# Patient Record
Sex: Female | Born: 1935
Health system: Southern US, Community
[De-identification: ages and names within clinical notes are randomized; demographics above are authoritative.]

## PROBLEM LIST (undated history)

## (undated) DIAGNOSIS — R17 Unspecified jaundice: Secondary | ICD-10-CM

## (undated) DIAGNOSIS — K831 Obstruction of bile duct: Secondary | ICD-10-CM

## (undated) DIAGNOSIS — R634 Abnormal weight loss: Secondary | ICD-10-CM

## (undated) DIAGNOSIS — K219 Gastro-esophageal reflux disease without esophagitis: Secondary | ICD-10-CM

## (undated) DIAGNOSIS — I509 Heart failure, unspecified: Secondary | ICD-10-CM

## (undated) DIAGNOSIS — E039 Hypothyroidism, unspecified: Secondary | ICD-10-CM

## (undated) DIAGNOSIS — D869 Sarcoidosis, unspecified: Secondary | ICD-10-CM

## (undated) DIAGNOSIS — R630 Anorexia: Secondary | ICD-10-CM

## (undated) HISTORY — DX: Abnormal weight loss: R63.4

## (undated) HISTORY — DX: Unspecified jaundice: R17

## (undated) HISTORY — PX: APPENDECTOMY: SHX54

## (undated) HISTORY — DX: Obstruction of bile duct: K83.1

## (undated) HISTORY — DX: Anorexia: R63.0

## (undated) HISTORY — PX: VAGINA SURGERY: SHX829

---

## 2010-10-07 ENCOUNTER — Other Ambulatory Visit (INDEPENDENT_AMBULATORY_CARE_PROVIDER_SITE_OTHER): Payer: Self-pay | Admitting: Internal Medicine

## 2010-10-07 ENCOUNTER — Ambulatory Visit (HOSPITAL_COMMUNITY)
Admission: RE | Admit: 2010-10-07 | Discharge: 2010-10-07 | Disposition: A | Discharge status: Home / Self Care | Payer: Medicare Other | Source: Ambulatory Visit | Attending: Internal Medicine | Admitting: Internal Medicine

## 2010-10-07 ENCOUNTER — Ambulatory Visit (INDEPENDENT_AMBULATORY_CARE_PROVIDER_SITE_OTHER): Payer: Medicare Other | Admitting: Internal Medicine

## 2010-10-07 ENCOUNTER — Encounter (HOSPITAL_COMMUNITY): Payer: Medicare Other

## 2010-10-07 DIAGNOSIS — Z01811 Encounter for preprocedural respiratory examination: Secondary | ICD-10-CM

## 2010-10-07 DIAGNOSIS — K839 Disease of biliary tract, unspecified: Secondary | ICD-10-CM

## 2010-10-07 LAB — HEPATIC FUNCTION PANEL
AST: 255 U/L — ABNORMAL HIGH (ref 0–37)
Bilirubin, Direct: 3.2 mg/dL — ABNORMAL HIGH (ref 0.0–0.3)
Total Bilirubin: 4.5 mg/dL — ABNORMAL HIGH (ref 0.3–1.2)

## 2010-10-07 LAB — PROTIME-INR: Prothrombin Time: 14.2 seconds (ref 11.6–15.2)

## 2010-10-08 ENCOUNTER — Ambulatory Visit (HOSPITAL_COMMUNITY)
Admission: RE | Admit: 2010-10-08 | Discharge: 2010-10-08 | Disposition: A | Discharge status: Home / Self Care | Payer: Medicare Other | Source: Ambulatory Visit | Attending: Internal Medicine | Admitting: Internal Medicine

## 2010-10-08 ENCOUNTER — Other Ambulatory Visit (HOSPITAL_BASED_OUTPATIENT_CLINIC_OR_DEPARTMENT_OTHER): Payer: Medicare Other | Admitting: Internal Medicine

## 2010-10-08 ENCOUNTER — Ambulatory Visit (HOSPITAL_COMMUNITY): Payer: Medicare Other

## 2010-10-08 DIAGNOSIS — J449 Chronic obstructive pulmonary disease, unspecified: Secondary | ICD-10-CM | POA: Insufficient documentation

## 2010-10-08 DIAGNOSIS — J4489 Other specified chronic obstructive pulmonary disease: Secondary | ICD-10-CM | POA: Insufficient documentation

## 2010-10-08 DIAGNOSIS — K831 Obstruction of bile duct: Secondary | ICD-10-CM

## 2010-10-08 DIAGNOSIS — R110 Nausea: Secondary | ICD-10-CM | POA: Insufficient documentation

## 2010-10-08 DIAGNOSIS — E119 Type 2 diabetes mellitus without complications: Secondary | ICD-10-CM | POA: Insufficient documentation

## 2010-10-08 DIAGNOSIS — K838 Other specified diseases of biliary tract: Secondary | ICD-10-CM

## 2010-10-08 DIAGNOSIS — Z01812 Encounter for preprocedural laboratory examination: Secondary | ICD-10-CM | POA: Insufficient documentation

## 2010-10-08 DIAGNOSIS — R17 Unspecified jaundice: Secondary | ICD-10-CM | POA: Insufficient documentation

## 2010-10-08 HISTORY — PX: ERCP: SHX60

## 2010-11-01 ENCOUNTER — Ambulatory Visit (INDEPENDENT_AMBULATORY_CARE_PROVIDER_SITE_OTHER): Payer: Medicare Other | Admitting: Internal Medicine

## 2010-11-01 DIAGNOSIS — K831 Obstruction of bile duct: Secondary | ICD-10-CM

## 2010-11-01 NOTE — Consult Note (Signed)
NAMESEQUOYAH, RAMONE                ACCOUNT NO.:  [id number]  MEDICAL RECORD NO.:  [id number]  LOCATION:                                 FACILITY:  PHYSICIAN:  Lionel December, M.D.    DATE OF BIRTH:  08-04-1929  DATE OF CONSULTATION:  10/07/2010 DATE OF DISCHARGE:                                CONSULTATION   REASON FOR CONSULTATION:  Obstructive jaundice.  HISTORY OF PRESENT ILLNESS:  Piya is a 77 year old Caucasian female who is referred through courtesy of Dr. Donzetta Sprung for evaluation of obstructive jaundice.  She is accompanied by her son, Everline Mahaffy and daughter-in-law, Orah Sonnen, who is an Charity fundraiser at [employer].  The patient's present illness began about 3 weeks ago when she fell. Since then, she has had some few good days but a lot of bad days. Initially, she was on a pain medication but she has stopped the Percocet and then she was on tramadol and she has discontinued tramadol as well. She has been experiencing nausea and belching and she has had multiple episodes of heaving.  She did notice some vague abdominal pain in the right lower quadrant.  She also has noted rumbling and rolling of her abdomen.  A few days ago, her urine was dark, but now is back to normal. She says her stool has been greenish color.  She has noted drop in her appetite and in the last 3 weeks she has lost 6 pounds.  She has not experienced any fever or chills.  She also denies melena or rectal bleeding.  The patient was seen by Dr. Donzetta Sprung on Sep 29, 2010 and she was noted to be jaundiced.  He therefore did lab studies and noted that she had elevated bilirubin, alkaline phosphatase, and transaminases.  She had abdominopelvic CT on October 06, 2010, which revealed markedly dilated biliary system which is reviewed below.  She also had chest CT yesterday.  Dr. Reuel Boom felt that she needed urgent evaluation for possible ERCP.  The patient states that she has been feeling very weak.  She says she has  been very active until this fall.  She did not experience any fracture though.  She did have x-rays and noted to have fracture to one of her vertebra, but this was felt to be old.  CURRENT MEDICATIONS: 1. Glucotrol XL 5 mg p.o. q.a.m. 2. Synthroid 75 mcg p.o. daily. 3. Voltaren gel to be applied to her back p.r.n. 4. Tylenol OTC p.r.n. 5. Pepto-Bismol p.r.n.Marland Kitchen  PAST MEDICAL HISTORY:  Hypothyroidism was diagnosed about 40 years ago. She was diagnosed with pulmonary sarcoidosis 15-20 years, has remained in remission.  She has been diabetic for about 5 years.  She has osteoporosis, presently on calcium and vitamin D which she is not taking at the present time.  She fell and sustained pelvic fracture 20 years ago but did not require any surgery.  Somewhere along the line, she also developed fracture to superior endplate of T12 which was not seen on a chest CT of January 07, 2011.  PAST SURGICAL HISTORY:  Suspension of her uterus when she was 77 years old.  ALLERGIES:  SULFA causing rash.  FAMILY HISTORY:  Mother lived to be 45 and father died of stroke in his 53s.  She has a sister age 58, is diabetic and hypertensive and in fair condition.  Three sisters are diseased, one had breast carcinoma diagnosed in her 30s but lived to be 32.  Two other sisters lived to be in their 22s.  She has a brother aged 22 who is diabetic.  Another brother died in his 1s.  He was diabetic and had a stroke.  SOCIAL HISTORY:  She is married.  She has 3 children.  Two were born premature and died.  The ones who are living generally are doing well. One of her sons, [name], is here with her today.  She works at [employer] for 30 years, but she has been retired for few years.  She has never smoked cigarettes or drank alcohol.  OBJECTIVE:  VITAL SIGNS:  Weight 96 pounds.  She is 5 feet tall.  Pulse 68 per minute, blood pressure 118/70, temperature is 97.6. HEENT:  Conjunctivae are pink.  Sclerae  are mildly icteric.  Oral pharyngeal mucosa is normal.  She has dentures in place.  Her hard palate which is hidden by dentures is icteric. NECK:  No neck masses or thyromegaly noted. CARDIAC:  Regular rhythm.  Normal S1 and S2. LUNGS:  Clear to auscultation.  She has chest deformity with an increased AP diameter. ABDOMEN:  Flat.  Bowel sounds are hyperactive.  She has abdominal bruit. Liver edge is 7-8 cm below right costal margin.  Gallbladder is palpated separate from the margin, span is 14 cm.  Spleen is not palpable. EXTREMITIES:  Thin, but no clubbing or edema noted.  LABORATORY DATA:  From Sep 29, 2010; WBC 5.7, H and H 13.4 and 41.3, MCV 95, and platelet count 436,000.  Serum amylase 18, lipase 9, glucose 256, BUN 7, creatinine 0.77.  Sodium 133, potassium 4.4, chloride 90, CO2 of 25, calcium 9.4.  Total bilirubin 4.2, AP 453, SGOT 44, SGPT 68, total protein 7.3 with albumin of 4.0.  Abdominopelvic CT with contrast performed at [facility] on October 06, 2010.  It shows very large intrahepatic biliary radicals as well as dilated common hepatic duct and common bile duct down to level of ampulla.  Gallbladder is also markedly distended.  Pancreas is atrophic, but there is no definite mass identified.  This study also showed large hiatal hernia, bilateral renal cysts, old T2 compression fracture, and diverticulosis.  The patient had preop chest film today which shows extensive pulmonary distortion in the upper lobes with elevation left hilum and nodular linear opacities.  These changes are felt to be due to history of sarcoidosis.  ASSESSMENT:  Yelina is a 77 year old Caucasian female who presents with 3- week history of nausea, anorexia, heaving, and 6-pound weight loss and she also developed dark urine.  She has not experienced any fever, chills, or pruritus.  She did have vague abdominal pain but now is pain- free.  Lab studies revealed that she has elevated bilirubin,  alkaline phosphatase, and transaminases.  Pattern is cholestatic.  She has markedly dilated intra and extrahepatic biliary system down to the level of ampulla.  There is no mass and head of pancreas which appears atrophic.  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  However, I would favor the first diagnosis considering that her bilirubin is not very high as one  would expect with a malignant process where obstruction generally becomes complete.  RECOMMENDATIONS:  Preop evaluation at [facility] (already done).  She will undergo ERCP with sphincterotomy and biliary stenting if indicated and she may also need cholangioscopy or SpyGlass exam.  I have reviewed the procedure and risks in detail with the patient and her son and daughter-in-law and that they are all in agreement.  We will repeat her LFTs and check her INR.  She will also be given antimicrobial prophylaxis with ampicillin and Cipro.  ERCP will be performed on October 08, 2010.  Further recommendations to follow.  We appreciate the opportunity to participate in the care of this nice lady.          ______________________________ Lionel December, M.D.     NR/MEDQ  D:  10/07/2010  T:  10/08/2010  Job:  [id number]  cc:   Donzetta Sprung, MD Fax: [phone]  Electronically Signed by Lionel December M.D. on 11/01/2010 12:27:33 AM

## 2010-11-01 NOTE — Op Note (Signed)
NAMEBELLINA, TOKARCZYK                ACCOUNT NO.:  [id number]  MEDICAL RECORD NO.:  [id number]  LOCATION:  [facility]                          FACILITY:  [facility]  PHYSICIAN:  Lionel December, M.D.    DATE OF BIRTH:  1930/02/20  DATE OF PROCEDURE:  10/08/2010 DATE OF DISCHARGE:                              OPERATIVE REPORT   PROCEDURE:  ERCP with biliary sphincterotomy, cholangioscopy, and placement of biliary stent.  INDICATIONS:  Conny is 77 year old Caucasian female who presents with nausea, anorexia, weight loss, jaundice, and she is noted to have markedly dilated biliary system.  No mass identified and atrophic- appearing pancreas.  Procedure risks were reviewed with the patient and also talked with her son and daughter-in-law and they were all in agreement.  MEDS FOR SEDATION/ANESTHESIA:  Please see anesthesia records for details.  FINDINGS:  Procedure performed in the OR.  Once the patient was placed under anesthesia intubated, she was turned into semi-prone position and therapeutic Pentax video duodenoscope was passed through oropharynx without any difficulty into esophagus and across the stomach and pylorus into bulb and descending duodenum.  A very prominent ampulla Vater with fairly long intramural segment and a periampullary diverticulum on the right side.  Ampullary orifice was very tiny, was small.  CBD was cannulated with the help of the guide wire 0.35 Hydra Jagwire and RX44 autotome.  Dilute contrast was injected into the bile duct.  CBD and CHD were very dilated.  Maximal diameter was about 40 mm.  Some of the contrast spilled into cystic duct which was also quite large and convoluted.  Intrahepatic biliary radicals were only partially filled. Sphincterotomy was performed with gush of dark bile, mucopurulent material, as well as small pigment stone.  Distal segment of bile duct did not fill very well.  Therefore, cholangioscopy was performed using SpyGlass system.  Part  of the common hepatic and bile duct that was seen appeared to be normal, but visualization was suboptimal because of the opaque fluid and markedly dilated system.  I was not able to see the distal most segment of the duct.  Therefore proceeded with placement of biliary stent.  Using one-step Microvasive system, 10-French 7 cm long stent was deployed into the bile duct.  As the stent was disengaged, there was flow of fluid and contrast in the duodenum.  Endoscope was withdrawn.  The patient tolerated the procedure well.  She was extubated and taken to PACU.  FINAL DIAGNOSES: 1. Markedly dilated biliary system with patent cystic duct.  Distal     segment of the bile duct was not well seen.  Biliary sphincterotomy     performed with escape of mucopurulent and black bowel as well as     pigment stone. 2. Distal end of the bile duct not well seen with cholangioscopy.     Biliary stent left in place for decompression. 3. Suspect, she has papillary stenosis, although I cannot rule out the     small tumor in the distal bile duct.  RECOMMENDATIONS: 1. Clear liquids today.  She will resume her usual diet and meds in     a.m. 2. Cipro 500 mg p.o. b.i.d. for 5 days.  Please note she was also     given ampicillin and Cipro. 3. Zofran 4 mg p.o. b.i.d. p.r.n. for nausea, prescription given for     12. 4. The patient advised to watch for the stent and if she passes     spontaneously, she will let us know.  She will return for office visit in 4 weeks and she will have LFTs prior to that visit.          ______________________________ Lionel December, M.D.     NR/MEDQ  D:  10/08/2010  T:  10/08/2010  Job:  [id number]  cc:   Donzetta Sprung, MD Fax: [phone]  Electronically Signed by Lionel December M.D. on 11/01/2010 12:27:10 AM

## 2010-11-18 ENCOUNTER — Encounter (INDEPENDENT_AMBULATORY_CARE_PROVIDER_SITE_OTHER): Payer: Self-pay | Admitting: Internal Medicine

## 2010-11-18 ENCOUNTER — Telehealth (INDEPENDENT_AMBULATORY_CARE_PROVIDER_SITE_OTHER): Payer: Self-pay | Admitting: Internal Medicine

## 2010-11-18 NOTE — Telephone Encounter (Deleted)
Gastroenterology Pre-Procedure Form  Misty [name] is a 77 y.o., female who was referred to our office by Dr. Marland Kitchen on *** for a(n) {PROCEDURES; GI SINGULAR:210111023}.   The reason for this referral is {GI COLONOSCOPY INDICATIONS:110011}  Patient responded to the following questions as indicated:   1. Are you having any problems now? {response; yes (wildcard)/no:311194} 2. Do you have any family history of colon cancer? {response; yes (wildcard)/no:311194} 3. Have you ever had a colonoscopy before? {response; yes (wildcard)/no:311194}  Patient {Is/is not:9024} appropriate to continue this phone triage screening for the requested procedure(s).    ***

## 2010-11-18 NOTE — Telephone Encounter (Signed)
A user error has taken place: encounter opened in error, closed for administrative reasons.

## 2010-11-29 ENCOUNTER — Other Ambulatory Visit (INDEPENDENT_AMBULATORY_CARE_PROVIDER_SITE_OTHER): Payer: Self-pay | Admitting: Internal Medicine

## 2010-11-29 ENCOUNTER — Ambulatory Visit (HOSPITAL_COMMUNITY)
Admission: RE | Admit: 2010-11-29 | Discharge: 2010-11-29 | Disposition: A | Discharge status: Home / Self Care | Payer: Medicare Other | Source: Ambulatory Visit | Attending: Internal Medicine | Admitting: Internal Medicine

## 2010-11-29 DIAGNOSIS — Z09 Encounter for follow-up examination after completed treatment for conditions other than malignant neoplasm: Secondary | ICD-10-CM | POA: Insufficient documentation

## 2010-11-29 DIAGNOSIS — K831 Obstruction of bile duct: Secondary | ICD-10-CM

## 2010-12-22 ENCOUNTER — Encounter (INDEPENDENT_AMBULATORY_CARE_PROVIDER_SITE_OTHER): Payer: Self-pay

## 2010-12-30 ENCOUNTER — Other Ambulatory Visit (INDEPENDENT_AMBULATORY_CARE_PROVIDER_SITE_OTHER): Payer: Self-pay | Admitting: Internal Medicine

## 2010-12-30 ENCOUNTER — Ambulatory Visit (HOSPITAL_COMMUNITY)
Admission: RE | Admit: 2010-12-30 | Discharge: 2010-12-30 | Disposition: A | Discharge status: Home / Self Care | Payer: Medicare Other | Source: Ambulatory Visit | Attending: Internal Medicine | Admitting: Internal Medicine

## 2010-12-30 DIAGNOSIS — Z09 Encounter for follow-up examination after completed treatment for conditions other than malignant neoplasm: Secondary | ICD-10-CM | POA: Insufficient documentation

## 2010-12-30 DIAGNOSIS — Z955 Presence of coronary angioplasty implant and graft: Secondary | ICD-10-CM

## 2010-12-30 DIAGNOSIS — Z4682 Encounter for fitting and adjustment of non-vascular catheter: Secondary | ICD-10-CM | POA: Insufficient documentation

## 2011-01-05 ENCOUNTER — Telehealth (INDEPENDENT_AMBULATORY_CARE_PROVIDER_SITE_OTHER): Payer: Self-pay | Admitting: *Deleted

## 2011-01-05 DIAGNOSIS — T85590A Other mechanical complication of bile duct prosthesis, initial encounter: Secondary | ICD-10-CM

## 2011-01-05 NOTE — Telephone Encounter (Addendum)
Per Dr Karilyn Cota patient need ERCP w/ stent removal  ERCP sch'd 02/16/11 @ 7:30 (6:30)  Pre-op visit 02/09/11 @ 11:00  Patient's daughter-in-law, Olegario Messier, is aware of appts

## 2011-01-31 ENCOUNTER — Other Ambulatory Visit (INDEPENDENT_AMBULATORY_CARE_PROVIDER_SITE_OTHER): Payer: Self-pay | Admitting: *Deleted

## 2011-01-31 DIAGNOSIS — K831 Obstruction of bile duct: Secondary | ICD-10-CM

## 2011-02-01 MED ORDER — SODIUM CHLORIDE 0.9 % IV SOLN: 2.0000 g | Freq: Four times a day (QID) | INTRAVENOUS | Status: DC

## 2011-02-07 ENCOUNTER — Telehealth (INDEPENDENT_AMBULATORY_CARE_PROVIDER_SITE_OTHER): Payer: Self-pay | Admitting: *Deleted

## 2011-02-07 NOTE — Telephone Encounter (Signed)
Misty [name] has called back and wants to ask again what is about the IV antix.  The hospital called her Friday and asked her if she was on an IV antibx.  Also she wanted to know if she could do a abd xray to check on Wednesday when she goes for Pre-op to see if she has passed the stent before she has this procedure.  Really hopes she has and won't need this procedure.

## 2011-02-07 NOTE — Telephone Encounter (Signed)
Pharmacy called to get patient's med list prior to pre-op, spokt to Dr Karilyn Cota & he is agreeable for patient to have abd xray to check stent placement prior to pre-op, Lynden Ang is aware

## 2011-02-07 NOTE — Telephone Encounter (Signed)
He called Friday and said that the [facility] had called and asked about her being on an antibx.

## 2011-02-08 ENCOUNTER — Ambulatory Visit (HOSPITAL_COMMUNITY)
Admission: RE | Admit: 2011-02-08 | Discharge: 2011-02-08 | Disposition: A | Discharge status: Home / Self Care | Payer: Medicare Other | Source: Ambulatory Visit | Attending: Internal Medicine | Admitting: Internal Medicine

## 2011-02-08 ENCOUNTER — Other Ambulatory Visit (INDEPENDENT_AMBULATORY_CARE_PROVIDER_SITE_OTHER): Payer: Self-pay | Admitting: Internal Medicine

## 2011-02-08 DIAGNOSIS — T85590A Other mechanical complication of bile duct prosthesis, initial encounter: Secondary | ICD-10-CM

## 2011-02-08 DIAGNOSIS — Z4682 Encounter for fitting and adjustment of non-vascular catheter: Secondary | ICD-10-CM | POA: Insufficient documentation

## 2011-02-08 DIAGNOSIS — Z09 Encounter for follow-up examination after completed treatment for conditions other than malignant neoplasm: Secondary | ICD-10-CM | POA: Insufficient documentation

## 2011-02-09 ENCOUNTER — Encounter (HOSPITAL_COMMUNITY): Payer: Self-pay

## 2011-02-09 ENCOUNTER — Encounter (HOSPITAL_COMMUNITY)
Admission: RE | Admit: 2011-02-09 | Discharge: 2011-02-09 | Disposition: A | Discharge status: Home / Self Care | Payer: Medicare Other | Source: Ambulatory Visit | Attending: Internal Medicine | Admitting: Internal Medicine

## 2011-02-09 HISTORY — DX: Hypothyroidism, unspecified: E03.9

## 2011-02-09 HISTORY — DX: Sarcoidosis, unspecified: D86.9

## 2011-02-09 HISTORY — DX: Gastro-esophageal reflux disease without esophagitis: K21.9

## 2011-02-09 LAB — BASIC METABOLIC PANEL
BUN: 17 mg/dL (ref 6–23)
CO2: 32 mEq/L (ref 19–32)
Calcium: 10.1 mg/dL (ref 8.4–10.5)
Creatinine, Ser: 0.77 mg/dL (ref 0.50–1.10)
Glucose, Bld: 176 mg/dL — ABNORMAL HIGH (ref 70–99)

## 2011-02-09 LAB — CBC
Hemoglobin: 13.8 g/dL (ref 12.0–15.0)
MCH: 30.9 pg (ref 26.0–34.0)
MCHC: 33.3 g/dL (ref 30.0–36.0)
MCV: 92.8 fL (ref 78.0–100.0)
RBC: 4.47 MIL/uL (ref 3.87–5.11)

## 2011-02-09 NOTE — Patient Instructions (Signed)
20 Misty [name]  02/09/2011   Your procedure is scheduled on: 02/16/11  Report to [facility] [place] AM.  Call this number if you have problems the morning of surgery: [phone]   Remember:   Do not eat food:After Midnight.  Do not drink clear liquids: After Midnight.  Take these medicines the morning of surgery with A SIP OF WATER: synthroid   Do not wear jewelry, make-up or nail polish.  Do not wear lotions, powders, or perfumes. You may wear deodorant.  Do not shave 48 hours prior to surgery.  Do not bring valuables to the hospital.  Contacts, dentures or bridgework may not be worn into surgery.  Leave suitcase in the car. After surgery it may be brought to your room.  For patients admitted to the hospital, checkout time is 11:00 AM the day of discharge.   Patients discharged the day of surgery will not be allowed to drive home.  Name and phone number of your driver: family  Special Instructions: N/A   Please read over the following fact sheets that you were given: Pain Booklet, Anesthesia Post-op Instructions and Care and Recovery After Surgery   PATIENT INSTRUCTIONS POST-ANESTHESIA  IMMEDIATELY FOLLOWING SURGERY:  Do not drive or operate machinery for the first twenty four hours after surgery.  Do not make any important decisions for twenty four hours after surgery or while taking narcotic pain medications or sedatives.  If you develop intractable nausea and vomiting or a severe headache please notify your doctor immediately.  FOLLOW-UP:  Please make an appointment with your surgeon as instructed. You do not need to follow up with anesthesia unless specifically instructed to do so.  WOUND CARE INSTRUCTIONS (if applicable):  Keep a dry clean dressing on the anesthesia/puncture wound site if there is drainage.  Once the wound has quit draining you may leave it open to air.  Generally you should leave the bandage intact for twenty four hours unless there is drainage.  If the  epidural site drains for more than 36-48 hours please call the anesthesia department.  QUESTIONS?:  Please feel free to call your physician or the hospital operator if you have any questions, and they will be happy to assist you.     [facility] Anesthesia Department [address] [place] [phone]

## 2011-02-15 ENCOUNTER — Other Ambulatory Visit (INDEPENDENT_AMBULATORY_CARE_PROVIDER_SITE_OTHER): Payer: Self-pay | Admitting: *Deleted

## 2011-02-15 DIAGNOSIS — K831 Obstruction of bile duct: Secondary | ICD-10-CM

## 2011-02-16 ENCOUNTER — Encounter (HOSPITAL_COMMUNITY)
Admission: RE | Disposition: A | Discharge status: Home / Self Care | Payer: Self-pay | Source: Ambulatory Visit | Attending: Internal Medicine

## 2011-02-16 ENCOUNTER — Ambulatory Visit (HOSPITAL_COMMUNITY): Payer: Medicare Other

## 2011-02-16 ENCOUNTER — Ambulatory Visit (HOSPITAL_COMMUNITY): Payer: Medicare Other | Admitting: Anesthesiology

## 2011-02-16 ENCOUNTER — Encounter (HOSPITAL_COMMUNITY): Payer: Self-pay | Admitting: Anesthesiology

## 2011-02-16 ENCOUNTER — Ambulatory Visit (HOSPITAL_COMMUNITY)
Admission: RE | Admit: 2011-02-16 | Discharge: 2011-02-16 | Disposition: A | Discharge status: Home / Self Care | Payer: Medicare Other | Source: Ambulatory Visit | Attending: Internal Medicine | Admitting: Internal Medicine

## 2011-02-16 DIAGNOSIS — K8050 Calculus of bile duct without cholangitis or cholecystitis without obstruction: Secondary | ICD-10-CM | POA: Insufficient documentation

## 2011-02-16 DIAGNOSIS — K838 Other specified diseases of biliary tract: Secondary | ICD-10-CM | POA: Insufficient documentation

## 2011-02-16 DIAGNOSIS — Z01810 Encounter for preprocedural cardiovascular examination: Secondary | ICD-10-CM | POA: Insufficient documentation

## 2011-02-16 DIAGNOSIS — K831 Obstruction of bile duct: Secondary | ICD-10-CM

## 2011-02-16 DIAGNOSIS — Z01812 Encounter for preprocedural laboratory examination: Secondary | ICD-10-CM | POA: Insufficient documentation

## 2011-02-16 DIAGNOSIS — E119 Type 2 diabetes mellitus without complications: Secondary | ICD-10-CM | POA: Insufficient documentation

## 2011-02-16 HISTORY — PX: ERCP: SHX5425

## 2011-02-16 HISTORY — PX: SPHINCTEROTOMY: SHX5544

## 2011-02-16 HISTORY — PX: BILIARY STENT PLACEMENT: SHX5538

## 2011-02-16 SURGERY — ENDOSCOPIC RETROGRADE CHOLANGIOPANCREATOGRAPHY (ERCP)
Anesthesia: General

## 2011-02-16 MED ORDER — GLYCOPYRROLATE 0.2 MG/ML IJ SOLN
0.2000 mg | Freq: Once | INTRAMUSCULAR | Status: AC
  Administered 2011-02-16: 0.2 mg via INTRAVENOUS

## 2011-02-16 MED ORDER — MIDAZOLAM HCL 2 MG/2ML IJ SOLN
INTRAMUSCULAR | Status: AC
  Administered 2011-02-16: 2 mg via INTRAVENOUS
  Filled 2011-02-16: qty 2

## 2011-02-16 MED ORDER — ETOMIDATE 2 MG/ML IV SOLN
INTRAVENOUS | Status: AC
  Filled 2011-02-16: qty 10

## 2011-02-16 MED ORDER — FENTANYL CITRATE 0.05 MG/ML IJ SOLN: 25.0000 ug | INTRAMUSCULAR | Status: DC | PRN

## 2011-02-16 MED ORDER — SODIUM CHLORIDE 0.9 % IR SOLN: Status: DC | PRN

## 2011-02-16 MED ORDER — ETOMIDATE 2 MG/ML IV SOLN
INTRAVENOUS | Status: DC | PRN
  Administered 2011-02-16: 10 mg via INTRAVENOUS

## 2011-02-16 MED ORDER — ROCURONIUM BROMIDE 100 MG/10ML IV SOLN
INTRAVENOUS | Status: DC | PRN
  Administered 2011-02-16: 5 mg via INTRAVENOUS

## 2011-02-16 MED ORDER — GLYCOPYRROLATE 0.2 MG/ML IJ SOLN
INTRAMUSCULAR | Status: AC
  Administered 2011-02-16: 0.2 mg via INTRAVENOUS
  Filled 2011-02-16: qty 1

## 2011-02-16 MED ORDER — FENTANYL CITRATE 0.05 MG/ML IJ SOLN
INTRAMUSCULAR | Status: DC | PRN
  Administered 2011-02-16 (×2): 50 ug via INTRAVENOUS

## 2011-02-16 MED ORDER — URSODIOL 250 MG PO TABS: 250.0000 mg | ORAL_TABLET | Freq: Three times a day (TID) | ORAL | Status: DC

## 2011-02-16 MED ORDER — SODIUM CHLORIDE 0.9 % IV SOLN
2.0000 g | Freq: Once | INTRAVENOUS | Status: AC
  Administered 2011-02-16: 2 g via INTRAVENOUS

## 2011-02-16 MED ORDER — FENTANYL CITRATE 0.05 MG/ML IJ SOLN
INTRAMUSCULAR | Status: AC
  Filled 2011-02-16: qty 2

## 2011-02-16 MED ORDER — ONDANSETRON HCL 4 MG/2ML IJ SOLN
4.0000 mg | Freq: Once | INTRAMUSCULAR | Status: AC
  Administered 2011-02-16: 4 mg via INTRAVENOUS

## 2011-02-16 MED ORDER — ONDANSETRON HCL 4 MG/2ML IJ SOLN
4.0000 mg | Freq: Once | INTRAMUSCULAR | Status: AC | PRN
  Administered 2011-02-16: 4 mg via INTRAVENOUS

## 2011-02-16 MED ORDER — SUCCINYLCHOLINE CHLORIDE 20 MG/ML IJ SOLN
INTRAMUSCULAR | Status: DC | PRN
  Administered 2011-02-16: 80 mg via INTRAVENOUS

## 2011-02-16 MED ORDER — LIDOCAINE HCL 1 % IJ SOLN
INTRAMUSCULAR | Status: DC | PRN
  Administered 2011-02-16: 30 mg via INTRADERMAL

## 2011-02-16 MED ORDER — SODIUM CHLORIDE 0.45 % IV SOLN: Freq: Once | INTRAVENOUS | Status: DC

## 2011-02-16 MED ORDER — LACTATED RINGERS IV SOLN
INTRAVENOUS | Status: DC
  Administered 2011-02-16: 1000 mL via INTRAVENOUS

## 2011-02-16 MED ORDER — SODIUM CHLORIDE 0.9 % IV SOLN
INTRAVENOUS | Status: DC | PRN
  Administered 2011-02-16: 50 mL/h via INTRAVENOUS

## 2011-02-16 MED ORDER — SIMETHICONE 40 MG/0.6ML PO SUSP
ORAL | Status: DC | PRN
  Administered 2011-02-16: 08:00:00

## 2011-02-16 MED ORDER — ONDANSETRON HCL 4 MG/2ML IJ SOLN
INTRAMUSCULAR | Status: AC
  Administered 2011-02-16: 4 mg via INTRAVENOUS
  Filled 2011-02-16: qty 2

## 2011-02-16 MED ORDER — PROPOFOL 10 MG/ML IV EMUL
INTRAVENOUS | Status: AC
  Filled 2011-02-16: qty 20

## 2011-02-16 MED ORDER — CIPROFLOXACIN IN D5W 400 MG/200ML IV SOLN: 400.0000 mg | INTRAVENOUS | Status: DC

## 2011-02-16 MED ORDER — MIDAZOLAM HCL 2 MG/2ML IJ SOLN
1.0000 mg | INTRAMUSCULAR | Status: DC | PRN
  Administered 2011-02-16: 2 mg via INTRAVENOUS

## 2011-02-16 MED ORDER — CIPROFLOXACIN IN D5W 400 MG/200ML IV SOLN
INTRAVENOUS | Status: AC
  Filled 2011-02-16: qty 200

## 2011-02-16 MED ORDER — ROCURONIUM BROMIDE 50 MG/5ML IV SOLN
INTRAVENOUS | Status: AC
  Filled 2011-02-16: qty 1

## 2011-02-16 MED ORDER — IOHEXOL 350 MG/ML SOLN
INTRAVENOUS | Status: DC | PRN
  Administered 2011-02-16: 50 mL via INTRAVENOUS

## 2011-02-16 MED ORDER — CIPROFLOXACIN IN D5W 400 MG/200ML IV SOLN
INTRAVENOUS | Status: DC | PRN
  Administered 2011-02-16: 400 mg via INTRAVENOUS

## 2011-02-16 MED ORDER — SUCCINYLCHOLINE CHLORIDE 20 MG/ML IJ SOLN
INTRAMUSCULAR | Status: AC
  Filled 2011-02-16: qty 1

## 2011-02-16 MED ORDER — SODIUM CHLORIDE 0.9 % IV SOLN
INTRAVENOUS | Status: AC
  Administered 2011-02-16: 2 g via INTRAVENOUS
  Filled 2011-02-16: qty 2000

## 2011-02-16 MED ORDER — ONDANSETRON HCL 4 MG/2ML IJ SOLN
INTRAMUSCULAR | Status: AC
  Filled 2011-02-16: qty 8

## 2011-02-16 SURGICAL SUPPLY — 16 items
BALLN RETRIEVAL 12X15 (BALLOONS) ×2 IMPLANT
DEVICE INFLATION ENCORE 26 (MISCELLANEOUS) ×2 IMPLANT
DEVICE LOCKING W-BIOPSY CAP (MISCELLANEOUS) ×2 IMPLANT
ELECT REM PT RETURN 9FT ADLT (ELECTROSURGICAL) ×2
ELECTRODE REM PT RTRN 9FT ADLT (ELECTROSURGICAL) ×1 IMPLANT
KIT ROOM TURNOVER APOR (KITS) ×2 IMPLANT
LUBRICANT JELLY 4.5OZ STERILE (MISCELLANEOUS) ×2 IMPLANT
NEEDLE HYPO 18GX1.5 BLUNT FILL (NEEDLE) ×2 IMPLANT
PAD ARMBOARD 7.5X6 YLW CONV (MISCELLANEOUS) ×2 IMPLANT
SNARE ROTATE MED OVAL 20MM (MISCELLANEOUS) ×2 IMPLANT
SPHINCTEROTOME HYDRATOME 44 (MISCELLANEOUS) ×4 IMPLANT
SPONGE GAUZE 4X4 12PLY (GAUZE/BANDAGES/DRESSINGS) ×2 IMPLANT
SYR 50ML LL SCALE MARK (SYRINGE) ×2 IMPLANT
SYSTEM CONTINUOUS INJECTION (MISCELLANEOUS) ×2 IMPLANT
TUBING ENDO SMARTCAP PENTAX (MISCELLANEOUS) ×2 IMPLANT
WATER STERILE IRR 1000ML POUR (IV SOLUTION) ×4 IMPLANT

## 2011-02-16 NOTE — Anesthesia Procedure Notes (Signed)
Procedure Name: Intubation Date/Time: 02/16/2011 7:55 AM Performed by: Glynn Octave Pre-anesthesia Checklist: Patient identified, Patient being monitored, Timeout performed, Emergency Drugs available and Suction available Patient Re-evaluated:Patient Re-evaluated prior to inductionOxygen Delivery Method: Circle System Utilized Preoxygenation: Pre-oxygenation with 100% oxygen Intubation Type: IV induction Ventilation: Mask ventilation without difficulty Laryngoscope Size: Mac and 3 Grade View: Grade II Tube type: Oral Tube size: 7.0 mm Number of attempts: 1 Airway Equipment and Method: stylet Placement Confirmation: ETT inserted through vocal cords under direct vision,  positive ETCO2 and breath sounds checked- equal and bilateral Secured at: 21 cm Tube secured with: Tape Dental Injury: Teeth and Oropharynx as per pre-operative assessment

## 2011-02-16 NOTE — Anesthesia Postprocedure Evaluation (Signed)
  Anesthesia Post-op Note  Patient: Misty [name]  Procedure(s) Performed:  ENDOSCOPIC RETROGRADE CHOLANGIOPANCREATOGRAPHY (ERCP) - 7:30/with Stent Removal; BILIARY STENT PLACEMENT; SPHINCTEROTOMY  Patient Location: PACU  Anesthesia Type: General  Level of Consciousness: awake, alert  and oriented  Airway and Oxygen Therapy: Patient Spontanous Breathing  Post-op Pain: none  Post-op Assessment: Post-op Vital signs reviewed, Patient's Cardiovascular Status Stable and Respiratory Function Stable  Post-op Vital Signs: Reviewed and stable  Complications: No apparent anesthesia complications

## 2011-02-16 NOTE — Anesthesia Preprocedure Evaluation (Addendum)
Anesthesia Evaluation  Name, MR# and DOB Patient awake  General Assessment Comment  Reviewed: Allergy & Precautions, H&P , NPO status , Patient's Chart, lab work & pertinent test results  History of Anesthesia Complications Negative for: history of anesthetic complications  Airway Mallampati: I      Dental  (+) Edentulous Upper and Edentulous Lower   Pulmonary COPD (hx sarcoidosis)   Pulmonary exam normal       Cardiovascular Regular Normal    Neuro/Psych    GI/Hepatic   Endo/Other  Diabetes mellitus-, Well Controlled, Type 2, Oral Hypoglycemic AgentsHypothyroidism   Renal/GU      Musculoskeletal   Abdominal   Peds  Hematology   Anesthesia Other Findings   Reproductive/Obstetrics                          Anesthesia Physical Anesthesia Plan  ASA: III  Anesthesia Plan: General   Post-op Pain Management:    Induction: Intravenous  Airway Management Planned: Oral ETT  Additional Equipment:   Intra-op Plan:   Post-operative Plan: Extubation in OR  Informed Consent: I have reviewed the patients History and Physical, chart, labs and discussed the procedure including the risks, benefits and alternatives for the proposed anesthesia with the patient or authorized representative who has indicated his/her understanding and acceptance.     Plan Discussed with:   Anesthesia Plan Comments:         Anesthesia Quick Evaluation

## 2011-02-16 NOTE — Brief Op Note (Signed)
02/16/2011  9:31 AM  PATIENT:  Minna Antis  77 y.o. female  PRE-OPERATIVE DIAGNOSIS:  CBD stent, dilated biliary system  POST-OPERATIVE DIAGNOSIS:  CBD stent, dilated biliary system  PROCEDURE:  Procedure(s): ENDOSCOPIC RETROGRADE CHOLANGIOPANCREATOGRAPHY (ERCP) BILIARY STENT PLACEMENT SPHINCTEROTOMY  SURGEON:  Surgeon(s): Malissa Hippo, MD  ERCP note. Peri-ampullary diverticulum Biliary stent removed  Sphincterotomy extended. Multiple stones removed. 10-11 mm but a balloon could not be controlled to the distal CBD indicative of stricture just above the ampulla Therefore 10 French 7 cm plastic stent placed for decompression

## 2011-02-16 NOTE — Op Note (Signed)
Misty [name], Misty [name]                ACCOUNT NO.:  [id number]  MEDICAL RECORD NO.:  [id number]  LOCATION:  [facility]                          FACILITY:  [facility]  PHYSICIAN:  Lionel December, M.D.    DATE OF BIRTH:  [birth year]  DATE OF PROCEDURE:  02/16/2011 DATE OF DISCHARGE:                              OPERATIVE REPORT   PROCEDURE:  ERCP with stent exchange extension of biliary sphincterotomy and removal of multiple stones.  INDICATION:  [name] is a 77 year old Caucasian female who presented with obstructive jaundice back in June.  She had ERCP suggesting papillary stenosis.  She had a sphincterotomy and placement of biliary stent.  She has done well.  She is not returning for removal of this stent and exchange as appropriate.  Procedure risks were reviewed with the patient and her son Annisha Baar.  Informed consent was obtained.  Medications for conscious sedation anesthesia, please see anesthesia records for details.  FINDINGS:  Procedure performed in the OR.  The patient was placed under anesthesia intubated and positioned in semiprone position.  Therapeutic Pentax video duodenoscope was passed through oropharynx without any difficulty into esophagus and across the stomach and pylorus into bulb and second part of the duodenum.  Stent was in place.  Periampullary diverticula was noted on the right side.  The stent was caught with snare and removed under fluoroscopic control.  Endoscope was introduced and CBD cannulated with Rx 44 Autotome and 035 guidewire.  Dilute contrast was injected.  Sigmoid shaped common bile duct and hepatic duct was markedly dilated.  Intrahepatic radicals were partially filled;distal most segment of CBD not filled with contrast. Sphincterotomy was extended.  Stone balloon extractor was trolled through the duct 3 times with removal of debris and multiple stones. However, I could not pass the balloon across the distal part of the bile duct.  I felt  sphincterotomy was advocated.  It was felt that she may have CBD stricture just proximal to the ampulla.  I, therefore, decided to re-stent her.  A 10-French 7-cm long plastic stent was placed using one-step Microvasive system.  Endoscope was withdrawn.  The patient tolerated the procedure well.  She is in the process of being extubated.  FINAL DIAGNOSES:  Previously, placed biliary stent removed.  Biliary sphincterotomy extended with removal of multiple stones. However, 10-11 mm balloon could not be passed through the distal CBD suggesting stricture just proximal to the ampulla.  A 10-French 7-cm plastic stent placed for biliary decompression.  Periampullary diverticulum.  PD was not filled with contrast or study.  RECOMMENDATIONS: 1. Clear liquids today. 2. No antiplatelet agents for 5 days. 3. We will consider a fold to [facility] to determine next step     in her management.          ______________________________ Lionel December, M.D.     NR/MEDQ  D:  02/16/2011  T:  02/16/2011  Job:  [id number]  cc:   Donzetta Sprung, MD Fax: [phone]

## 2011-02-16 NOTE — Transfer of Care (Signed)
Immediate Anesthesia Transfer of Care Note  Patient: Misty [name]  Procedure(s) Performed:  ENDOSCOPIC RETROGRADE CHOLANGIOPANCREATOGRAPHY (ERCP) - 7:30/with Stent Removal; BILIARY STENT PLACEMENT; SPHINCTEROTOMY  Patient Location: PACU  Anesthesia Type: General  Level of Consciousness: oriented and sedated  Airway & Oxygen Therapy: Patient Spontanous Breathing and Patient connected to face mask oxygen  Post-op Assessment: Report given to PACU RN  Post vital signs: Reviewed and stable  Complications: No apparent anesthesia complications

## 2011-02-16 NOTE — H&P (Signed)
Misty [name] is an 77 y.o. female.   Chief Complaint: Patient is here for ERCP and removal of papillary stent. HPI: Patient is 76 year old Caucasian female with history of pulmonary sarcoidosis who presented back in first week of June 2012 with jaundice and dilated biliary system. 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 She underwent biliary sphincterotomy followed by stenting. Cholestasis has resolved. Her son just states that she does not eat well. She complains of abdominal rumbling but denies abdominal pain nausea or vomiting. He also denies fever chills or chest pain. She's had 3 acute abdominal series and stent stent has not migrated and therefore will be removed. Addendum to past medical history Only sarcoidosis was diagnosed 20 years ago and lately she has remained in remission.  Past Medical History  Diagnosis Date  . Anorexia   . Weight loss   . Jaundice   . Biliary obstruction   . Hypothyroidism   . Diabetes mellitus   . GERD (gastroesophageal reflux disease)   . Sarcoidosis     Past Surgical History  Procedure Date  . Ercp 10/08/2010  . Vagina surgery as a teen    Family History  Problem Relation Age of Onset  . Anesthesia problems Neg Hx   . Hypotension Neg Hx   . Malignant hyperthermia Neg Hx   . Pseudochol deficiency Neg Hx    Social History:  reports that she has never smoked. She does not have any smokeless tobacco history on file. She reports that she does not drink alcohol or use illicit drugs.  Allergies:  Allergies  Allergen Reactions  . Sulfur     Medications Prior to Admission  Medication Dose Route Frequency Provider Last Rate Last Dose  . ampicillin (OMNIPEN) 2 g in sodium chloride 0.9 % 50 mL IVPB  2 g Intravenous Once Malissa Hippo, MD      . ciprofloxacin (CIPRO) IVPB 400 mg  400 mg Intravenous 120 min pre-op Malissa Hippo, MD      . fentaNYL (SUBLIMAZE) 0.05 MG/ML injection           . glycopyrrolate (ROBINUL)  0.2 MG/ML injection           . glycopyrrolate (ROBINUL) injection 0.2 mg  0.2 mg Intravenous Once Laurene Footman      . lactated ringers infusion   Intravenous Continuous Laurene Footman      . midazolam (VERSED) 2 MG/2ML injection 1-2 mg  1-2 mg Intravenous Q5 Min x 3 PRN Laurene Footman      . midazolam (VERSED) 2 MG/2ML injection           . ondansetron (ZOFRAN) 4 MG/2ML injection           . ondansetron (ZOFRAN) injection 4 mg  4 mg Intravenous Once Laurene Footman      . propofol (DIPRIVAN) 10 MG/ML infusion           . rocuronium (ZEMURON) 50 MG/5ML injection           . succinylcholine (ANECTINE) 20 MG/ML injection            Medications Prior to Admission  Medication Sig Dispense Refill  . glipiZIDE (GLUCOTROL) 5 MG tablet Take 5 mg by mouth 2 (two) times daily before a meal.        . levothyroxine (SYNTHROID, LEVOTHROID) 75 MCG tablet Take 75 mcg by mouth daily.        . traMADol (ULTRAM-ER) 100 MG 24 hr  tablet Take 100 mg by mouth daily as needed. pain      . acetaminophen (TYLENOL) 100 MG/ML solution Take 10 mg/kg by mouth every 4 (four) hours as needed.        . diclofenac sodium (VOLTAREN) 1 % GEL Apply topically.          Results for orders placed during the hospital encounter of 02/16/11 (from the past 48 hour(s))  GLUCOSE, CAPILLARY     Status: Abnormal   Collection Time   02/16/11  6:29 AM      Component Value Range Comment   Glucose-Capillary 141 (*) 70 - 99 (mg/dL)    Comment 1 Documented in Chart      No results found.  Review of Systems  Constitutional: Weight loss: 5 pound weight loss in 3-4 months.  Respiratory: Shortness of breath: denies shortness or breath with her sit in sedentary lifestyle.   Gastrointestinal: Negative for heartburn, nausea, vomiting, abdominal pain, diarrhea, constipation, blood in stool and melena.    Blood pressure 177/92, pulse 92, temperature 98 F (36.7 C), temperature source Oral, resp. rate 23, SpO2 93.00%. Physical Exam    Constitutional:       Pleasant thin Caucasian female who is in no acute distress  HENT:  Mouth/Throat: Oropharynx is clear and moist.  Eyes: Conjunctivae are normal. No scleral icterus.  Neck: No thyromegaly present.  Cardiovascular: Normal rate, regular rhythm and normal heart sounds.   No murmur heard. Respiratory: Effort normal and breath sounds normal.       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 She exhibits no distension and no mass. There is no tenderness.  Musculoskeletal: She exhibits no edema.  Lymphadenopathy:    She has no cervical adenopathy.  Neurological: She is alert.  Skin: Skin is warm and dry.     Assessment/Plan History of obstructive jaundice secondary to capillary stenosis treated with sphincterotomy and biliary stenting and resolution of her cholestasis. Patient will undergo ERCP with stent removal and the cholangiogram to make sure she has not developed stones in her bile duct. Seizure reviewed with the patient and [name] and they're both agreeable.  Breeze Angell U 02/16/2011, 7:28 AM

## 2011-02-21 ENCOUNTER — Encounter (HOSPITAL_COMMUNITY): Payer: Self-pay | Admitting: Internal Medicine

## 2011-03-14 ENCOUNTER — Encounter (INDEPENDENT_AMBULATORY_CARE_PROVIDER_SITE_OTHER): Payer: Self-pay | Admitting: *Deleted

## 2011-03-14 ENCOUNTER — Ambulatory Visit (INDEPENDENT_AMBULATORY_CARE_PROVIDER_SITE_OTHER): Payer: Medicare Other | Admitting: Internal Medicine

## 2011-03-14 ENCOUNTER — Encounter (INDEPENDENT_AMBULATORY_CARE_PROVIDER_SITE_OTHER): Payer: Self-pay | Admitting: Internal Medicine

## 2011-03-14 ENCOUNTER — Other Ambulatory Visit (INDEPENDENT_AMBULATORY_CARE_PROVIDER_SITE_OTHER): Payer: Self-pay | Admitting: *Deleted

## 2011-03-14 VITALS — BP 162/98 | HR 80 | Temp 97.9°F | Ht 60.0 in | Wt 88.0 lb

## 2011-03-14 DIAGNOSIS — K831 Obstruction of bile duct: Secondary | ICD-10-CM

## 2011-03-14 NOTE — Progress Notes (Signed)
Presenting complaint; followup for CBD stricture. Subjective; patient is 76 year old Caucasian female patient of Dr. Aurther Loft Daniel's who presented with jaundice back in June 2012 and underwent ERCP with sphincterotomy and stenting. She returned for repeat ERCP on 02/16/2011. Old stent was removed along with some debris and small stones distal segment of the duct appeared to be narrowed as the balloon could not be passed across it. Therefore another stent was left in place.  She feels fine. She has gained 1 pound since her last visit. She denies abdominal pain. She does complain of rumbling. She believes her appetite has improved. She denies nausea or vomiting. Current medications Glipizide 5 mg by mouth every morning Tylenol 325 mg by mouth every 4 when necessary Diclofenac sodium 1% gel use as directed Levothyroxine 75 mcg by mouth daily MOM 1 tablespoonful daily when necessary Urso 300 mg twice daily Him at all 100 mg daily when necessary Objective; BP 162/98  Pulse 80  Temp 97.9 F (36.6 C)  Ht 5' (1.524 m)  Wt 88 lb (39.917 kg)  BMI 17.19 kg/m2 Conjunctiva is pink sclerae nonicteric oropharyngeal mucosa is normal. No neck masses or thyromegaly noted Abdomen is flat soft and nontender without organomegaly or masses. 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 Furthermore she has markedly dilated sigmoid shaped bile duct. Suspect she has a benign stricture which may be related to her sarcoidosis. I went over prior ERCP films with patient's son [name] and daughter-in-law Olegario Messier. She will need another ERCP which Spyglass. Patient and her family members do not want her to be referred to a tertiary Center and they would prefer to next procedure at [facility]. Plan. ERCP with Spyglass exam next month. If she has to be restented would consider placing 2 or 3 stents and leave them in for redo 4 months. She will continue on Urso at current dose.

## 2011-03-14 NOTE — Patient Instructions (Signed)
ERCP to be scheduled. Please do not take glipizide the day of to procedure.

## 2011-03-25 ENCOUNTER — Other Ambulatory Visit (INDEPENDENT_AMBULATORY_CARE_PROVIDER_SITE_OTHER): Payer: Self-pay | Admitting: Internal Medicine

## 2011-04-02 DIAGNOSIS — I509 Heart failure, unspecified: Secondary | ICD-10-CM

## 2011-04-02 HISTORY — DX: Heart failure, unspecified: I50.9

## 2011-04-08 ENCOUNTER — Encounter (HOSPITAL_COMMUNITY): Payer: Self-pay

## 2011-04-13 ENCOUNTER — Encounter (HOSPITAL_COMMUNITY): Payer: Medicare Other

## 2011-04-18 ENCOUNTER — Encounter (HOSPITAL_COMMUNITY): Payer: Self-pay

## 2011-04-18 ENCOUNTER — Encounter (HOSPITAL_COMMUNITY)
Admission: RE | Admit: 2011-04-18 | Discharge: 2011-04-18 | Disposition: A | Discharge status: Home / Self Care | Payer: Medicare Other | Source: Ambulatory Visit | Attending: Internal Medicine | Admitting: Internal Medicine

## 2011-04-18 ENCOUNTER — Other Ambulatory Visit (INDEPENDENT_AMBULATORY_CARE_PROVIDER_SITE_OTHER): Payer: Self-pay | Admitting: Internal Medicine

## 2011-04-18 DIAGNOSIS — K831 Obstruction of bile duct: Secondary | ICD-10-CM

## 2011-04-18 LAB — CBC
HCT: 41 % (ref 36.0–46.0)
MCV: 93.8 fL (ref 78.0–100.0)
Platelets: 208 10*3/uL (ref 150–400)
RBC: 4.37 MIL/uL (ref 3.87–5.11)
WBC: 4.3 10*3/uL (ref 4.0–10.5)

## 2011-04-18 LAB — COMPREHENSIVE METABOLIC PANEL
AST: 20 U/L (ref 0–37)
Alkaline Phosphatase: 73 U/L (ref 39–117)
CO2: 35 mEq/L — ABNORMAL HIGH (ref 19–32)
Chloride: 95 mEq/L — ABNORMAL LOW (ref 96–112)
Creatinine, Ser: 0.69 mg/dL (ref 0.50–1.10)
GFR calc non Af Amer: 81 mL/min — ABNORMAL LOW (ref >90)
Potassium: 4.5 mEq/L (ref 3.5–5.1)
Total Bilirubin: 0.5 mg/dL (ref 0.3–1.2)

## 2011-04-18 NOTE — Patient Instructions (Signed)
20 ALFREIDA STEFFENHAGEN  04/18/2011   Your procedure is scheduled on:  04/20/11  Report to [facility] at [place] [facility].  Call this number if you have problems the morning of surgery: [phone]   Remember:   Do not eat food:After Midnight.  May have clear liquids:until Midnight .  Clear liquids include soda, tea, black coffee, apple or grape juice, broth.  Take these medicines the morning of surgery with A SIP OF WATER: synthroid, zofran, protonix   Do not wear jewelry, make-up or nail polish.  Do not wear lotions, powders, or perfumes. You may wear deodorant.  Do not shave 48 hours prior to surgery.  Do not bring valuables to the hospital.  Contacts, dentures or bridgework may not be worn into surgery.  Leave suitcase in the car. After surgery it may be brought to your room.  For patients admitted to the hospital, checkout time is 11:00 AM the day of discharge.   Patients discharged the day of surgery will not be allowed to drive home.  Name and phone number of your driver: family  Special Instructions:  PATIENT INSTRUCTIONS POST-ANESTHESIA  IMMEDIATELY FOLLOWING SURGERY:  Do not drive or operate machinery for the first twenty four hours after surgery.  Do not make any important decisions for twenty four hours after surgery or while taking narcotic pain medications or sedatives.  If you develop intractable nausea and vomiting or a severe headache please notify your doctor immediately.  FOLLOW-UP:  Please make an appointment with your surgeon as instructed. You do not need to follow up with anesthesia unless specifically instructed to do so.  WOUND CARE INSTRUCTIONS (if applicable):  Keep a dry clean dressing on the anesthesia/puncture wound site if there is drainage.  Once the wound has quit draining you may leave it open to air.  Generally you should leave the bandage intact for twenty four hours unless there is drainage.  If the epidural site drains for more than 36-48 hours please call the  anesthesia department.  QUESTIONS?:  Please feel free to call your physician or the hospital operator if you have any questions, and they will be happy to assist you.     [facility] Anesthesia Department [address] [place] [phone]       Please read over the following fact sheets that you were given: Pain Booklet, Anesthesia Post-op Instructions and Care and Recovery After Surgery

## 2011-04-20 ENCOUNTER — Encounter (HOSPITAL_COMMUNITY)
Admission: AD | Disposition: A | Discharge status: Home / Self Care | Payer: Self-pay | Source: Ambulatory Visit | Attending: Internal Medicine

## 2011-04-20 ENCOUNTER — Encounter (HOSPITAL_COMMUNITY): Payer: Self-pay | Admitting: Anesthesiology

## 2011-04-20 ENCOUNTER — Encounter (HOSPITAL_COMMUNITY): Payer: Self-pay | Admitting: *Deleted

## 2011-04-20 ENCOUNTER — Ambulatory Visit (HOSPITAL_COMMUNITY): Payer: Medicare Other

## 2011-04-20 ENCOUNTER — Ambulatory Visit (HOSPITAL_COMMUNITY)
Admission: AD | Admit: 2011-04-20 | Discharge: 2011-04-20 | Disposition: A | Discharge status: Home / Self Care | Payer: Medicare Other | Source: Ambulatory Visit | Attending: Internal Medicine | Admitting: Internal Medicine

## 2011-04-20 ENCOUNTER — Ambulatory Visit (HOSPITAL_COMMUNITY): Payer: Medicare Other | Admitting: Anesthesiology

## 2011-04-20 DIAGNOSIS — J4489 Other specified chronic obstructive pulmonary disease: Secondary | ICD-10-CM | POA: Diagnosis present

## 2011-04-20 DIAGNOSIS — Q447 Other congenital malformations of liver: Secondary | ICD-10-CM

## 2011-04-20 DIAGNOSIS — E039 Hypothyroidism, unspecified: Secondary | ICD-10-CM | POA: Diagnosis present

## 2011-04-20 DIAGNOSIS — K5900 Constipation, unspecified: Secondary | ICD-10-CM | POA: Diagnosis present

## 2011-04-20 DIAGNOSIS — Z01812 Encounter for preprocedural laboratory examination: Secondary | ICD-10-CM | POA: Insufficient documentation

## 2011-04-20 DIAGNOSIS — K838 Other specified diseases of biliary tract: Secondary | ICD-10-CM

## 2011-04-20 DIAGNOSIS — Q445 Other congenital malformations of bile ducts: Secondary | ICD-10-CM

## 2011-04-20 DIAGNOSIS — Q441 Other congenital malformations of gallbladder: Secondary | ICD-10-CM

## 2011-04-20 DIAGNOSIS — K831 Obstruction of bile duct: Secondary | ICD-10-CM

## 2011-04-20 DIAGNOSIS — K8050 Calculus of bile duct without cholangitis or cholecystitis without obstruction: Secondary | ICD-10-CM | POA: Insufficient documentation

## 2011-04-20 DIAGNOSIS — E119 Type 2 diabetes mellitus without complications: Secondary | ICD-10-CM | POA: Diagnosis present

## 2011-04-20 DIAGNOSIS — E871 Hypo-osmolality and hyponatremia: Secondary | ICD-10-CM | POA: Diagnosis present

## 2011-04-20 DIAGNOSIS — K812 Acute cholecystitis with chronic cholecystitis: Principal | ICD-10-CM | POA: Diagnosis present

## 2011-04-20 DIAGNOSIS — E876 Hypokalemia: Secondary | ICD-10-CM | POA: Diagnosis not present

## 2011-04-20 DIAGNOSIS — D62 Acute posthemorrhagic anemia: Secondary | ICD-10-CM | POA: Diagnosis not present

## 2011-04-20 DIAGNOSIS — J449 Chronic obstructive pulmonary disease, unspecified: Secondary | ICD-10-CM | POA: Diagnosis present

## 2011-04-20 HISTORY — PX: ERCP: SHX5425

## 2011-04-20 HISTORY — PX: SPYGLASS CHOLANGIOSCOPY: SHX5441

## 2011-04-20 HISTORY — PX: BILIARY STENT PLACEMENT: SHX5538

## 2011-04-20 LAB — GLUCOSE, CAPILLARY: Glucose-Capillary: 241 mg/dL — ABNORMAL HIGH (ref 70–99)

## 2011-04-20 SURGERY — ERCP, WITH INTERVENTION IF INDICATED
Anesthesia: General

## 2011-04-20 MED ORDER — ACETYLCYSTEINE 20 % IN SOLN
RESPIRATORY_TRACT | Status: DC | PRN
  Administered 2011-04-20: 4 mL via ORAL

## 2011-04-20 MED ORDER — GLYCOPYRROLATE 0.2 MG/ML IJ SOLN
0.2000 mg | Freq: Once | INTRAMUSCULAR | Status: AC | PRN
  Administered 2011-04-20: 0.2 mg via INTRAVENOUS

## 2011-04-20 MED ORDER — FENTANYL CITRATE 0.05 MG/ML IJ SOLN: 25.0000 ug | INTRAMUSCULAR | Status: DC | PRN

## 2011-04-20 MED ORDER — MIDAZOLAM HCL 2 MG/2ML IJ SOLN
1.0000 mg | INTRAMUSCULAR | Status: DC | PRN
  Administered 2011-04-20: 2 mg via INTRAVENOUS

## 2011-04-20 MED ORDER — GLYCOPYRROLATE 0.2 MG/ML IJ SOLN
INTRAMUSCULAR | Status: AC
  Filled 2011-04-20: qty 1

## 2011-04-20 MED ORDER — PROPOFOL 10 MG/ML IV EMUL
INTRAVENOUS | Status: AC
  Filled 2011-04-20: qty 20

## 2011-04-20 MED ORDER — MIDAZOLAM HCL 2 MG/2ML IJ SOLN
INTRAMUSCULAR | Status: AC
  Filled 2011-04-20: qty 2

## 2011-04-20 MED ORDER — CIPROFLOXACIN IN D5W 400 MG/200ML IV SOLN
INTRAVENOUS | Status: AC
  Filled 2011-04-20: qty 200

## 2011-04-20 MED ORDER — CIPROFLOXACIN IN D5W 400 MG/200ML IV SOLN
400.0000 mg | INTRAVENOUS | Status: AC
  Administered 2011-04-20: 400 mg via INTRAVENOUS

## 2011-04-20 MED ORDER — GLUCAGON HCL (RDNA) 1 MG IJ SOLR
INTRAMUSCULAR | Status: DC | PRN
  Administered 2011-04-20 (×4): .25 mg via INTRAVENOUS

## 2011-04-20 MED ORDER — PROPOFOL 10 MG/ML IV EMUL
INTRAVENOUS | Status: DC | PRN
  Administered 2011-04-20 (×2): 20 mg via INTRAVENOUS
  Administered 2011-04-20: 50 mg via INTRAVENOUS

## 2011-04-20 MED ORDER — ONDANSETRON HCL 4 MG/2ML IJ SOLN
INTRAMUSCULAR | Status: AC
  Filled 2011-04-20: qty 2

## 2011-04-20 MED ORDER — SODIUM CHLORIDE 0.9 % IV SOLN
INTRAVENOUS | Status: DC | PRN
  Administered 2011-04-20: 08:00:00

## 2011-04-20 MED ORDER — STERILE WATER FOR IRRIGATION IR SOLN
Status: DC | PRN
  Administered 2011-04-20: 08:00:00

## 2011-04-20 MED ORDER — ROCURONIUM BROMIDE 50 MG/5ML IV SOLN
INTRAVENOUS | Status: AC
  Filled 2011-04-20: qty 1

## 2011-04-20 MED ORDER — ACETYLCYSTEINE 20 % IN SOLN
RESPIRATORY_TRACT | Status: AC
  Filled 2011-04-20: qty 4

## 2011-04-20 MED ORDER — LACTATED RINGERS IV SOLN
INTRAVENOUS | Status: DC
  Administered 2011-04-20: 1000 mL via INTRAVENOUS

## 2011-04-20 MED ORDER — LIDOCAINE HCL (CARDIAC) 10 MG/ML IV SOLN
INTRAVENOUS | Status: DC | PRN
  Administered 2011-04-20: 10 mg via INTRAVENOUS

## 2011-04-20 MED ORDER — FENTANYL CITRATE 0.05 MG/ML IJ SOLN
INTRAMUSCULAR | Status: DC | PRN
  Administered 2011-04-20 (×3): 25 ug via INTRAVENOUS

## 2011-04-20 MED ORDER — ONDANSETRON HCL 4 MG/2ML IJ SOLN
4.0000 mg | Freq: Once | INTRAMUSCULAR | Status: AC
  Administered 2011-04-20: 4 mg via INTRAVENOUS

## 2011-04-20 MED ORDER — FENTANYL CITRATE 0.05 MG/ML IJ SOLN
INTRAMUSCULAR | Status: AC
  Filled 2011-04-20: qty 2

## 2011-04-20 MED ORDER — ONDANSETRON HCL 4 MG/2ML IJ SOLN
4.0000 mg | Freq: Once | INTRAMUSCULAR | Status: AC | PRN
  Administered 2011-04-20: 4 mg via INTRAVENOUS

## 2011-04-20 MED ORDER — LIDOCAINE HCL (PF) 1 % IJ SOLN
INTRAMUSCULAR | Status: AC
  Filled 2011-04-20: qty 5

## 2011-04-20 MED ORDER — ROCURONIUM BROMIDE 100 MG/10ML IV SOLN
INTRAVENOUS | Status: DC | PRN
  Administered 2011-04-20: 20 mg via INTRAVENOUS

## 2011-04-20 MED ORDER — ONDANSETRON HCL 4 MG/2ML IJ SOLN
INTRAMUSCULAR | Status: AC
  Administered 2011-04-20: 4 mg via INTRAVENOUS
  Filled 2011-04-20: qty 2

## 2011-04-20 MED ORDER — NEOSTIGMINE METHYLSULFATE 1 MG/ML IJ SOLN
INTRAMUSCULAR | Status: AC
  Filled 2011-04-20: qty 10

## 2011-04-20 SURGICAL SUPPLY — 29 items
BAG HAMPER (MISCELLANEOUS) ×2 IMPLANT
BALLN RETRIEVAL 12X15 (BALLOONS) ×2 IMPLANT
BASKET TRAPEZOID 3X6 (MISCELLANEOUS) ×2 IMPLANT
CATH ACCESS DELIVERY (CATHETERS) ×2 IMPLANT
DEVICE INFLATION ENCORE 26 (MISCELLANEOUS) IMPLANT
DEVICE LOCKING W-BIOPSY CAP (MISCELLANEOUS) ×2 IMPLANT
FORCEPS BIOP RJ4 240 HOT (CUTTING FORCEPS) IMPLANT
FORCEPS BIOP SPYBITE 1.2X286 (FORCEP) ×2 IMPLANT
GUIDEWIRE HYDRA JAGWIRE .35 (WIRE) ×2 IMPLANT
GUIDEWIRE JAG HINI 025X260CM (WIRE) ×2 IMPLANT
KIT ROOM TURNOVER APOR (KITS) ×2 IMPLANT
LUBRICANT JELLY 4.5OZ STERILE (MISCELLANEOUS) ×2 IMPLANT
NEEDLE HYPO 18GX1.5 BLUNT FILL (NEEDLE) ×2 IMPLANT
NS IRRIG 500ML POUR BTL (IV SOLUTION) ×2 IMPLANT
PAD ARMBOARD 7.5X6 YLW CONV (MISCELLANEOUS) ×2 IMPLANT
PATHFINDER 450CM 0.18 (STENTS) ×2 IMPLANT
POSITIONER HEAD 8X9X4 ADT (SOFTGOODS) ×2 IMPLANT
PROBE VISUALIZATION SPYGLASS (PROBE) ×2 IMPLANT
SNARE ROTATE MED OVAL 20MM (MISCELLANEOUS) ×2 IMPLANT
SPHINCTEROTOME AUTOTOME .25 (MISCELLANEOUS) IMPLANT
SPHINCTEROTOME HYDRATOME 44 (MISCELLANEOUS) ×2 IMPLANT
SPONGE GAUZE 4X4 12PLY (GAUZE/BANDAGES/DRESSINGS) ×2 IMPLANT
SYR 3ML LL SCALE MARK (SYRINGE) ×2 IMPLANT
SYR 50ML LL SCALE MARK (SYRINGE) ×2 IMPLANT
SYSTEM CONTINUOUS INJECTION (MISCELLANEOUS) ×2 IMPLANT
TUBE IRRIGATION (IRRIGATION / IRRIGATOR) ×2 IMPLANT
WALLSTENT METAL COVERED 10X60 (STENTS) IMPLANT
WALLSTENT METAL COVERED 10X80 (STENTS) IMPLANT
WATER STERILE IRR 1000ML POUR (IV SOLUTION) ×2 IMPLANT

## 2011-04-20 NOTE — Anesthesia Postprocedure Evaluation (Signed)
Anesthesia Post Note  Patient: Misty [name]  Procedure(s) Performed:  ENDOSCOPIC RETROGRADE CHOLANGIOPANCREATOGRAPHY (ERCP) - removal of stent, removal of multiple stones with basket; SPYGLASS CHOLANGIOSCOPY; BILIARY STENT PLACEMENT  Anesthesia type: General  Patient location: PACU  Post pain: Pain level controlled  Post assessment: Post-op Vital signs reviewed, Patient's Cardiovascular Status Stable, Respiratory Function Stable, Patent Airway, No signs of Nausea or vomiting and Pain level controlled  Last Vitals:  Filed Vitals:   04/20/11 0947  BP: 134/66  Pulse: 86  Temp: 36.4 C  Resp: 19    Post vital signs: Reviewed and stable  Level of consciousness: awake and alert   Complications: No apparent anesthesia complications

## 2011-04-20 NOTE — Transfer of Care (Signed)
Immediate Anesthesia Transfer of Care Note  Patient: Misty [name]  Procedure(s) Performed:  ENDOSCOPIC RETROGRADE CHOLANGIOPANCREATOGRAPHY (ERCP) - removal of stent, removal of multiple stones with basket; SPYGLASS CHOLANGIOSCOPY; BILIARY STENT PLACEMENT  Patient Location: PACU  Anesthesia Type: General  Level of Consciousness: awake  Airway & Oxygen Therapy: Patient Spontanous Breathing and non-rebreather face mask  Post-op Assessment: Report given to PACU RN, Post -op Vital signs reviewed and stable and Patient moving all extremities  Post vital signs: Reviewed and stable  Complications: No apparent anesthesia complications

## 2011-04-20 NOTE — Anesthesia Procedure Notes (Signed)
Procedure Name: Intubation Date/Time: 04/20/2011 7:58 AM Performed by: Minerva Areola Pre-anesthesia Checklist: Patient identified, Patient being monitored, Timeout performed, Emergency Drugs available and Suction available Patient Re-evaluated:Patient Re-evaluated prior to inductionOxygen Delivery Method: Circle System Utilized Preoxygenation: Pre-oxygenation with 100% oxygen Intubation Type: IV induction Ventilation: Mask ventilation without difficulty Laryngoscope Size: Miller and 2 Grade View: Grade I Tube type: Oral Tube size: 7.0 mm Number of attempts: 1 Airway Equipment and Method: stylet Placement Confirmation: ETT inserted through vocal cords under direct vision,  positive ETCO2 and breath sounds checked- equal and bilateral Secured at: 21 cm Tube secured with: Tape Dental Injury: Teeth and Oropharynx as per pre-operative assessment

## 2011-04-20 NOTE — Brief Op Note (Signed)
04/20/2011  9:35 AM  PATIENT:  Misty [name]  77 y.o. female  PRE-OPERATIVE DIAGNOSIS:  cbd stricture  POST-OPERATIVE DIAGNOSIS:  cbd stricture  PROCEDURE:  Procedure(s): ENDOSCOPIC RETROGRADE CHOLANGIOPANCREATOGRAPHY (ERCP) SPYGLASS CHOLANGIOSCOPY BILIARY STENT PLACEMENT  SURGEON:  Surgeon(s): Malissa Hippo, MD  ERCP findings. Periampullary diverticulum. Biliary stent removed. Tortuous biliary system with common hepatic duct measuring 3 cm. Common bile duct smaller caliber. Multiple filling defects seen. Sphincterotomy extended .still could not pass balloon across the ampulla . Spyglass examination performed . Multiple stones noted. Multiple stone fragments removed using Dormia basket. 10 French 7 cm long biliary stent placed. Patient tolerated procedure well.

## 2011-04-20 NOTE — H&P (Signed)
Misty [name] is an 77 y.o. female.   Chief Complaint: Patient is here for ERCP. HPI: Patient is 76 year old Caucasian female who presented back in July 2012 obstructive jaundice. She had markedly dilated the system. She ERCP with sphincterotomy and stenting. She return for repeat exam about 2 months ago and multiple small stones and debris were removed from her bile duct which is still markedly dilated and tortuous and it would not drain very well.. I was not able to pass a balloon across the distal segment of the duct. Therefore stent was left in place. She has done well since then. I offered her to be referred to [facility] but she declined. She is now returning for repeat ERCP with cholangioscopy and she may or may not need to be restented.  Past Medical History  Diagnosis Date  . Anorexia   . Weight loss   . Jaundice   . Biliary obstruction   . Hypothyroidism   . Diabetes mellitus   . GERD (gastroesophageal reflux disease)   . Sarcoidosis     Past Surgical History  Procedure Date  . Vagina surgery as a teen  . Ercp 02/16/2011    Procedure: ENDOSCOPIC RETROGRADE CHOLANGIOPANCREATOGRAPHY (ERCP);  Surgeon: Misty Hippo, MD;  Location: [facility];  Service: Endoscopy;  Laterality: N/A;  7:30/with Stent Removal  . Biliary stent placement 02/16/2011    Procedure: BILIARY STENT PLACEMENT;  Surgeon: Misty Hippo, MD;  Location: [facility];  Service: Endoscopy;  Laterality: N/A;  . Sphincterotomy 02/16/2011    Procedure: SPHINCTEROTOMY;  Surgeon: Misty Hippo, MD;  Location: [facility];  Service: Endoscopy;  Laterality: N/A;  . Ercp 10/08/2010  . Appendectomy     Family History  Problem Relation Age of Onset  . Anesthesia problems Neg Hx   . Hypotension Neg Hx   . Malignant hyperthermia Neg Hx   . Pseudochol deficiency Neg Hx    Social History:  reports that she has never smoked. She does not have any smokeless tobacco history on file. She reports that she does not drink alcohol or  use illicit drugs.  Allergies:  Allergies  Allergen Reactions  . Sulfa Antibiotics Hives and Rash    Medications Prior to Admission  Medication Dose Route Frequency Provider Last Rate Last Dose  . acetylcysteine (MUCOMYST) 20 % nebulizer solution           . ciprofloxacin (CIPRO) 400 MG/200ML IVPB           . ciprofloxacin (CIPRO) IVPB 400 mg  400 mg Intravenous 120 min pre-op Misty Hippo, MD       Medications Prior to Admission  Medication Sig Dispense Refill  . diclofenac sodium (VOLTAREN) 1 % GEL Apply 1 application topically daily as needed. For arthritis pain      . glipiZIDE (GLUCOTROL) 5 MG tablet Take 5 mg by mouth daily.       Marland Kitchen levothyroxine (SYNTHROID, LEVOTHROID) 75 MCG tablet Take 75 mcg by mouth daily.        . pantoprazole (PROTONIX) 40 MG tablet Take 1 tablet by mouth daily as needed. For acid reflux        Results for orders placed during the hospital encounter of 04/20/11 (from the past 48 hour(s))  GLUCOSE, CAPILLARY     Status: Abnormal   Collection Time   04/20/11  6:35 AM      Component Value Range Comment   Glucose-Capillary 129 (*) 70 - 99 (mg/dL)  No results found.  Review of Systems  Constitutional: Negative for fever and chills. Weight loss: she had lost few pounds this year but lately her weight has been stable.  Gastrointestinal: Negative for abdominal pain, diarrhea, constipation, blood in stool and melena.    Blood pressure 167/82, pulse 86, temperature 98.2 F (36.8 C), temperature source Oral, resp. rate 20, SpO2 98.00%. Physical Exam  Constitutional:       Very thin Caucasian female who is in no acute distress  HENT:  Mouth/Throat: Oropharynx is clear and moist.  Eyes: Conjunctivae are normal. No scleral icterus.  Neck: No tracheal deviation present. No thyromegaly present.  Respiratory: Effort normal and breath sounds normal. She has no wheezes. She has no rales.       pectum carinatum chest wall deformity  Musculoskeletal: She  exhibits no edema.  Lymphadenopathy:    She has no cervical adenopathy.  Neurological: She is alert.  Skin: Skin is warm and dry.     Assessment/Plan Distal CBD obstruction hospital ED to stricture. ERCP with cholangioscopy. She may need to be restented depending on endoscopic findings. Patient and her son and daughter-in-law  are in agreement proceed with the procedure  Misty [name] U 04/20/2011, 6:59 AM

## 2011-04-20 NOTE — Op Note (Signed)
Misty [name], Misty [name]                ACCOUNT NO.:  [id number]  MEDICAL RECORD NO.:  [id number]  LOCATION:  [facility]                          FACILITY:  [facility]  PHYSICIAN:  Lionel December, M.D.    DATE OF BIRTH:  04-01-1930  DATE OF PROCEDURE:  04/20/2011 DATE OF DISCHARGE:  04/20/2011                              OPERATIVE REPORT   PROCEDURE:  ERCP with extension of biliary sphincterotomy, removal of multiple fragments of stone and stent exchange.  INDICATION:  Misty [name] is a 77 year old Caucasian female who presented with obstructive jaundice back in June of this year.  On her initial ERCP, she had markedly dilated tortuous bile duct.  She had pigment stone removed at that time.  She returned for second procedure 2 months ago and multiple stone fragments were removed, but I could not pass the balloon across ampulla, therefore stent was left in place.  She has done well except occasional bloating and/or nausea.  Her LFTs were normal. She is undergoing therapeutic ERCP with spy glass examination of bile duct to make sure she does not have distal stricture or tumor.  History is pertinent for sarcoidosis.  Procedures were reviewed with the patient.  Informed consent was obtained.  I also talked with her son, Misty [name], who is here today. I have also talked with her daughter-in-law, Misty [name] who was not in.  MEDS FOR SEDATION/ANESTHESIA:  Please see anesthesia records for details.  FINDINGS:  Procedure performed in the OR.  The patient was placed under anesthesia.  She was intubated and turned into semiprone position. Therapeutic Pentax video duodenum scope was passed via oropharynx without any difficulty into esophagus, stomach, and across pylorus into bulb and descending duodenum.  Stent was in place.  Periampullary diverticulum noted.  The stent was caught with snare and retrieved under fluoroscopic control.  Endoscope was passed again.  CBD was cannulated with RX 44 Autotome  and 0.35 Hydra Jagwire.  Dilute contrast was injected.  Common hepatic duct was at least 3 cm in diameter.  CBD was not as dilated.  Multiple filling defects were noted consistent with stones.  Biliary sphincterotomy was extended.  Some of the smaller stone fragments came out spontaneously.  Spy glass examination was performed. Common hepatic duct was very dilated with poor visualization.  Distally, there were multiple stone fragments.  No stricture or mass was noted in the distal most segment of the bile duct.  Spy glass examination was removed.  Dormia basket was passed few times and multiple stone fragments were removed.  Balloon stone extractor was passed into common hepatic duct and trolled and insufflated position but I still could not pass it across the distal most part of the bile duct.  I therefore proceeded with placement of biliary stent.  Using one-step Microvasive system, 10-French, 7 cm plastic stent was placed as it was deployed, there was free flow of contrast, bile in the duodenum.  Endoscope was withdrawn and patient in the process of being extubated.  The patient tolerated the procedure well.  FINAL DIAGNOSIS:  Markedly dilated biliary system with multiple filling defects or stones.  Sphincterotomy extended.  Multiple stone fragments were removed.  Duct could not be cleared of all the stones, therefore another 10-French 7-cm biliary stent left in place.  RECOMMENDATIONS:  Clear liquids today.  We will plan to see patient back in the office in 2 months.          ______________________________ Lionel December, M.D.     NR/MEDQ  D:  04/20/2011  T:  04/20/2011  Job:  [id number]  cc:   Donzetta Sprung, MD Fax: [phone]

## 2011-04-20 NOTE — Anesthesia Preprocedure Evaluation (Signed)
Anesthesia Evaluation  Patient identified by MRN, date of birth, ID band Patient awake    Reviewed: Allergy & Precautions, H&P , NPO status , Patient's Chart, lab work & pertinent test results  History of Anesthesia Complications Negative for: history of anesthetic complications  Airway Mallampati: I      Dental  (+) Edentulous Upper and Edentulous Lower   Pulmonary COPD (hx sarcoidosis)   Pulmonary exam normal       Cardiovascular neg cardio ROS Regular Normal    Neuro/Psych    GI/Hepatic   Endo/Other  Diabetes mellitus-, Well Controlled, Type 2, Oral Hypoglycemic AgentsHypothyroidism   Renal/GU      Musculoskeletal   Abdominal   Peds  Hematology   Anesthesia Other Findings   Reproductive/Obstetrics                           Anesthesia Physical Anesthesia Plan  ASA: III  Anesthesia Plan: General   Post-op Pain Management:    Induction: Intravenous  Airway Management Planned: Oral ETT  Additional Equipment:   Intra-op Plan:   Post-operative Plan:   Informed Consent: I have reviewed the patients History and Physical, chart, labs and discussed the procedure including the risks, benefits and alternatives for the proposed anesthesia with the patient or authorized representative who has indicated his/her understanding and acceptance.     Plan Discussed with:   Anesthesia Plan Comments:         Anesthesia Quick Evaluation

## 2011-04-21 LAB — GLUCOSE, CAPILLARY: Glucose-Capillary: 242 mg/dL — ABNORMAL HIGH (ref 70–99)

## 2011-04-22 ENCOUNTER — Inpatient Hospital Stay (HOSPITAL_COMMUNITY)
Admission: AD | Admit: 2011-04-22 | Discharge: 2011-04-25 | DRG: 445 | Disposition: A | Discharge status: Home / Self Care | Payer: Medicare Other | Source: Ambulatory Visit | Attending: Internal Medicine | Admitting: Internal Medicine

## 2011-04-22 ENCOUNTER — Encounter (HOSPITAL_COMMUNITY): Payer: Self-pay | Admitting: *Deleted

## 2011-04-22 ENCOUNTER — Inpatient Hospital Stay (HOSPITAL_COMMUNITY): Payer: Medicare Other

## 2011-04-22 DIAGNOSIS — K8063 Calculus of gallbladder and bile duct with acute cholecystitis with obstruction: Principal | ICD-10-CM | POA: Diagnosis present

## 2011-04-22 DIAGNOSIS — Q441 Other congenital malformations of gallbladder: Secondary | ICD-10-CM

## 2011-04-22 DIAGNOSIS — K5900 Constipation, unspecified: Secondary | ICD-10-CM | POA: Diagnosis present

## 2011-04-22 DIAGNOSIS — K449 Diaphragmatic hernia without obstruction or gangrene: Secondary | ICD-10-CM | POA: Diagnosis present

## 2011-04-22 DIAGNOSIS — E119 Type 2 diabetes mellitus without complications: Secondary | ICD-10-CM | POA: Diagnosis present

## 2011-04-22 DIAGNOSIS — R509 Fever, unspecified: Secondary | ICD-10-CM

## 2011-04-22 DIAGNOSIS — E039 Hypothyroidism, unspecified: Secondary | ICD-10-CM | POA: Diagnosis present

## 2011-04-22 DIAGNOSIS — J984 Other disorders of lung: Secondary | ICD-10-CM | POA: Diagnosis present

## 2011-04-22 DIAGNOSIS — K838 Other specified diseases of biliary tract: Secondary | ICD-10-CM

## 2011-04-22 DIAGNOSIS — R82998 Other abnormal findings in urine: Secondary | ICD-10-CM | POA: Diagnosis present

## 2011-04-22 DIAGNOSIS — R5381 Other malaise: Secondary | ICD-10-CM | POA: Diagnosis present

## 2011-04-22 DIAGNOSIS — Z882 Allergy status to sulfonamides status: Secondary | ICD-10-CM

## 2011-04-22 DIAGNOSIS — Q447 Other congenital malformations of liver: Secondary | ICD-10-CM

## 2011-04-22 DIAGNOSIS — K829 Disease of gallbladder, unspecified: Secondary | ICD-10-CM

## 2011-04-22 DIAGNOSIS — M810 Age-related osteoporosis without current pathological fracture: Secondary | ICD-10-CM | POA: Diagnosis present

## 2011-04-22 DIAGNOSIS — D72829 Elevated white blood cell count, unspecified: Secondary | ICD-10-CM | POA: Diagnosis present

## 2011-04-22 DIAGNOSIS — Z79899 Other long term (current) drug therapy: Secondary | ICD-10-CM

## 2011-04-22 DIAGNOSIS — R1031 Right lower quadrant pain: Secondary | ICD-10-CM

## 2011-04-22 DIAGNOSIS — Q445 Other congenital malformations of bile ducts: Secondary | ICD-10-CM

## 2011-04-22 DIAGNOSIS — E871 Hypo-osmolality and hyponatremia: Secondary | ICD-10-CM | POA: Diagnosis present

## 2011-04-22 LAB — URINALYSIS, ROUTINE W REFLEX MICROSCOPIC
Glucose, UA: >1000 mg/dL — AB
Specific Gravity, Urine: 1.025 (ref 1.005–1.030)
Urobilinogen, UA: 0.2 mg/dL (ref 0.0–1.0)
pH: 6 (ref 5.0–8.0)

## 2011-04-22 LAB — URINE MICROSCOPIC-ADD ON

## 2011-04-22 LAB — CBC
MCH: 31.2 pg (ref 26.0–34.0)
MCV: 92 fL (ref 78.0–100.0)
Platelets: 223 10*3/uL (ref 150–400)
RDW: 12.9 % (ref 11.5–15.5)
WBC: 19 10*3/uL — ABNORMAL HIGH (ref 4.0–10.5)

## 2011-04-22 LAB — GLUCOSE, CAPILLARY
Glucose-Capillary: 244 mg/dL — ABNORMAL HIGH (ref 70–99)
Glucose-Capillary: 264 mg/dL — ABNORMAL HIGH (ref 70–99)

## 2011-04-22 LAB — COMPREHENSIVE METABOLIC PANEL
AST: 23 U/L (ref 0–37)
Albumin: 3.4 g/dL — ABNORMAL LOW (ref 3.5–5.2)
Alkaline Phosphatase: 76 U/L (ref 39–117)
BUN: 13 mg/dL (ref 6–23)
CO2: 32 mEq/L (ref 19–32)
Chloride: 92 mEq/L — ABNORMAL LOW (ref 96–112)
Potassium: 4.2 mEq/L (ref 3.5–5.1)
Total Bilirubin: 1.2 mg/dL (ref 0.3–1.2)

## 2011-04-22 MED ORDER — INSULIN ASPART 100 UNIT/ML ~~LOC~~ SOLN
3.0000 [IU] | Freq: Three times a day (TID) | SUBCUTANEOUS | Status: DC
  Administered 2011-04-22 – 2011-04-25 (×7): 3 [IU] via SUBCUTANEOUS

## 2011-04-22 MED ORDER — MORPHINE SULFATE 2 MG/ML IJ SOLN
2.0000 mg | INTRAMUSCULAR | Status: DC | PRN
  Administered 2011-04-22 – 2011-04-23 (×3): 2 mg via INTRAVENOUS
  Filled 2011-04-22 (×3): qty 1

## 2011-04-22 MED ORDER — MORPHINE SULFATE 2 MG/ML IJ SOLN
1.0000 mg | INTRAMUSCULAR | Status: DC | PRN
  Administered 2011-04-22: 1 mg via INTRAVENOUS
  Filled 2011-04-22: qty 1

## 2011-04-22 MED ORDER — BOOST / RESOURCE BREEZE PO LIQD
1.0000 | Freq: Two times a day (BID) | ORAL | Status: DC
  Administered 2011-04-22 – 2011-04-25 (×7): 1 via ORAL
  Filled 2011-04-22 (×11): qty 1

## 2011-04-22 MED ORDER — POTASSIUM CHLORIDE IN NACL 20-0.45 MEQ/L-% IV SOLN
INTRAVENOUS | Status: DC
  Administered 2011-04-22: 1000 mL via INTRAVENOUS
  Administered 2011-04-23: 05:00:00 via INTRAVENOUS
  Filled 2011-04-22 (×6): qty 1000

## 2011-04-22 MED ORDER — ONDANSETRON HCL 4 MG PO TABS: 4.0000 mg | ORAL_TABLET | Freq: Four times a day (QID) | ORAL | Status: DC | PRN

## 2011-04-22 MED ORDER — POLYETHYLENE GLYCOL 3350 17 G PO PACK
17.0000 g | PACK | Freq: Two times a day (BID) | ORAL | Status: DC
  Administered 2011-04-22 – 2011-04-23 (×3): 17 g via ORAL
  Filled 2011-04-22 (×4): qty 1

## 2011-04-22 MED ORDER — ONDANSETRON HCL 4 MG/2ML IJ SOLN
4.0000 mg | Freq: Four times a day (QID) | INTRAMUSCULAR | Status: DC | PRN
  Administered 2011-04-22 – 2011-04-23 (×2): 4 mg via INTRAVENOUS
  Filled 2011-04-22 (×2): qty 2

## 2011-04-22 MED ORDER — PANTOPRAZOLE SODIUM 40 MG IV SOLR
40.0000 mg | INTRAVENOUS | Status: DC
  Administered 2011-04-22 – 2011-04-25 (×4): 40 mg via INTRAVENOUS
  Filled 2011-04-22 (×5): qty 40

## 2011-04-22 MED ORDER — PRO-STAT SUGAR FREE PO LIQD
30.0000 mL | Freq: Two times a day (BID) | ORAL | Status: DC
  Administered 2011-04-22 – 2011-04-25 (×5): 30 mL via ORAL
  Filled 2011-04-22 (×6): qty 30

## 2011-04-22 MED ORDER — ACETAMINOPHEN 500 MG PO TABS: 500.0000 mg | ORAL_TABLET | Freq: Four times a day (QID) | ORAL | Status: DC | PRN

## 2011-04-22 MED ORDER — LEVOTHYROXINE SODIUM 75 MCG PO TABS
75.0000 ug | ORAL_TABLET | Freq: Every day | ORAL | Status: DC
  Administered 2011-04-23 – 2011-04-25 (×3): 75 ug via ORAL
  Filled 2011-04-22 (×3): qty 1

## 2011-04-22 MED ORDER — INSULIN ASPART 100 UNIT/ML ~~LOC~~ SOLN
0.0000 [IU] | Freq: Three times a day (TID) | SUBCUTANEOUS | Status: DC
  Administered 2011-04-22: 5 [IU] via SUBCUTANEOUS
  Administered 2011-04-23: 2 [IU] via SUBCUTANEOUS
  Administered 2011-04-23 (×2): 1 [IU] via SUBCUTANEOUS
  Administered 2011-04-24 (×2): 2 [IU] via SUBCUTANEOUS
  Administered 2011-04-24: 1 [IU] via SUBCUTANEOUS
  Filled 2011-04-22: qty 3

## 2011-04-22 MED ORDER — CALCIUM CARBONATE-VITAMIN D 500-200 MG-UNIT PO TABS
1.0000 | ORAL_TABLET | Freq: Two times a day (BID) | ORAL | Status: DC
  Administered 2011-04-22 – 2011-04-25 (×6): 1 via ORAL
  Filled 2011-04-22 (×6): qty 1

## 2011-04-22 NOTE — H&P (Signed)
NAMEJATARA, Misty [name]                ACCOUNT NO.:  [id number]  MEDICAL RECORD NO.:  [id number]  LOCATION:  A318                          FACILITY:  [facility]  PHYSICIAN:  Lionel December, M.D.    DATE OF BIRTH:  07-05-1929  DATE OF ADMISSION:  04/22/2011 DATE OF DISCHARGE:  LH                             HISTORY & PHYSICAL   REASON FOR ADMISSION:  Right lower quadrant abdominal pain.  Nausea and inability to eat for 2 days.  The patient is status post ERCP with stenting 2 days ago.  HISTORY OF PRESENT ILLNESS:  The patient is a 77 year old Caucasian female with history of biliary tract disease which is reviewed under past medical history.  He underwent outpatient ERCP 2 days ago with removal of multiple stones from her bile duct and stent exchange.  She did well and went home.  Ever since, she has been nauseated.  She had heaving the day of the procedure and also this morning.  She has had few sips of liquid.  However, she has not been able to eat any solids.  She states she had pain in the right mid abdomen night before last, but since yesterday she has been complaining of pain in her right lower quadrant and right groin.  Yesterday, she had low-grade temp of 99.9. She has been constipated.  She has not had any bowel movement for 3 days which she has been passing gas.  She feels her abdomen is distended. She denies chest pain or shortness of breath.  She also denies heartburn, melena, rectal bleeding, hematuria or dysuria.  HOME MEDICATIONS: 1. She has been on Tylenol 325 mg p.o. 6 h. p.r.n. 2. Voltaren gel 1% to her back as needed for pain. 3. Glipizide 5 mg p.o. q.a.m. 4. Levothyroxine 75 mcg p.o. daily. 5. MOM 30 mL p.o. daily p.r.n. 6. Zofran 4 mg p.o. daily p.r.n. 7. Urso 300 mg p.o. b.i.d. 8. Pantoprazole 40 mg p.o. daily p.r.n.  PAST MEDICAL HISTORY:  Hypothyroidism was diagnosed 40 years ago.  History of pulmonary sarcoidosis diagnosed 20 years ago and she remains in  remission.  She has been diabetic for about 5 years.  Osteoporosis.  She has history of pelvic fracture several years ago.  She had suspension for uterus when she was 77 year old.  She presented back in June of this year with obstructive jaundice.  She had dilated biliary system.  She had ERCP on October 08, 2010.  She had markedly dilated biliary system.  The distal duct was not well seen. Sphincterotomy was performed with removal of pigment stone.  Stent was left in place.  She returned for repeat procedure on February 16, 2011. Sphincterotomy was extended and multiple stones were removed.  She appeared to have stricture at distal CBD and; therefore, she was restented.  She has been maintained on Urso.  She returned for elective ERCP 2 days ago.  Once again, she was noted to have markedly dilated tortuous biliary system.  Her common hepatic duct was at least 30-mm with multiple filling defects.  Several stones were removed.  Spyglass examination was performed.  No definite tumor or stricture was noted. However, balloon could not  be passed across the lower segment of CBD; therefore, 10-French, 7-cm stent was left in place.  ALLERGIES:  To SULFA.  FAMILY HISTORY:  Father died of CVA in his early 49s, mother lived to be 74.  She has 1 sister and 1 brother living.  They are both diabetic. One sister died of breast carcinoma at age 78, was diagnosed possibly 20 years earlier.  Two other sisters lived to be in their 23s and 1 brother died in his 32s.  He was diabetic.  SOCIAL HISTORY:  She is married.  She has 3 sons and 1 of her sons [name] is here with her.  She has never smoked cigarettes or drank alcohol. She worked at [employer] for many years for 30 years, but she has been retired for few.  PHYSICAL EXAMINATION:  GENERAL:  Pleasant, well-developed thin Caucasian female, who is in no acute distress. VITAL SIGNS:  She weighs 83 pounds and she is 58 inches tall, pulse 86 per minute,  blood pressure 139/71, respirations 16 and temp is 97.9. EYES:  Conjunctivae are pink.  Sclerae are nonicteric. MOUTH:  Oropharyngeal mucosa is normal.  She has upper and lower dentures in place. NECK:  No neck masses or thyromegaly noted. CHEST:  Deformed with increased AP diameter on a barrel-shaped chest. CARDIAC:  With regular rhythm.  Normal S1 and S2.  No murmur or gallop noted. LUNGS:  Clear to auscultation. ABDOMEN:  Somewhat distended.  Bowel sounds are hyperactive.  On palpation, soft abdomen with mild tenderness and right lower quadrant, where percussion note is tympanitic.  No organomegaly or masses noted. RECTAL:  Pieces of formed stool in the wall, but she does not appear to be impacted. EXTREMITIES:  Thin, but no clubbing or edema noted.  LABORATORY DATA:  WBC 19.0, H and H is 13.2 and 38.9, platelet count is 223 kg.  Serum amylase is 9, sodium 131, potassium 4.2, chloride 92, CO2 is 32, glucose 263, BUN 13, creatinine 0.62.  Total bilirubin is 1.2, AP 76, AST 23, ALT 17, total protein 7.1 with albumin of 3.4, calcium is 9.6.  Acute abdominal series revealed changes of emphysema and spiculated pleural parenchymal density in left upper lobe stable since previous film of October 07, 2010.  Air-fluid 11 and posterior mediastinum consistent with large hiatal hernia.  There is gaseous distention of small and large bowel, but no pneumoperitoneum noted.  Urinalysis is pending.  ASSESSMENT:  [name] is a 76 year old Caucasian female, who had ERCP 2 days ago who presents with right lower quadrant abdominal pain, nausea, heaving, single episode of low-grade fever, as well as constipation.  Her serum amylase is normal and LFTs are also normal. Not mentioned above.  Her stent appears to be in bile duct, although it is located inferiorly and medially, but it is in the same position as it was before.  She may have developed ileus or her symptoms could be due to constipation  since she has, but she does have leukocytosis need to rule out urinary tract infection.  PLAN:  Clear liquids.  IV fluids with IV Protonix.  We will use low-dose morphine for pain control.  Continue her levothyroxine.  MiraLax p.o. to help move her bowels.  We will repeat her CBC in a.m.  If symptoms persist, may need abdominopelvic CT.  ______________________________ Lionel December, M.D.     NR/MEDQ  D:  04/22/2011  T:  04/22/2011  Job:  [id number]  cc:   Donzetta Sprung, MD Fax: [phone]

## 2011-04-22 NOTE — H&P (Signed)
Dictated; job [id number] [id number]

## 2011-04-22 NOTE — Progress Notes (Signed)
INITIAL ADULT NUTRITION ASSESSMENT Date: 04/22/2011   Time: 2:47 PM  Reason for Assessment: Unintentional wt loss  ASSESSMENT: Female 77 y.o.   Past Medical History  Diagnosis Date  . Anorexia   . Weight loss   . Jaundice   . Biliary obstruction   . Hypothyroidism   . Diabetes mellitus   . GERD (gastroesophageal reflux disease)   . Sarcoidosis     Scheduled Meds:   . insulin aspart  0-9 Units Subcutaneous TID WC  . insulin aspart  3 Units Subcutaneous TID WC  . pantoprazole (PROTONIX) IV  40 mg Intravenous Q24H   Continuous Infusions:   . 0.45 % NaCl with KCl 20 mEq / L 1,000 mL (04/22/11 1311)   PRN Meds:.morphine injection, ondansetron (ZOFRAN) IV, ondansetron  Ht: 4\' 10"  (147.3 cm)  Wt: 83 lb 12.4 oz (38 kg)  Ideal Wt: 40.9 kg (90#) % Ideal Wt: 92%  Usual Wt: 95# % Usual Wt: 88%  Body mass index is 17.51 kg/(m^2).  Food/Nutrition Related Hx: Pt and family member provided hx. She reports wt loss since this past May and early satiety. She denies chew or swallow difficulty and says that she "watches her sweets". Family member [name]) says that he believes she is "afraid to eat". Suspect moderate-severe malnutrition given her inadequate oral intake and involuntary wt loss. We discussed the importance of adequate nutrition intake and ways to maximize her calorie and protein intake.   CMP     Component Value Date/Time   NA 131* 04/22/2011 1135   K 4.2 04/22/2011 1135   CL 92* 04/22/2011 1135   CO2 32 04/22/2011 1135   GLUCOSE 263* 04/22/2011 1135   BUN 13 04/22/2011 1135   CREATININE 0.62 04/22/2011 1135   CALCIUM 9.6 04/22/2011 1135   PROT 7.1 04/22/2011 1135   ALBUMIN 3.4* 04/22/2011 1135   AST 23 04/22/2011 1135   ALT 17 04/22/2011 1135   ALKPHOS 76 04/22/2011 1135   BILITOT 1.2 04/22/2011 1135   GFRNONAA 84* 04/22/2011 1135   GFRAA >90 04/22/2011 1135   CBC    Component Value Date/Time   WBC 19.0* 04/22/2011 1135   RBC 4.23 04/22/2011 1135   HGB 13.2 04/22/2011 1135   HCT 38.9 04/22/2011 1135   PLT 223 04/22/2011 1135   MCV 92.0 04/22/2011 1135   MCH 31.2 04/22/2011 1135   MCHC 33.9 04/22/2011 1135   RDW 12.9 04/22/2011 1135   CBG (last 3)   Basename 04/22/11 1207 04/20/11 1003 04/20/11 0952  GLUCAP 244* 242* 241*     Diet Order: Clear Liquid   IVF:    0.45 % NaCl with KCl 20 mEq / L Last Rate: 1,000 mL (04/22/11 1311)    Estimated Nutritional Needs:   Kcal:1330-1444 Protein:65-75 grams Fluid:1.3 L/d  NUTRITION DIAGNOSIS: -Inadequate oral intake (NI-2.1).  Status: Ongoing  RELATED TO: Altered GI function  AS EVIDENCE BY: Pt reported early satiety, unplanned wt loss >10 # since May 2012  MONITORING: Diet advancement/tolerance, po meal/oral supplement intakes and labs  GOAL: Pt will meet at least 85% minimum est energy and protein requirements   EDUCATION NEEDS: -Education needs addressed  INTERVENTION: Add Resource Breeze BID between meals, add ProStat 30 ml BID    Dietitian [phone]  DOCUMENTATION CODES Per approved criteria  -Non-severe (moderate) malnutrition in the context of chronic illness -Underweight    Eligio Angert, Charlann Noss 04/22/2011, 2:47 PM

## 2011-04-23 ENCOUNTER — Inpatient Hospital Stay (HOSPITAL_COMMUNITY): Payer: Medicare Other

## 2011-04-23 LAB — BASIC METABOLIC PANEL
CO2: 29 mEq/L (ref 19–32)
Calcium: 8.9 mg/dL (ref 8.4–10.5)
Creatinine, Ser: 0.7 mg/dL (ref 0.50–1.10)
Glucose, Bld: 162 mg/dL — ABNORMAL HIGH (ref 70–99)

## 2011-04-23 LAB — DIFFERENTIAL
Eosinophils Relative: 0 % (ref 0–5)
Lymphocytes Relative: 4 % — ABNORMAL LOW (ref 12–46)
Lymphs Abs: 0.7 10*3/uL (ref 0.7–4.0)
Monocytes Absolute: 1.2 10*3/uL — ABNORMAL HIGH (ref 0.1–1.0)
Monocytes Relative: 7 % (ref 3–12)

## 2011-04-23 LAB — GLUCOSE, CAPILLARY

## 2011-04-23 LAB — CBC
HCT: 34.5 % — ABNORMAL LOW (ref 36.0–46.0)
MCV: 92.7 fL (ref 78.0–100.0)
RBC: 3.72 MIL/uL — ABNORMAL LOW (ref 3.87–5.11)
WBC: 17.5 10*3/uL — ABNORMAL HIGH (ref 4.0–10.5)

## 2011-04-23 MED ORDER — FLEET ENEMA 7-19 GM/118ML RE ENEM
1.0000 | ENEMA | Freq: Once | RECTAL | Status: AC
  Administered 2011-04-23: 1 via RECTAL

## 2011-04-23 MED ORDER — DEXTROSE 5 % IV SOLN
INTRAVENOUS | Status: AC
  Filled 2011-04-23: qty 10

## 2011-04-23 MED ORDER — POTASSIUM CHLORIDE IN NACL 20-0.45 MEQ/L-% IV SOLN
INTRAVENOUS | Status: DC
  Administered 2011-04-24: via INTRAVENOUS
  Filled 2011-04-23 (×3): qty 1000

## 2011-04-23 MED ORDER — GLIPIZIDE 5 MG PO TABS
5.0000 mg | ORAL_TABLET | Freq: Every day | ORAL | Status: DC
  Administered 2011-04-23 – 2011-04-25 (×3): 5 mg via ORAL
  Filled 2011-04-23 (×3): qty 1

## 2011-04-23 MED ORDER — DEXTROSE 5 % IV SOLN
1.0000 g | INTRAVENOUS | Status: DC
  Administered 2011-04-23 – 2011-04-24 (×2): 1 g via INTRAVENOUS
  Filled 2011-04-23 (×2): qty 10

## 2011-04-23 NOTE — Progress Notes (Signed)
Subjective; Patient continues to complain of pain in right lower quadrant close to anterior superior iliac spine. She passed large amount of gas per rectum and feels better. She still has not had any bowel movement. She has some nausea last night but none this morning and she wants to eat regular food. She denies dysuria. No history of kidney stones. Objective; BP 108/69  Pulse 101  Temp(Src) 98.6 F (37 C) (Oral)  Resp 20  Ht 4\' 10"  (1.473 m)  Wt 83 lb 12.4 oz (38 kg)  BMI 17.51 kg/m2  SpO2 92% She appears comfortable. Abdomen is mildly distended with normal bowel sounds. Percussion note is tympanitic. She has mild tenderness at right lower quadrant laterally. No organomegaly or masses noted. No LE edema noted. Lab data;  UA result noted. Specimen gravity 1025, glucose greater than 1000 mg/dL ketones 15 mg/dL negative for nitrites. 3-6 WBCs and 21-50 RBC; hyaline casts noted. WBC 17.5, hemoglobin 11.9, hematocrit 34.5; neutrophils 89%. Serum sodium 129; glucose 162; CO2 is 29; potassium 4.1 and chloride 93. Assessment. Abdominal pain possibly secondary to colonic and small bowel distention; as may be postprocedure or secondary to constipation. May need to rule out for pain from her back depending on her clinical course. Leukocytosis; slight improvement; doubt UTI. Mild hyponatremia. Plan Advance diet. Resume Glucotrol 5 mg every morning. May need abdominal pelvic CT or skeletal films if symptoms do not improve with bowel movement. CBC and be met in a.m.Marland Kitchen

## 2011-04-24 ENCOUNTER — Inpatient Hospital Stay (HOSPITAL_COMMUNITY): Payer: Medicare Other

## 2011-04-24 LAB — GLUCOSE, CAPILLARY

## 2011-04-24 LAB — BASIC METABOLIC PANEL
CO2: 28 mEq/L (ref 19–32)
Chloride: 89 mEq/L — ABNORMAL LOW (ref 96–112)
Potassium: 4.7 mEq/L (ref 3.5–5.1)

## 2011-04-24 LAB — CBC
HCT: 31.8 % — ABNORMAL LOW (ref 36.0–46.0)
Hemoglobin: 10.9 g/dL — ABNORMAL LOW (ref 12.0–15.0)
MCV: 92.4 fL (ref 78.0–100.0)
Platelets: 190 10*3/uL (ref 150–400)
RBC: 3.44 MIL/uL — ABNORMAL LOW (ref 3.87–5.11)
WBC: 13.7 10*3/uL — ABNORMAL HIGH (ref 4.0–10.5)

## 2011-04-24 MED ORDER — IOHEXOL 300 MG/ML  SOLN
100.0000 mL | Freq: Once | INTRAMUSCULAR | Status: AC | PRN
  Administered 2011-04-24: 100 mL via INTRAVENOUS

## 2011-04-24 MED ORDER — DEXTROSE 5 % IV SOLN
INTRAVENOUS | Status: AC
  Filled 2011-04-24: qty 10

## 2011-04-24 MED ORDER — SODIUM CHLORIDE 0.9 % IV SOLN
INTRAVENOUS | Status: DC
  Administered 2011-04-24: 13:00:00 via INTRAVENOUS

## 2011-04-24 MED ORDER — SODIUM CHLORIDE 0.9 % IJ SOLN
INTRAMUSCULAR | Status: AC
  Administered 2011-04-24: 10 mL
  Filled 2011-04-24: qty 3

## 2011-04-24 MED ORDER — MAGNESIUM HYDROXIDE 400 MG/5ML PO SUSP
30.0000 mL | Freq: Every day | ORAL | Status: DC
  Administered 2011-04-24 (×2): 30 mL via ORAL
  Filled 2011-04-24 (×2): qty 30

## 2011-04-24 MED ORDER — TRAMADOL HCL 50 MG PO TABS
50.0000 mg | ORAL_TABLET | Freq: Two times a day (BID) | ORAL | Status: DC
  Administered 2011-04-24 – 2011-04-25 (×3): 50 mg via ORAL
  Filled 2011-04-24 (×3): qty 1

## 2011-04-24 MED ORDER — DICLOFENAC SODIUM 1 % TD GEL
Freq: Four times a day (QID) | TRANSDERMAL | Status: DC
  Administered 2011-04-24 – 2011-04-25 (×4): via TOPICAL
  Filled 2011-04-24: qty 100

## 2011-04-24 NOTE — Progress Notes (Signed)
Patient's abdominal pain is even less than this morning. Misty [name] abdomen is less distended than this morning. I went over CT results with the patient and Misty [name] son Misty [name]. She has a very large gallbladder with air bubble in multiple stones. This is a new finding since previous CT of June 2012 which was done at [facility] in [place], [place]. Misty [name] gallbladder has never filled on any of the previous ERCPs. 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 She has empyema of the gallbladder and the need surgery. She is not acutely ill and Misty [name] white cell is down to 13.7. Sodium is 123; Misty [name] hyponatremia may be due to Misty [name] illness IV changed to normal saline. Dr. Dian Situ of surgical service consulted for possible cholecystectomy.

## 2011-04-24 NOTE — Progress Notes (Signed)
Notified Dr. Leticia Penna to ask him if pt needed to be NPO after midnight as Dr. Karilyn Cota had asked me to do prior to the end of my shift.  Dr. Leticia Penna stated that he was had the impression that the patient was stable at this time. I voiced to Dr. Leticia Penna that the patient has no c/o pain, tolerating diet AEB no nausea or vomiting.  He stated he would review the patients chart and most likely come in tomorrow. Discussed the conversation with the patient and her son and they verbalized understanding and voiced no complaints at this time.  Will continue to monitor patient and notify MD if the patient has any changes in her condition.

## 2011-04-24 NOTE — Progress Notes (Signed)
Subjective; Patient states she feels better today. She passed large more gas after receiving Fleet enema yesterday but did not have any stool. She denies dysuria or hematuria. She continues to complain of pain in right lower quadrant particularly when she moves. Her son states that she has been using Voltaren gel and when necessary tramadol at home. Her appetite has improved. She declined to have CT yesterday stating she was worn out. She denies nausea this morning. Objective; BP 114/70  Pulse 87  Temp(Src) 98.1 F (36.7 C) (Oral)  Resp 20  Ht 4\' 10"  (1.473 m)  Wt 83 lb 12.4 oz (38 kg)  BMI 17.51 kg/m2  SpO2 95% She appears comfortable in bed. Abdomen is less distended than yesterday. Bowel sounds are normal. She has mild tenderness in LLQ and RLQ without any guarding. No organomegaly or masses noted. No LE edema. Lab data; CBC and be met pending. Urine culture in progress. Last 4 glucose readings in chronologic order of 163, 144, 137, 125. Assessment; #1. Abdominal pain and distention has decreased. Suspect her pain may be musculoskeletal rather than of GI origin. She was begun on ceftriaxone yesterday empirically for leukocytosis and abnormal urinalysis. Plan; Proceed with abdominopelvic CT. Tramadol 50 mg by mouth every 12 hours. Resume Voltaren gel application to area with pain 4 times a day. She MiraLAX and give her anymore and that she takes at home. #2. Nausea has resolved.

## 2011-04-25 LAB — URINALYSIS, ROUTINE W REFLEX MICROSCOPIC
Nitrite: NEGATIVE
Urobilinogen, UA: 0.2 mg/dL (ref 0.0–1.0)

## 2011-04-25 LAB — URINE MICROSCOPIC-ADD ON

## 2011-04-25 LAB — GLUCOSE, CAPILLARY
Glucose-Capillary: 113 mg/dL — ABNORMAL HIGH (ref 70–99)
Glucose-Capillary: 125 mg/dL — ABNORMAL HIGH (ref 70–99)

## 2011-04-25 LAB — BASIC METABOLIC PANEL
CO2: 30 mEq/L (ref 19–32)
Calcium: 8.4 mg/dL (ref 8.4–10.5)
Chloride: 96 mEq/L (ref 96–112)
Sodium: 130 mEq/L — ABNORMAL LOW (ref 135–145)

## 2011-04-25 MED ORDER — AMOXICILLIN-POT CLAVULANATE 875-125 MG PO TABS: 1.0000 | ORAL_TABLET | Freq: Two times a day (BID) | ORAL | Status: DC

## 2011-04-25 MED ORDER — CALCIUM CARBONATE-VITAMIN D 500-200 MG-UNIT PO TABS: 1.0000 | ORAL_TABLET | Freq: Two times a day (BID) | ORAL | Status: DC

## 2011-04-25 MED ORDER — TRAMADOL HCL 50 MG PO TABS: 50.0000 mg | ORAL_TABLET | Freq: Two times a day (BID) | ORAL | Status: AC

## 2011-04-25 MED ORDER — TRAMADOL HCL 50 MG PO TABS: 50.0000 mg | ORAL_TABLET | Freq: Four times a day (QID) | ORAL | Status: DC | PRN

## 2011-04-25 MED ORDER — AMOXICILLIN-POT CLAVULANATE 875-125 MG PO TABS
1.0000 | ORAL_TABLET | Freq: Two times a day (BID) | ORAL | Status: DC
  Administered 2011-04-25: 1 via ORAL
  Filled 2011-04-25: qty 1

## 2011-04-25 NOTE — Progress Notes (Signed)
Subjective; Patient feels better. She denies nausea. She complains of mild pain in right lower quadrant particularly when she moves. He is passing flatness but still has not had a BM. Her appetite is better. She ate more than half of for breakfast. Her son is concerned about her weakness since she has been in bed for last few days. Objective; BP 87/51  Pulse 81  Temp(Src) 97.8 F (36.6 C) (Oral)  Resp 20  Ht 4\' 10"  (1.473 m)  Wt 98 lb 8.7 oz (44.7 kg)  BMI 20.60 kg/m2  SpO2 94% Patient appears comfortable in bed. Heart with a regular rhythm normal S1 and S2 no murmur or gallop noted. Lungs are clear to auscultation Bowel sounds are normal. Abdomen is not distended any more. It is soft with mild tenderness in right lower quadrant but none elsewhere. No mass or distended gallbladder palpable. No LE edema. Lab data; Serum sodium 1:30, potassium 4.3, chloride 96, CO2 30, BUN 24, creatinine 0.57. Urine culture still pending. Assessment; #1. Right lower quadrant pain secondary to empyema of gallbladder/acute cholecystitis. She has been afebrile for 24-hours. I wonder if her gallbladder is partially decompressed because of pain and tenderness has decreased. #2. Hyponatremia much improved with change in her IV fluids her primary reason may be an infection in her gallbladder. #3. Abnormal urinalysis. Culture not complete; Numberwill repeat UA. #4 . Deconditioning ; acute on chronic problem . Plan ; DC morphine. Urine analysis  Weight Dr. Illene Regulus recommendations.

## 2011-04-25 NOTE — Progress Notes (Signed)
Pt discharged with instructions.  Pt verbalized understanding.  Pt left the floor via w/c with staff and family.  Spoke with Dr. Leticia Penna about the patient planned surgery for Wednesday.  He states to explain to the patient/son that she should be NPO after midnight on 04/26/11 for 04/27/11 and that she she should report to the short stay at 0900 on 04/27/11.  Voiced these instructions to the patients son and he verbalized understanding.  According to Purcell Nails she has notified the OR team of the surgical case and I have placed the information to the OR scheduler Kim at [phone].

## 2011-04-26 NOTE — Discharge Summary (Signed)
Principal diagnosis; Empyema of gallbladder. Secondary diagnoses; Large hiatal hernia. 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 History of pulmonary sarcoidosis with chronic pulmonary scarring. Hyponatremia. Hypothyroidism. Osteoporosis. Diabetes mellitus. Chronic low back pain. Abnormal urinalysis. Consultation with Dr. Tilford Pillar of surgical service. Condition at the time of discharge; improved. Patient is to return for a cholecystectomy on 04/27/2011. Discharge medications Current Facility-Administered Medications  Medication Dose Route Frequency Provider Last Rate Last Dose  . DISCONTD: 0.9 %  sodium chloride infusion   Intravenous Continuous Malissa Hippo, MD 50 mL/hr at 04/24/11 1247    . DISCONTD: acetaminophen (TYLENOL) tablet 500 mg  500 mg Oral Q6H PRN Malissa Hippo, MD      . DISCONTD: amoxicillin-clavulanate (AUGMENTIN) 875-125 MG per tablet 1 tablet  1 tablet Oral Q12H Fabio Bering, MD   1 tablet at 04/25/11 1212  . DISCONTD: calcium-vitamin D (OSCAL WITH D) 500-200 MG-UNIT per tablet 1 tablet  1 tablet Oral BID Malissa Hippo, MD   1 tablet at 04/25/11 1151  . DISCONTD: diclofenac sodium (VOLTAREN) 1 % transdermal gel   Topical QID Malissa Hippo, MD      . DISCONTD: feeding supplement (PRO-STAT SUGAR FREE 64) liquid 30 mL  30 mL Oral BID Francene Boyers, RD   30 mL at 04/25/11 1150  . DISCONTD: feeding supplement (RESOURCE BREEZE) liquid 1 Container  1 Container Oral BID Francene Boyers, RD   1 Container at 04/25/11 1147  . DISCONTD: glipiZIDE (GLUCOTROL) tablet 5 mg  5 mg Oral QAC breakfast Malissa Hippo, MD   5 mg at 04/25/11 1149  . DISCONTD: insulin aspart (novoLOG) injection 0-9 Units  0-9 Units Subcutaneous TID WC Malissa Hippo, MD   2 Units at 04/24/11 1752  . DISCONTD: insulin aspart (novoLOG) injection 3 Units  3 Units Subcutaneous TID WC Malissa Hippo, MD   3 Units at 04/25/11 1148  . DISCONTD: levothyroxine (SYNTHROID,  LEVOTHROID) tablet 75 mcg  75 mcg Oral QAC breakfast Malissa Hippo, MD   75 mcg at 04/25/11 1150  . DISCONTD: magnesium hydroxide (MILK OF MAGNESIA) suspension 30 mL  30 mL Oral QHS Malissa Hippo, MD   30 mL at 04/24/11 2223  . DISCONTD: ondansetron (ZOFRAN) injection 4 mg  4 mg Intravenous Q6H PRN Malissa Hippo, MD   4 mg at 04/23/11 1735  . DISCONTD: ondansetron (ZOFRAN) tablet 4 mg  4 mg Oral Q6H PRN Malissa Hippo, MD      . DISCONTD: pantoprazole (PROTONIX) injection 40 mg  40 mg Intravenous Q24H Malissa Hippo, MD   40 mg at 04/25/11 1150  . DISCONTD: traMADol (ULTRAM) tablet 50 mg  50 mg Oral Q12H Malissa Hippo, MD   50 mg at 04/25/11 1150   Current Outpatient Prescriptions  Medication Sig Dispense Refill  . acetaminophen (TYLENOL) 325 MG tablet Take 325 mg by mouth every 6 (six) hours as needed. For pain        . diclofenac sodium (VOLTAREN) 1 % GEL Apply 1 application topically daily as needed. For arthritis pain      . glipiZIDE (GLUCOTROL) 5 MG tablet Take 5 mg by mouth daily.       Marland Kitchen levothyroxine (SYNTHROID, LEVOTHROID) 75 MCG tablet Take 75 mcg by mouth daily.        . magnesium hydroxide (MILK OF MAGNESIA) 400 MG/5ML suspension Take 30 mLs by mouth daily as needed. For constipation      .  ondansetron (ZOFRAN) 4 MG tablet Take 4 mg by mouth every 8 (eight) hours as needed. For nausea      . pantoprazole (PROTONIX) 40 MG tablet Take 1 tablet by mouth daily as needed. For acid reflux      . ursodiol (ACTIGALL) 300 MG capsule Take 300 mg by mouth 2 (two) times daily.        Marland Kitchen amoxicillin-clavulanate (AUGMENTIN) 875-125 MG per tablet Take 1 tablet by mouth every 12 (twelve) hours.  14 tablet  0  . calcium-vitamin D (OSCAL WITH D) 500-200 MG-UNIT per tablet Take 1 tablet by mouth 2 (two) times daily.  60 tablet  11  . traMADol (ULTRAM) 50 MG tablet Take 1 tablet (50 mg total) by mouth every 12 (twelve) hours. Maximum dose= 8 tablets per day  60 tablet  2   Hospital  course. Patient is 77 year old Caucasian female who has history of biliary obstruction with very dilated tortuous biliary system who underwent ERCP with removal of multiple stones in Spyglass examination and restenting 2 days prior to admission. Patient presented with nausea right lower quadrant abdominal pain as well as low-grade fever. She also had been constipated. Her examination revealed temp of 97.9. Her abdomen was distended with normal bowel sounds and very tympanitic percussion note. She was mildly tender in right lower quadrant. Acute abdominal series revealed air-fluid level in the posterior mediastinum consistent with large hiatal hernia and gaseous distention of small and large bowel. No pneumoperitoneum is noted. She had leukocytosis with WBC of 19,000. Her H&H and platelet count were normal. Serum amylase was 9. Electrolytes are pertinent for a serum sodium of 131 bilirubin of 1.2. Her AP was 76 AST 23 ALT 17. Based on her initial evaluation I felt she may have postprocedure ileus or urinary tract infection. Her urinalysis revealed microscopic hematuria but negative for nitrite. Positive for glycosuria and trace ketones but  CO2 was normal at 32. I felt her trace ketones were positive because of dehydration and not DKA. Patient was treated with IV fluids, sliding scale insulin, IV PPI and morphine for pain control. She was also given Fleet an enema and begun on MiraLAX . She was begun on IV ceftriaxone and underwent abdominopelvic CT on 04/24/2011 which revealed very large elongated dilated gallbladder with fundus in the right iliac fossa. Multiple stones are identified along with scant amount of air and minimal wall thickening. Extensive pneumobilia secondary to non-dilated system and the fact that she has biliary stent in place. This study also confirmed a large hiatal hernia. Dr. Leticia Penna of surgical service was consulted. He saw the patient on Christmas Eve and fell that she was stable And could  wait for surgery after the holidays. Patient's WBC was down to 13.7 and she noted resolution of her nausea return of her appetite and decrease in her right lower quadrant abdominal pain. She was tolerating 4 g sodium diet and was passing flatus. Since her initial urinalysis was abnormal and culture was still pending urinalysis was repeated prior to discharge. It was negative for ketones and this time only 3-6 RBCs and 0-2 WBCs/HPF. Patient's hospitalization was also pertinent for mild hyponatremia on admission and got worse as her sodium dropped to 123. Her IV fluid was changed from half-normal to normal saline and serum sodium came up to 130. Her felt her hyponatremia was most likely secondary to acute cholecystitis. Patient was switched to Augmentin and discharged on Christmas Eve to return today hospital on 04/27/2011 for cholecystectomy. 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 She will need another ERCP next year to remove the stent and remaining stones.

## 2011-04-27 ENCOUNTER — Inpatient Hospital Stay (HOSPITAL_COMMUNITY): Payer: Medicare Other | Admitting: Anesthesiology

## 2011-04-27 ENCOUNTER — Inpatient Hospital Stay (HOSPITAL_COMMUNITY): Payer: Medicare Other

## 2011-04-27 ENCOUNTER — Encounter (HOSPITAL_COMMUNITY): Payer: Self-pay | Admitting: *Deleted

## 2011-04-27 ENCOUNTER — Encounter (HOSPITAL_COMMUNITY): Payer: Self-pay | Admitting: Anesthesiology

## 2011-04-27 ENCOUNTER — Other Ambulatory Visit: Payer: Self-pay | Admitting: General Surgery

## 2011-04-27 ENCOUNTER — Encounter (HOSPITAL_COMMUNITY)
Admission: RE | Disposition: A | Discharge status: Home / Self Care | Payer: Self-pay | Source: Ambulatory Visit | Attending: General Surgery

## 2011-04-27 ENCOUNTER — Inpatient Hospital Stay (HOSPITAL_COMMUNITY)
Admission: RE | Admit: 2011-04-27 | Discharge: 2011-05-05 | DRG: 418 | Disposition: A | Discharge status: Home / Self Care | Payer: Medicare Other | Source: Ambulatory Visit | Attending: General Surgery | Admitting: General Surgery

## 2011-04-27 DIAGNOSIS — D62 Acute posthemorrhagic anemia: Secondary | ICD-10-CM | POA: Diagnosis not present

## 2011-04-27 DIAGNOSIS — J449 Chronic obstructive pulmonary disease, unspecified: Secondary | ICD-10-CM | POA: Diagnosis present

## 2011-04-27 DIAGNOSIS — I509 Heart failure, unspecified: Secondary | ICD-10-CM | POA: Diagnosis not present

## 2011-04-27 DIAGNOSIS — E039 Hypothyroidism, unspecified: Secondary | ICD-10-CM | POA: Diagnosis present

## 2011-04-27 DIAGNOSIS — E876 Hypokalemia: Secondary | ICD-10-CM | POA: Diagnosis not present

## 2011-04-27 DIAGNOSIS — K812 Acute cholecystitis with chronic cholecystitis: Secondary | ICD-10-CM | POA: Diagnosis present

## 2011-04-27 DIAGNOSIS — E119 Type 2 diabetes mellitus without complications: Secondary | ICD-10-CM | POA: Diagnosis present

## 2011-04-27 DIAGNOSIS — K5900 Constipation, unspecified: Secondary | ICD-10-CM | POA: Diagnosis present

## 2011-04-27 DIAGNOSIS — J984 Other disorders of lung: Secondary | ICD-10-CM | POA: Diagnosis not present

## 2011-04-27 DIAGNOSIS — E871 Hypo-osmolality and hyponatremia: Secondary | ICD-10-CM | POA: Diagnosis present

## 2011-04-27 DIAGNOSIS — K810 Acute cholecystitis: Secondary | ICD-10-CM | POA: Diagnosis not present

## 2011-04-27 DIAGNOSIS — J90 Pleural effusion, not elsewhere classified: Secondary | ICD-10-CM | POA: Diagnosis not present

## 2011-04-27 HISTORY — PX: CHOLECYSTECTOMY: SHX55

## 2011-04-27 LAB — URINE CULTURE
Colony Count: >=100000
Culture  Setup Time: 201212232124
Culture  Setup Time: 201212241700

## 2011-04-27 LAB — COMPREHENSIVE METABOLIC PANEL
ALT: 62 U/L — ABNORMAL HIGH (ref 0–35)
Albumin: 1.9 g/dL — ABNORMAL LOW (ref 3.5–5.2)
Alkaline Phosphatase: 59 U/L (ref 39–117)
BUN: 16 mg/dL (ref 6–23)
Chloride: 95 mEq/L — ABNORMAL LOW (ref 96–112)
Potassium: 4.8 mEq/L (ref 3.5–5.1)
Sodium: 130 mEq/L — ABNORMAL LOW (ref 135–145)
Total Bilirubin: 0.3 mg/dL (ref 0.3–1.2)
Total Protein: 4.5 g/dL — ABNORMAL LOW (ref 6.0–8.3)

## 2011-04-27 LAB — CBC
HCT: 29.1 % — ABNORMAL LOW (ref 36.0–46.0)
Hemoglobin: 9.5 g/dL — ABNORMAL LOW (ref 12.0–15.0)
MCHC: 32.6 g/dL (ref 30.0–36.0)
MCV: 96 fL (ref 78.0–100.0)
RDW: 13 % (ref 11.5–15.5)

## 2011-04-27 LAB — DIFFERENTIAL
Basophils Absolute: 0.1 10*3/uL (ref 0.0–0.1)
Basophils Relative: 1 % (ref 0–1)
Lymphocytes Relative: 10 % — ABNORMAL LOW (ref 12–46)
Lymphs Abs: 1.2 10*3/uL (ref 0.7–4.0)
Monocytes Relative: 5 % (ref 3–12)
Neutro Abs: 10.5 10*3/uL — ABNORMAL HIGH (ref 1.7–7.7)

## 2011-04-27 LAB — BLOOD GAS, ARTERIAL
Acid-Base Excess: 0.9 mmol/L (ref 0.0–2.0)
Drawn by: 22223
FIO2: 60 %
MECHVT: 350 mL
O2 Saturation: 99.3 %
Patient temperature: 37
RATE: 15 resp/min
TCO2: 23.1 mmol/L (ref 0–100)
pCO2 arterial: 36.5 mmHg (ref 35.0–45.0)

## 2011-04-27 LAB — GLUCOSE, CAPILLARY
Glucose-Capillary: 140 mg/dL — ABNORMAL HIGH (ref 70–99)
Glucose-Capillary: 162 mg/dL — ABNORMAL HIGH (ref 70–99)
Glucose-Capillary: 148 mg/dL — ABNORMAL HIGH (ref 70–99)

## 2011-04-27 LAB — HEMOGLOBIN AND HEMATOCRIT, BLOOD: HCT: 27.1 % — ABNORMAL LOW (ref 36.0–46.0)

## 2011-04-27 SURGERY — LAPAROSCOPIC CHOLECYSTECTOMY WITH INTRAOPERATIVE CHOLANGIOGRAM
Anesthesia: General | Site: Abdomen | Wound class: Dirty or Infected

## 2011-04-27 MED ORDER — LACTATED RINGERS IV SOLN
INTRAVENOUS | Status: DC
  Administered 2011-04-27: 10:00:00 via INTRAVENOUS

## 2011-04-27 MED ORDER — FENTANYL CITRATE 0.05 MG/ML IJ SOLN
INTRAMUSCULAR | Status: AC
  Filled 2011-04-27: qty 2

## 2011-04-27 MED ORDER — CEFAZOLIN SODIUM 1-5 GM-% IV SOLN
1.0000 g | INTRAVENOUS | Status: AC
  Administered 2011-04-27: 1 g via INTRAVENOUS

## 2011-04-27 MED ORDER — INSULIN ASPART 100 UNIT/ML ~~LOC~~ SOLN
0.0000 [IU] | SUBCUTANEOUS | Status: DC
  Administered 2011-04-27: 5 [IU] via SUBCUTANEOUS
  Administered 2011-04-27 – 2011-04-28 (×2): 3 [IU] via SUBCUTANEOUS
  Administered 2011-04-28: 2 [IU] via SUBCUTANEOUS
  Administered 2011-04-28: 1 [IU] via SUBCUTANEOUS
  Administered 2011-04-28: 2 [IU] via SUBCUTANEOUS
  Administered 2011-04-28 – 2011-04-29 (×2): 1 [IU] via SUBCUTANEOUS
  Administered 2011-04-29: 5 [IU] via SUBCUTANEOUS
  Administered 2011-04-29 (×2): 2 [IU] via SUBCUTANEOUS
  Administered 2011-04-30: 1 [IU] via SUBCUTANEOUS
  Administered 2011-04-30 (×2): 2 [IU] via SUBCUTANEOUS
  Administered 2011-04-30: 5 [IU] via SUBCUTANEOUS
  Administered 2011-04-30 – 2011-05-01 (×2): 1 [IU] via SUBCUTANEOUS
  Administered 2011-05-01: 3 [IU] via SUBCUTANEOUS
  Administered 2011-05-01 – 2011-05-02 (×3): 2 [IU] via SUBCUTANEOUS
  Administered 2011-05-02: 3 [IU] via SUBCUTANEOUS
  Administered 2011-05-02 – 2011-05-03 (×3): 2 [IU] via SUBCUTANEOUS
  Filled 2011-04-27 (×2): qty 3

## 2011-04-27 MED ORDER — FENTANYL CITRATE 0.05 MG/ML IJ SOLN: 25.0000 ug | INTRAMUSCULAR | Status: DC | PRN

## 2011-04-27 MED ORDER — ALBUTEROL SULFATE (5 MG/ML) 0.5% IN NEBU
2.5000 mg | INHALATION_SOLUTION | RESPIRATORY_TRACT | Status: DC | PRN
  Administered 2011-05-01 – 2011-05-03 (×4): 2.5 mg via RESPIRATORY_TRACT
  Filled 2011-04-27 (×4): qty 0.5

## 2011-04-27 MED ORDER — ROCURONIUM BROMIDE 50 MG/5ML IV SOLN
INTRAVENOUS | Status: AC
  Filled 2011-04-27: qty 1

## 2011-04-27 MED ORDER — ROCURONIUM BROMIDE 100 MG/10ML IV SOLN
INTRAVENOUS | Status: DC | PRN
  Administered 2011-04-27: 5 mg via INTRAVENOUS
  Administered 2011-04-27 (×2): 10 mg via INTRAVENOUS
  Administered 2011-04-27: 15 mg via INTRAVENOUS
  Administered 2011-04-27: 10 mg via INTRAVENOUS

## 2011-04-27 MED ORDER — PHENYLEPHRINE HCL 10 MG/ML IJ SOLN
INTRAMUSCULAR | Status: DC | PRN
  Administered 2011-04-27 (×4): 50 ug via INTRAVENOUS

## 2011-04-27 MED ORDER — METOPROLOL TARTRATE 1 MG/ML IV SOLN
INTRAVENOUS | Status: AC
  Filled 2011-04-27: qty 5

## 2011-04-27 MED ORDER — PROPOFOL 10 MG/ML IV EMUL
INTRAVENOUS | Status: AC
  Filled 2011-04-27: qty 20

## 2011-04-27 MED ORDER — PROPOFOL 10 MG/ML IV EMUL
INTRAVENOUS | Status: DC | PRN
  Administered 2011-04-27 (×2): 20 mg via INTRAVENOUS
  Administered 2011-04-27: 70 mg via INTRAVENOUS

## 2011-04-27 MED ORDER — SODIUM CHLORIDE 0.9 % IV SOLN
1.0000 g | INTRAVENOUS | Status: DC
  Administered 2011-04-27 – 2011-05-03 (×7): 1 g via INTRAVENOUS
  Filled 2011-04-27 (×8): qty 1

## 2011-04-27 MED ORDER — SODIUM CHLORIDE 0.9 % IR SOLN
Status: DC | PRN
  Administered 2011-04-27: 1000 mL

## 2011-04-27 MED ORDER — ONDANSETRON HCL 4 MG/2ML IJ SOLN
INTRAMUSCULAR | Status: AC
  Administered 2011-04-27: 4 mg via INTRAVENOUS
  Filled 2011-04-27: qty 2

## 2011-04-27 MED ORDER — MIDAZOLAM HCL 2 MG/2ML IJ SOLN
INTRAMUSCULAR | Status: AC
  Filled 2011-04-27: qty 2

## 2011-04-27 MED ORDER — CHLORHEXIDINE GLUCONATE 0.12 % MT SOLN
15.0000 mL | Freq: Two times a day (BID) | OROMUCOSAL | Status: DC
Start: 2011-04-27 — End: 2011-04-28
  Administered 2011-04-27 – 2011-04-28 (×2): 15 mL via OROMUCOSAL
  Filled 2011-04-27 (×2): qty 15

## 2011-04-27 MED ORDER — DEXAMETHASONE SODIUM PHOSPHATE 4 MG/ML IJ SOLN
INTRAMUSCULAR | Status: AC
  Administered 2011-04-27: 4 mg via INTRAVENOUS
  Filled 2011-04-27: qty 1

## 2011-04-27 MED ORDER — LORAZEPAM 2 MG/ML IJ SOLN
1.0000 mg | INTRAMUSCULAR | Status: DC | PRN
  Administered 2011-04-27: 2 mg via INTRAVENOUS
  Administered 2011-04-28: 1 mg via INTRAVENOUS
  Filled 2011-04-27 (×2): qty 1

## 2011-04-27 MED ORDER — GLYCOPYRROLATE 0.2 MG/ML IJ SOLN
0.2000 mg | Freq: Once | INTRAMUSCULAR | Status: AC
  Administered 2011-04-27: 0.2 mg via INTRAVENOUS

## 2011-04-27 MED ORDER — MORPHINE SULFATE 2 MG/ML IJ SOLN
1.0000 mg | INTRAMUSCULAR | Status: DC | PRN
  Administered 2011-04-27 – 2011-04-28 (×2): 2 mg via INTRAVENOUS
  Administered 2011-04-29: 1 mg via INTRAVENOUS
  Filled 2011-04-27 (×3): qty 1

## 2011-04-27 MED ORDER — NEOSTIGMINE METHYLSULFATE 1 MG/ML IJ SOLN
INTRAMUSCULAR | Status: DC | PRN
  Administered 2011-04-27 (×2): 1 mg via INTRAVENOUS

## 2011-04-27 MED ORDER — PHENYLEPHRINE HCL 10 MG/ML IJ SOLN
INTRAMUSCULAR | Status: AC
  Filled 2011-04-27: qty 1

## 2011-04-27 MED ORDER — LACTATED RINGERS IV BOLUS (SEPSIS)
1000.0000 mL | Freq: Once | INTRAVENOUS | Status: AC
  Administered 2011-04-27: 1000 mL via INTRAVENOUS

## 2011-04-27 MED ORDER — LACTATED RINGERS IV BOLUS (SEPSIS): 500.0000 mL | Freq: Once | INTRAVENOUS | Status: DC

## 2011-04-27 MED ORDER — MUPIROCIN CALCIUM 2 % EX CREA: TOPICAL_CREAM | Freq: Two times a day (BID) | CUTANEOUS | Status: DC

## 2011-04-27 MED ORDER — ONDANSETRON HCL 4 MG/2ML IJ SOLN: 4.0000 mg | Freq: Once | INTRAMUSCULAR | Status: AC | PRN

## 2011-04-27 MED ORDER — MUPIROCIN 2 % EX OINT
TOPICAL_OINTMENT | CUTANEOUS | Status: AC
  Administered 2011-04-27: 1
  Filled 2011-04-27: qty 22

## 2011-04-27 MED ORDER — GLYCOPYRROLATE 0.2 MG/ML IJ SOLN
INTRAMUSCULAR | Status: AC
  Filled 2011-04-27: qty 1

## 2011-04-27 MED ORDER — SODIUM CHLORIDE 0.9 % IR SOLN
Status: DC | PRN
  Administered 2011-04-27 (×2): 3000 mL

## 2011-04-27 MED ORDER — ENOXAPARIN SODIUM 40 MG/0.4ML ~~LOC~~ SOLN
SUBCUTANEOUS | Status: AC
  Administered 2011-04-27: 40 mg via SUBCUTANEOUS
  Filled 2011-04-27: qty 0.4

## 2011-04-27 MED ORDER — ENOXAPARIN SODIUM 40 MG/0.4ML ~~LOC~~ SOLN
40.0000 mg | Freq: Once | SUBCUTANEOUS | Status: AC
  Administered 2011-04-27: 40 mg via SUBCUTANEOUS

## 2011-04-27 MED ORDER — MIDAZOLAM HCL 2 MG/2ML IJ SOLN: 1.0000 mg | INTRAMUSCULAR | Status: DC | PRN

## 2011-04-27 MED ORDER — METOPROLOL TARTRATE 1 MG/ML IV SOLN
INTRAVENOUS | Status: DC | PRN
  Administered 2011-04-27: 1 mg via INTRAVENOUS

## 2011-04-27 MED ORDER — CEFAZOLIN SODIUM 1-5 GM-% IV SOLN
INTRAVENOUS | Status: AC
  Filled 2011-04-27: qty 50

## 2011-04-27 MED ORDER — LEVOTHYROXINE SODIUM 100 MCG IV SOLR
75.0000 ug | Freq: Every day | INTRAVENOUS | Status: DC
  Administered 2011-04-28 – 2011-04-30 (×3): 76 ug via INTRAVENOUS
  Filled 2011-04-27 (×5): qty 3.8

## 2011-04-27 MED ORDER — PANTOPRAZOLE SODIUM 40 MG IV SOLR
40.0000 mg | Freq: Every day | INTRAVENOUS | Status: DC
  Administered 2011-04-27 – 2011-04-30 (×4): 40 mg via INTRAVENOUS
  Filled 2011-04-27 (×4): qty 40

## 2011-04-27 MED ORDER — MIDAZOLAM HCL 5 MG/5ML IJ SOLN
INTRAMUSCULAR | Status: DC | PRN
  Administered 2011-04-27: 1 mg via INTRAVENOUS

## 2011-04-27 MED ORDER — NALOXONE HCL 0.4 MG/ML IJ SOLN
INTRAMUSCULAR | Status: DC | PRN
  Administered 2011-04-27: 100 ug via INTRAVENOUS
  Administered 2011-04-27: 50 ug via INTRAVENOUS

## 2011-04-27 MED ORDER — NALOXONE HCL 0.4 MG/ML IJ SOLN
INTRAMUSCULAR | Status: AC
  Filled 2011-04-27: qty 1

## 2011-04-27 MED ORDER — GLYCOPYRROLATE 0.2 MG/ML IJ SOLN
INTRAMUSCULAR | Status: DC | PRN
  Administered 2011-04-27 (×2): .1 mg via INTRAVENOUS

## 2011-04-27 MED ORDER — DEXAMETHASONE SODIUM PHOSPHATE 4 MG/ML IJ SOLN
4.0000 mg | Freq: Once | INTRAMUSCULAR | Status: AC
  Administered 2011-04-27: 4 mg via INTRAVENOUS

## 2011-04-27 MED ORDER — LACTATED RINGERS IV SOLN
INTRAVENOUS | Status: DC
  Administered 2011-04-27: 15:00:00 via INTRAVENOUS

## 2011-04-27 MED ORDER — HEMOSTATIC AGENTS (NO CHARGE) OPTIME
TOPICAL | Status: DC | PRN
  Administered 2011-04-27: 1 via TOPICAL

## 2011-04-27 MED ORDER — NEOSTIGMINE METHYLSULFATE 1 MG/ML IJ SOLN
INTRAMUSCULAR | Status: AC
  Filled 2011-04-27: qty 10

## 2011-04-27 MED ORDER — BIOTENE DRY MOUTH MT LIQD
15.0000 mL | Freq: Two times a day (BID) | OROMUCOSAL | Status: DC
  Administered 2011-04-27 – 2011-05-04 (×13): 15 mL via OROMUCOSAL

## 2011-04-27 MED ORDER — ONDANSETRON HCL 4 MG/2ML IJ SOLN: 4.0000 mg | Freq: Four times a day (QID) | INTRAMUSCULAR | Status: DC | PRN

## 2011-04-27 MED ORDER — LEVOTHYROXINE SODIUM 75 MCG PO TABS: 75.0000 ug | ORAL_TABLET | Freq: Every day | ORAL | Status: DC

## 2011-04-27 MED ORDER — LIDOCAINE HCL (PF) 1 % IJ SOLN
INTRAMUSCULAR | Status: AC
  Filled 2011-04-27: qty 5

## 2011-04-27 MED ORDER — ONDANSETRON HCL 4 MG/2ML IJ SOLN
4.0000 mg | Freq: Once | INTRAMUSCULAR | Status: AC
  Administered 2011-04-27: 4 mg via INTRAVENOUS

## 2011-04-27 MED ORDER — FENTANYL CITRATE 0.05 MG/ML IJ SOLN
INTRAMUSCULAR | Status: DC | PRN
  Administered 2011-04-27: 50 ug via INTRAVENOUS
  Administered 2011-04-27 (×4): 25 ug via INTRAVENOUS
  Administered 2011-04-27: 50 ug via INTRAVENOUS
  Administered 2011-04-27: 25 ug via INTRAVENOUS

## 2011-04-27 MED ORDER — BUPIVACAINE HCL (PF) 0.5 % IJ SOLN
INTRAMUSCULAR | Status: DC | PRN
  Administered 2011-04-27: 10 mL

## 2011-04-27 MED ORDER — GLYCOPYRROLATE 0.2 MG/ML IJ SOLN
INTRAMUSCULAR | Status: AC
  Administered 2011-04-27: 0.2 mg via INTRAVENOUS
  Filled 2011-04-27: qty 1

## 2011-04-27 SURGICAL SUPPLY — 49 items
APPLIER CLIP UNV 5X34 EPIX (ENDOMECHANICALS) ×3 IMPLANT
BAG HAMPER (MISCELLANEOUS) ×3 IMPLANT
BENZOIN TINCTURE PRP APPL 2/3 (GAUZE/BANDAGES/DRESSINGS) IMPLANT
CATH CHOLANGIOGRAM 4.5FR (CATHETERS) ×3 IMPLANT
CLOTH BEACON ORANGE TIMEOUT ST (SAFETY) ×3 IMPLANT
COVER LIGHT HANDLE STERIS (MISCELLANEOUS) ×6 IMPLANT
CUTTER ENDO LINEAR 45M (STAPLE) ×3 IMPLANT
CUTTER FLEX LINEAR 45M (STAPLE) ×3 IMPLANT
DECANTER SPIKE VIAL GLASS SM (MISCELLANEOUS) ×3 IMPLANT
DEVICE TROCAR PUNCTURE CLOSURE (ENDOMECHANICALS) ×3 IMPLANT
DRAPE C-ARMOR (DRAPES) ×3 IMPLANT
DURAPREP 26ML APPLICATOR (WOUND CARE) ×3 IMPLANT
ELECT REM PT RETURN 9FT ADLT (ELECTROSURGICAL) ×3
ELECTRODE REM PT RTRN 9FT ADLT (ELECTROSURGICAL) ×2 IMPLANT
EVACUATOR DRAINAGE 10X20 100CC (DRAIN) ×2 IMPLANT
EVACUATOR SILICONE 100CC (DRAIN) ×1
FILTER SMOKE EVAC LAPAROSHD (FILTER) ×3 IMPLANT
FORMALIN 10 PREFIL 120ML (MISCELLANEOUS) IMPLANT
GLOVE BIOGEL PI IND STRL 7.5 (GLOVE) ×2 IMPLANT
GLOVE BIOGEL PI INDICATOR 7.5 (GLOVE) ×1
GLOVE ECLIPSE 7.0 STRL STRAW (GLOVE) ×9 IMPLANT
GLOVE INDICATOR 7.0 STRL GRN (GLOVE) ×3 IMPLANT
GLOVE INDICATOR 7.5 STRL GRN (GLOVE) ×3 IMPLANT
GOWN STRL REIN XL XLG (GOWN DISPOSABLE) ×9 IMPLANT
HEMOSTAT SNOW SURGICEL 2X4 (HEMOSTASIS) ×3 IMPLANT
INST SET LAPROSCOPIC AP (KITS) ×3 IMPLANT
IV NS IRRIG 3000ML ARTHROMATIC (IV SOLUTION) ×6 IMPLANT
KIT ROOM TURNOVER APOR (KITS) ×3 IMPLANT
KIT TROCAR LAP CHOLE (TROCAR) ×3 IMPLANT
MANIFOLD NEPTUNE II (INSTRUMENTS) ×3 IMPLANT
PACK LAP CHOLE LZT030E (CUSTOM PROCEDURE TRAY) ×3 IMPLANT
PAD ARMBOARD 7.5X6 YLW CONV (MISCELLANEOUS) ×3 IMPLANT
POUCH SPECIMEN RETRIEVAL 10MM (ENDOMECHANICALS) ×3 IMPLANT
RELOAD 45 VASCULAR/THIN (ENDOMECHANICALS) ×9 IMPLANT
SET BASIN LINEN APH (SET/KITS/TRAYS/PACK) ×3 IMPLANT
SET TUBE IRRIG SUCTION NO TIP (IRRIGATION / IRRIGATOR) ×3 IMPLANT
SLEEVE Z-THREAD 5X100MM (TROCAR) ×3 IMPLANT
SPONGE DRAIN TRACH 4X4 STRL 2S (GAUZE/BANDAGES/DRESSINGS) ×3 IMPLANT
SPONGE GAUZE 2X2 8PLY STRL LF (GAUZE/BANDAGES/DRESSINGS) ×12 IMPLANT
STAPLER VISISTAT 35W (STAPLE) ×3 IMPLANT
STRIP CLOSURE SKIN 1/2X4 (GAUZE/BANDAGES/DRESSINGS) IMPLANT
SUT ETHILON 3 0 FSL (SUTURE) ×3 IMPLANT
SUT MNCRL AB 4-0 PS2 18 (SUTURE) ×6 IMPLANT
SUT VIC AB 2-0 CT2 27 (SUTURE) ×6 IMPLANT
SUT VIC AB 3-0 SH 27 (SUTURE) ×2
SUT VIC AB 3-0 SH 27X BRD (SUTURE) ×4 IMPLANT
TAPE CLOTH SURG 4X10 WHT LF (GAUZE/BANDAGES/DRESSINGS) ×3 IMPLANT
TROCAR BALLN 12MMX100 BLUNT (TROCAR) ×3 IMPLANT
WARMER LAPAROSCOPE (MISCELLANEOUS) ×3 IMPLANT

## 2011-04-27 NOTE — H&P (Signed)
Reason for Consult:Abdominal pain.  Cholecystitis  Referring Physician: Dr. Rehman   Misty [name] is an 77 y.o. female.  HPI: Patient had presented to [facility] with right-sided abdominal pain. Her workup has been extensive and included an ERCP by Dr. Rehman. This had demonstrated dilatation of the hepatic duct. No evidence for tumor or mass. 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 She does have a extremely dilated and enlarged gallbladder. Her symptomatology is actually improved and she was tolerating a regular diet. She denies any fevers chills currently. No nausea or vomiting. No jaundice.  Past Medical History  Diagnosis Date  . Anorexia   . Weight loss   . Jaundice   . Biliary obstruction   . Hypothyroidism   . Diabetes mellitus   . GERD (gastroesophageal reflux disease)   . Sarcoidosis     Past Surgical History  Procedure Date  . Vagina surgery as a teen  . Ercp 02/16/2011    Procedure: ENDOSCOPIC RETROGRADE CHOLANGIOPANCREATOGRAPHY (ERCP);  Surgeon: Najeeb U Rehman, MD;  Location: [facility];  Service: Endoscopy;  Laterality: N/A;  7:30/with Stent Removal  . Biliary stent placement 02/16/2011    Procedure: BILIARY STENT PLACEMENT;  Surgeon: Najeeb U Rehman, MD;  Location: [facility];  Service: Endoscopy;  Laterality: N/A;  . Sphincterotomy 02/16/2011    Procedure: SPHINCTEROTOMY;  Surgeon: Najeeb U Rehman, MD;  Location: [facility];  Service: Endoscopy;  Laterality: N/A;  . Ercp 10/08/2010  . Appendectomy     Family History  Problem Relation Age of Onset  . Anesthesia problems Neg Hx   . Hypotension Neg Hx   . Malignant hyperthermia Neg Hx   . Pseudochol deficiency Neg Hx     Social History:  reports that she has never smoked. She does not have any smokeless tobacco history on file. She reports that she does not drink alcohol or use illicit drugs.  Allergies:  Allergies  Allergen Reactions  . Sulfa  Antibiotics Hives and Rash    Medications:  I have reviewed the patient's current medications. Prior to Admission:  No prescriptions prior to admission   Scheduled:   Continuous:   PRN:    Results for orders placed during the hospital encounter of 04/22/11 (from the past 48 hour(s))  GLUCOSE, CAPILLARY     Status: Abnormal   Collection Time   04/25/11  7:57 AM      Component Value Range Comment   Glucose-Capillary 113 (*) 70 - 99 (mg/dL)   GLUCOSE, CAPILLARY     Status: Abnormal   Collection Time   04/25/11 11:22 AM      Component Value Range Comment   Glucose-Capillary 125 (*) 70 - 99 (mg/dL)   URINALYSIS, ROUTINE W REFLEX MICROSCOPIC     Status: Abnormal   Collection Time   04/25/11  2:17 PM      Component Value Range Comment   Color, Urine YELLOW  YELLOW     APPearance CLEAR  CLEAR     Specific Gravity, Urine 1.015  1.005 - 1.030     pH 6.0  5.0 - 8.0     Glucose, UA 100 (*) NEGATIVE (mg/dL)    Hgb urine dipstick LARGE (*) NEGATIVE     Bilirubin Urine NEGATIVE  NEGATIVE     Ketones, ur NEGATIVE  NEGATIVE (mg/dL)    Protein, ur TRACE (*) NEGATIVE (mg/dL)    Urobilinogen, UA 0.2  0.0 - 1.0 (mg/dL)      Nitrite NEGATIVE  NEGATIVE     Leukocytes, UA NEGATIVE  NEGATIVE    URINE MICROSCOPIC-ADD ON     Status: Abnormal   Collection Time   04/25/11  2:17 PM      Component Value Range Comment   Squamous Epithelial / LPF FEW (*) RARE     WBC, UA 0-2  <3 (WBC/hpf)    RBC / HPF 3-6  <3 (RBC/hpf)    Bacteria, UA FEW (*) RARE      No results found.  Review of Systems  Constitutional: Negative for fever and chills.  HENT: Negative.   Eyes: Negative.   Respiratory: Negative.   Cardiovascular: Negative.   Gastrointestinal: Positive for heartburn, nausea and abdominal pain (RLQ). Negative for vomiting, diarrhea, constipation, blood in stool and melena.  Genitourinary: Negative.   Musculoskeletal: Negative.   Skin: Negative.   Neurological: Negative.     Endo/Heme/Allergies: Negative.   Psychiatric/Behavioral: Negative.    There were no vitals taken for this visit. Physical Exam  Constitutional: She is oriented to person, place, and time. She appears well-developed and well-nourished. No distress.       elderly  HENT:  Head: Normocephalic and atraumatic.  Eyes: Conjunctivae and EOM are normal. Pupils are equal, round, and reactive to light. No scleral icterus.  Neck: Normal range of motion. Neck supple. No tracheal deviation present. No thyromegaly present.  Cardiovascular: Normal rate, regular rhythm and normal heart sounds.   Respiratory: Effort normal and breath sounds normal. No respiratory distress.  GI: Soft. Bowel sounds are normal. She exhibits no distension and no mass. There is tenderness (Mild right sided tenderness.  No peritoneal signs.). There is no rebound and no guarding.  Musculoskeletal: Normal range of motion.  Lymphadenopathy:    She has no cervical adenopathy.  Neurological: She is alert and oriented to person, place, and time.  Skin: Skin is warm and dry.    Assessment/Plan: Acute/chronic cholecystitis. Given the clinical findings I discussed with the patient and her son surgical intervention. Due to the timing and due to her stable course clinically I do feel is appropriate to discharge the patient with return for elective cholecystectomy and intraoperative cholangiogram. Although the patient has had a common bile duct stent placed, due to her change in anatomy specifically the dilated hepatic duct I would feel more confident with anatomical evaluation with intraoperative cholangiogram during the time of the procedure. This was discussed with the family as well as with Dr. Rehman. Risks benefits and alternatives were discussed with the patient. Indications to return back to the hospital for readmission were also discussed. We will plan to proceed in the next 48 hours with elective cholecystectomy and intraoperative  cholangiogram.  Myah Guynes C 04/27/2011, 7:52 AM      

## 2011-04-27 NOTE — Anesthesia Preprocedure Evaluation (Addendum)
Anesthesia Evaluation  Patient identified by MRN, date of birth, ID band Patient awake    Reviewed: Allergy & Precautions, H&P , NPO status , Patient's Chart, lab work & pertinent test results  History of Anesthesia Complications Negative for: history of anesthetic complications  Airway Mallampati: I      Dental  (+) Edentulous Upper and Edentulous Lower   Pulmonary COPD (hx sarcoidosis)   Pulmonary exam normal       Cardiovascular neg cardio ROS Regular Normal    Neuro/Psych    GI/Hepatic   Endo/Other  Diabetes mellitus-, Well Controlled, Type 2, Oral Hypoglycemic AgentsHypothyroidism   Renal/GU      Musculoskeletal   Abdominal   Peds  Hematology   Anesthesia Other Findings   Reproductive/Obstetrics                           Anesthesia Physical Anesthesia Plan  ASA: III  Anesthesia Plan: General   Post-op Pain Management:    Induction: Intravenous, Rapid sequence and Cricoid pressure planned  Airway Management Planned: Oral ETT  Additional Equipment:   Intra-op Plan:   Post-operative Plan: Extubation in OR  Informed Consent: I have reviewed the patients History and Physical, chart, labs and discussed the procedure including the risks, benefits and alternatives for the proposed anesthesia with the patient or authorized representative who has indicated his/her understanding and acceptance.     Plan Discussed with:   Anesthesia Plan Comments:         Anesthesia Quick Evaluation

## 2011-04-27 NOTE — Anesthesia Procedure Notes (Signed)
Procedure Name: Intubation Date/Time: 04/27/2011 11:03 AM Performed by: Minerva Areola Pre-anesthesia Checklist: Patient identified, Patient being monitored, Timeout performed, Emergency Drugs available and Suction available Patient Re-evaluated:Patient Re-evaluated prior to inductionOxygen Delivery Method: Circle System Utilized Preoxygenation: Pre-oxygenation with 100% oxygen Intubation Type: IV induction Ventilation: Mask ventilation without difficulty Laryngoscope Size: Miller and 2 Grade View: Grade I Tube type: Oral Tube size: 7.0 mm Number of attempts: 1 Airway Equipment and Method: stylet Placement Confirmation: ETT inserted through vocal cords under direct vision,  positive ETCO2 and breath sounds checked- equal and bilateral Secured at: 21 cm Tube secured with: Tape Dental Injury: Teeth and Oropharynx as per pre-operative assessment

## 2011-04-27 NOTE — Op Note (Signed)
Patient:  Misty [name]  DOB:  Mar 31, 1930  MRN:  [id number]   Preop Diagnosis:  Acute cholecystitis  Postop Diagnosis:  The same  Procedure:  Laparoscopic cholecystectomy, Intra-abdominal drain placement.  Surgeon:  Dr. Tilford Pillar  Anes:  General Endotracheal  Indications:  Patient is a 77 yo female who presents after recent evaluation for RLQ pain.  Patient was recently admitted by Dr. Karilyn Cota and underwent ERCP with stenting.  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 Risks benefits of l/s cholecystectomy possible open chole were discussed with the patient and the family. Their questions and concerns were addressed.  Patient was consented for the planned procedure.  Procedure note:  Patient was taken to the OR and placed in a supine position on the operating room table,  At this point, the general anesthesia was administered and once the patient was asleep, she was endotracheally intubated by the nurse anesthetist.  At this point, her abdomen was prepped and draped in the standard fashion,  At this point, a stab incision was created infra-umbilically with an 11-blade scalpel.  Additional dissection was carried out with a Coker clamp down to the fascia which was grasped and elevated.  At this point, a Veress needle was inserted.  Saline drop test was used to confirm intra-abdominal placement.  Pneumoperitoneal was initiated, once sufficient pneumoperitoneum was established, an 11mm trochar was inserted over a laparoscope allowing visualization of the trochar entering into to peritoneal cavity.  The inner canula was then removed and the laparoscope was re-inserted.  There was noted adhesions in the lower abdomen up to the point of the umbilicus.  I could visualize the epigastric region and therefore placed the 5mm trochar under direct visualization in the epigastric region.  A second 5mm trochar was inserted in the right subcostal margin.  The final 5mm trochar  was placed in the right lateral abdominal wall.  At this point, the 10mm scope was exchanged for a 5mm scope.  Sharp endo-shear dissection was used to lyse the lower adhesions.  There was no evidence of adherent bowel during the dissection.  At this time, the scope was changed back to a 10mm scope.    Attention was turned to exposing the gallbladder.  The gallbladder was noted to be very distended.  During initial manipulation, a small cholecystotomy was created with some purulent bile spillage.  This was aspirated with a suction-irrigator.  The peritoneal reflection was bluntly dissected off the infundibulum exposing the cystic duct.  The duct was noted to be dilated.  As the endo-clips would not reach across the duct, I placed a Dorsey clamp adjacent to the gallbladder across the duct.  A small defect was created in the duct to place the cholangiogram catheter.  Bilious secretion was noted.  The catheter was positioned and after confirming there was no back-flow of saline through the duct, and that the saline flushed easily, the c-arm was brought to the field.  A cholangiogram was performed.  I did feel that the dilated structure was the cystic duct.  I did have Dr.Boles do an intra-operative consultation to further confirm the radiographic studies.  After confirming the cystic duct, attention was turned to completing the cholecystectomy.  The proximal cystic duct was ligated and divided by using a 35mm endo-GIA stapler.  The cystic artery was identified and ligated with endo-clips.  The gallbladder was then dissected off the gallbladder fossa with electrocautery.  Once free, the gallbladder was placed in  an endo-catch bag.  Prior to this, the epigastric trochar was exchanged for an 11 mm trochar.  The gallbladder fossa was inspected.  It was raw with some bleeding noted.  Electrocautery was used to control the bleeding.  I copiously irrigated the field.  Surgicel snow was then placed into the fossa to assist  with hemostasis.  A 10-flat JP drain was placed into the gallbladder fossa and along the right pericolic gutter.  The tail of the drain was pulled through the lateral trochar site.  The pneumoperitoneum was evacuated.  The gallbladder was retrieved through the umbilical trochar site.  Additional enlargement of the trochar site was needed to remove the gallbladder.  It was removed in an intact endo-catch bag.  The gallbladder was placed on the back table and sent as a permament specimen.  The fascia at the umbilical trochar site was closed using a 3-0 vicryl suture.  The drain was secured to the skin with a 3-0 nylon suture.  The skin edges were closed using a skin stapler.  The skin was washed and dried with a moist and dry towel.  Sterile gauze dressing were placed.  The drapes removed.  The dressing secured.  Due to the patient's slow reversal and poor pulmonary reserves, it was decided to admit the patient to the ICU.  At the conclusion of the procedure, all instrument, needle, and sponge counts were correct.      Complications: none   EBL:  650 ml  Specimen:  gallbladder

## 2011-04-27 NOTE — Transfer of Care (Signed)
Immediate Anesthesia Transfer of Care Note  Patient: Misty [name]  Procedure(s) Performed:  LAPAROSCOPIC CHOLECYSTECTOMY WITH INTRAOPERATIVE CHOLANGIOGRAM  Patient Location: PACU and ICU  Anesthesia Type: General  Level of Consciousness: sedated  Airway & Oxygen Therapy: Patient remains intubated per anesthesia plan  Post-op Assessment: Report given to PACU RN  Post vital signs: Reviewed and stable  Complications: Patient re-intubated, pt remains intubated, never extubated

## 2011-04-27 NOTE — Interval H&P Note (Signed)
History and Physical Interval Note:  04/27/2011 10:47 AM  Misty [name]  has presented today for surgery, with the diagnosis of cholecystitis  The various methods of treatment have been discussed with the patient and family. After consideration of risks, benefits and other options for treatment, the patient has consented to  Procedure(s): LAPAROSCOPIC CHOLECYSTECTOMY as a surgical intervention .  The patients' history has been reviewed, patient examined, no change in status, stable for surgery.  I have reviewed the patients' chart and labs.  Questions were answered to the patient's satisfaction.     Misty [name]

## 2011-04-27 NOTE — Progress Notes (Signed)
MD notified of decreased BP. Labs and bolus ordered.

## 2011-04-27 NOTE — Consult Note (Signed)
Reason for Consult:Abdominal pain.  Cholecystitis  Referring Physician: Dr. Carollee Massed is an 77 y.o. female.  HPI: Patient had presented to [facility] with right-sided abdominal pain. Her workup has been extensive and included an ERCP by Dr. Karilyn Cota. This had demonstrated dilatation of the hepatic duct. No evidence for tumor or mass. 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 She does have a extremely dilated and enlarged gallbladder. Her symptomatology is actually improved and she was tolerating a regular diet. She denies any fevers chills currently. No nausea or vomiting. No jaundice.  Past Medical History  Diagnosis Date  . Anorexia   . Weight loss   . Jaundice   . Biliary obstruction   . Hypothyroidism   . Diabetes mellitus   . GERD (gastroesophageal reflux disease)   . Sarcoidosis     Past Surgical History  Procedure Date  . Vagina surgery as a teen  . Ercp 02/16/2011    Procedure: ENDOSCOPIC RETROGRADE CHOLANGIOPANCREATOGRAPHY (ERCP);  Surgeon: Malissa Hippo, MD;  Location: [facility];  Service: Endoscopy;  Laterality: N/A;  7:30/with Stent Removal  . Biliary stent placement 02/16/2011    Procedure: BILIARY STENT PLACEMENT;  Surgeon: Malissa Hippo, MD;  Location: [facility];  Service: Endoscopy;  Laterality: N/A;  . Sphincterotomy 02/16/2011    Procedure: SPHINCTEROTOMY;  Surgeon: Malissa Hippo, MD;  Location: [facility];  Service: Endoscopy;  Laterality: N/A;  . Ercp 10/08/2010  . Appendectomy     Family History  Problem Relation Age of Onset  . Anesthesia problems Neg Hx   . Hypotension Neg Hx   . Malignant hyperthermia Neg Hx   . Pseudochol deficiency Neg Hx     Social History:  reports that she has never smoked. She does not have any smokeless tobacco history on file. She reports that she does not drink alcohol or use illicit drugs.  Allergies:  Allergies  Allergen Reactions  . Sulfa  Antibiotics Hives and Rash    Medications:  I have reviewed the patient's current medications. Prior to Admission:  No prescriptions prior to admission   Scheduled:   Continuous:   PRN:    Results for orders placed during the hospital encounter of 04/22/11 (from the past 48 hour(s))  GLUCOSE, CAPILLARY     Status: Abnormal   Collection Time   04/25/11  7:57 AM      Component Value Range Comment   Glucose-Capillary 113 (*) 70 - 99 (mg/dL)   GLUCOSE, CAPILLARY     Status: Abnormal   Collection Time   04/25/11 11:22 AM      Component Value Range Comment   Glucose-Capillary 125 (*) 70 - 99 (mg/dL)   URINALYSIS, ROUTINE W REFLEX MICROSCOPIC     Status: Abnormal   Collection Time   04/25/11  2:17 PM      Component Value Range Comment   Color, Urine YELLOW  YELLOW     APPearance CLEAR  CLEAR     Specific Gravity, Urine 1.015  1.005 - 1.030     pH 6.0  5.0 - 8.0     Glucose, UA 100 (*) NEGATIVE (mg/dL)    Hgb urine dipstick LARGE (*) NEGATIVE     Bilirubin Urine NEGATIVE  NEGATIVE     Ketones, ur NEGATIVE  NEGATIVE (mg/dL)    Protein, ur TRACE (*) NEGATIVE (mg/dL)    Urobilinogen, UA 0.2  0.0 - 1.0 (mg/dL)  Nitrite NEGATIVE  NEGATIVE     Leukocytes, UA NEGATIVE  NEGATIVE    URINE MICROSCOPIC-ADD ON     Status: Abnormal   Collection Time   04/25/11  2:17 PM      Component Value Range Comment   Squamous Epithelial / LPF FEW (*) RARE     WBC, UA 0-2  <3 (WBC/hpf)    RBC / HPF 3-6  <3 (RBC/hpf)    Bacteria, UA FEW (*) RARE      No results found.  Review of Systems  Constitutional: Negative for fever and chills.  HENT: Negative.   Eyes: Negative.   Respiratory: Negative.   Cardiovascular: Negative.   Gastrointestinal: Positive for heartburn, nausea and abdominal pain (RLQ). Negative for vomiting, diarrhea, constipation, blood in stool and melena.  Genitourinary: Negative.   Musculoskeletal: Negative.   Skin: Negative.   Neurological: Negative.     Endo/Heme/Allergies: Negative.   Psychiatric/Behavioral: Negative.    There were no vitals taken for this visit. Physical Exam  Constitutional: She is oriented to person, place, and time. She appears well-developed and well-nourished. No distress.       elderly  HENT:  Head: Normocephalic and atraumatic.  Eyes: Conjunctivae and EOM are normal. Pupils are equal, round, and reactive to light. No scleral icterus.  Neck: Normal range of motion. Neck supple. No tracheal deviation present. No thyromegaly present.  Cardiovascular: Normal rate, regular rhythm and normal heart sounds.   Respiratory: Effort normal and breath sounds normal. No respiratory distress.  GI: Soft. Bowel sounds are normal. She exhibits no distension and no mass. There is tenderness (Mild right sided tenderness.  No peritoneal signs.). There is no rebound and no guarding.  Musculoskeletal: Normal range of motion.  Lymphadenopathy:    She has no cervical adenopathy.  Neurological: She is alert and oriented to person, place, and time.  Skin: Skin is warm and dry.    Assessment/Plan: Acute/chronic cholecystitis. Given the clinical findings I discussed with the patient and her son surgical intervention. Due to the timing and due to her stable course clinically I do feel is appropriate to discharge the patient with return for elective cholecystectomy and intraoperative cholangiogram. Although the patient has had a common bile duct stent placed, due to her change in anatomy specifically the dilated hepatic duct I would feel more confident with anatomical evaluation with intraoperative cholangiogram during the time of the procedure. This was discussed with the family as well as with Dr. Karilyn Cota. Risks benefits and alternatives were discussed with the patient. Indications to return back to the hospital for readmission were also discussed. We will plan to proceed in the next 48 hours with elective cholecystectomy and intraoperative  cholangiogram.  Misty [name] C 04/27/2011, 7:52 AM

## 2011-04-27 NOTE — Anesthesia Postprocedure Evaluation (Addendum)
  Anesthesia Post-op Note  Patient: Misty [name]  Procedure(s) Performed:  LAPAROSCOPIC CHOLECYSTECTOMY WITH INTRAOPERATIVE CHOLANGIOGRAM  Patient Location: PACU and ICU  Anesthesia Type: General  Level of Consciousness: sedated  Airway and Oxygen Therapy: Patient remains intubated per anesthesia plan  Post-op Pain: none  Post-op Assessment: Post-op Vital signs reviewed, Patient's Cardiovascular Status Stable and Respiratory Function Stable, on ventilator  Post-op Vital Signs: stable  Complications: respiratory complications, requires ventilation at present.  VSS Patient awake and oriented.  Extubated this am.  OOB in chair, O2 sats 100, [place], VSS.  Receiving blood today.  No apparent anesthesia complications.

## 2011-04-28 ENCOUNTER — Other Ambulatory Visit (HOSPITAL_COMMUNITY): Payer: Medicare Other

## 2011-04-28 ENCOUNTER — Other Ambulatory Visit: Payer: Self-pay

## 2011-04-28 ENCOUNTER — Inpatient Hospital Stay (HOSPITAL_COMMUNITY): Payer: Medicare Other

## 2011-04-28 LAB — GLUCOSE, CAPILLARY: Glucose-Capillary: 140 mg/dL — ABNORMAL HIGH (ref 70–99)

## 2011-04-28 LAB — COMPREHENSIVE METABOLIC PANEL
ALT: 89 U/L — ABNORMAL HIGH (ref 0–35)
Alkaline Phosphatase: 46 U/L (ref 39–117)
BUN: 18 mg/dL (ref 6–23)
CO2: 29 mEq/L (ref 19–32)
Chloride: 95 mEq/L — ABNORMAL LOW (ref 96–112)
GFR calc Af Amer: >90 mL/min (ref >90)
GFR calc non Af Amer: 84 mL/min — ABNORMAL LOW (ref >90)
Glucose, Bld: 195 mg/dL — ABNORMAL HIGH (ref 70–99)
Potassium: 4.8 mEq/L (ref 3.5–5.1)
Sodium: 129 mEq/L — ABNORMAL LOW (ref 135–145)
Total Bilirubin: 0.3 mg/dL (ref 0.3–1.2)
Total Protein: 4.5 g/dL — ABNORMAL LOW (ref 6.0–8.3)

## 2011-04-28 LAB — BLOOD GAS, ARTERIAL
Acid-Base Excess: 4.1 mmol/L — ABNORMAL HIGH (ref 0.0–2.0)
FIO2: 40 %
O2 Saturation: 98.7 %
PEEP: 5 cmH2O
Patient temperature: 37
Pressure support: 5 cmH2O
pO2, Arterial: 121 mmHg — ABNORMAL HIGH (ref 80.0–100.0)

## 2011-04-28 LAB — CBC
HCT: 22.8 % — ABNORMAL LOW (ref 36.0–46.0)
HCT: 33.9 % — ABNORMAL LOW (ref 36.0–46.0)
Hemoglobin: 11.9 g/dL — ABNORMAL LOW (ref 12.0–15.0)
MCHC: 34.2 g/dL (ref 30.0–36.0)
Platelets: 312 10*3/uL (ref 150–400)
RBC: 3.73 MIL/uL — ABNORMAL LOW (ref 3.87–5.11)
RDW: 13 % (ref 11.5–15.5)
RDW: 13.3 % (ref 11.5–15.5)
WBC: 10.7 10*3/uL — ABNORMAL HIGH (ref 4.0–10.5)
WBC: 13 10*3/uL — ABNORMAL HIGH (ref 4.0–10.5)

## 2011-04-28 LAB — ABO/RH: ABO/RH(D): O POS

## 2011-04-28 MED ORDER — LACTATED RINGERS IV BOLUS (SEPSIS)
1000.0000 mL | Freq: Once | INTRAVENOUS | Status: AC
  Administered 2011-04-28: 1000 mL via INTRAVENOUS

## 2011-04-28 MED ORDER — LACTATED RINGERS IV SOLN
INTRAVENOUS | Status: DC
  Administered 2011-04-28: 1000 mL via INTRAVENOUS

## 2011-04-28 MED ORDER — SODIUM CHLORIDE 0.9 % IJ SOLN
INTRAMUSCULAR | Status: AC
  Filled 2011-04-28: qty 3

## 2011-04-28 MED ORDER — SODIUM CHLORIDE 0.9 % IV SOLN
INTRAVENOUS | Status: DC
  Administered 2011-04-28: 100 mL via INTRAVENOUS
  Administered 2011-04-29: 1000 mL via INTRAVENOUS
  Administered 2011-04-30: 04:00:00 via INTRAVENOUS

## 2011-04-28 MED ORDER — FUROSEMIDE 10 MG/ML IJ SOLN
20.0000 mg | Freq: Once | INTRAMUSCULAR | Status: AC
  Administered 2011-04-28: 20 mg via INTRAVENOUS
  Filled 2011-04-28: qty 2

## 2011-04-28 NOTE — Addendum Note (Signed)
Addendum  created 04/28/11 1356 by Glynn Octave   Modules edited:Notes Section

## 2011-04-28 NOTE — Progress Notes (Signed)
1 Day Post-Op  Subjective: Pain minimal but in RUQ.  Controlled with medications.  No nausea.  No fever or chills.  NO SOB.  No CP.   Objective: Vital signs in last 24 hours: Temp:  [98.2 F (36.8 C)-99.4 F (37.4 C)] 98.6 F (37 C) (12/27 2000) Pulse Rate:  [78-110] 89  (12/27 1900) Resp:  [9-23] 19  (12/27 1900) BP: (82-139)/(37-84) 139/69 mmHg (12/27 1900) SpO2:  [98 %-100 %] 100 % (12/27 1912) FiO2 (%):  [34.9 %-40.2 %] 39.9 % (12/27 0900) Last BM Date: 04/26/11  Intake/Output from previous day: 12/26 0701 - 12/27 0700 In: 3926.7 [I.V.:2426.7; IV Piggyback:1500] Out: 1625 [Urine:650; Drains:375; Blood:600] Intake/Output this shift:    General appearance: alert and no distress Resp: fine crackles with inspiration. Cardio: tachy, reg rhtythm.  Occ early beat. GI: soft, flat, expected tenderness.  No peritoneal signs.  JP serosang with some brown tinge.  Lab Results:   [facility] 04/28/11 1834 04/28/11 0444  WBC 13.0* 10.7*  HGB 11.9* 7.8*  HCT 33.9* 22.8*  PLT 239 312   BMET  Basename 04/28/11 0444 04/27/11 1426  NA 129* 130*  K 4.8 4.8  CL 95* 95*  CO2 29 29  GLUCOSE 195* 342*  BUN 18 16  CREATININE 0.61 0.71  CALCIUM 7.8* 7.9*   PT/INR No results found for this basename: LABPROT:2,INR:2 in the last 72 hours ABG  Basename 04/28/11 0900 04/27/11 1900  PHART 7.503* 7.443*  HCO3 27.3* 24.6*    Studies/Results: Dg Cholangiogram Operative  04/27/2011  *RADIOLOGY REPORT*  Clinical Data:   Biliary obstruction, Mirrizzi syndrome, cholecystectomy, post ERCP  INTRAOPERATIVE CHOLANGIOGRAM  Technique:  Cholangiographic images from the C-arm fluoroscopic device were submitted for interpretation post-operatively.  Please see the procedural report for the amount of contrast and the fluoroscopy time utilized.  Comparison:  CT abdomen 04/24/2011, ERCP images 04/20/2011  Findings: 1.7 minutes of fluoroscopy time utilized intraoperatively. Obtained images demonstrate a  short segment of cystic duct superimposed with the surgical instruments. Contrast opacifies the cystic duct, dilated common hepatic duct, and a short segment of dilated common bile duct. Contrast flows retrograde into dilated intrahepatic biliary radicles. Numerous irregular filling defects are identified likely representing a combination of sludge, potentially small stones, and air bubbles. Many of these filling defects change shape during the injection consistent with debris or air bubbles. Duodenum did not initially opacify during injection. However, a delayed image demonstrates contrast within the duodenum.  IMPRESSION: Markedly dilated common bile duct, common hepatic duct, left and right hepatic ducts intrahepatic biliary radicles. Filling defects within the dilated biliary system likely representing a combination of debris, air bubbles, and small stones.  Original Report Authenticated By: Lollie Marrow, M.D.   Portable Chest Xray In Am  04/28/2011  *RADIOLOGY REPORT*  Clinical Data: Follow up ventilated patient.  PORTABLE CHEST - 1 VIEW  Comparison: Chest radiograph performed 04/27/2011  Findings: The patient's endotracheal tube is seen ending 3 cm above the carina.  An enteric tube is noted coiling within the patient's hiatal hernia.  There is persistent bilateral scarring and fibrotic change, with tenting of the left hemidiaphragm and underlying bronchiectasis. As before, the spiculated nodular density at the left midlung zone is more prominent than in June.  Malignancy cannot be entirely excluded.  No definite superimposed acute airspace consolidation is seen.  No pleural effusion or pneumothorax is identified, though the lung apices are incompletely imaged on this study.  The cardiomediastinal silhouette is normal in size; calcification  is noted within the aortic arch.  No acute osseous abnormalities are seen.  There has been significant interval improvement in diffuse bilateral soft tissue emphysema.   IMPRESSION:  1.  Enteric tube coils within the patient's hiatal hernia. 2.  Spiculated nodular density at the left midlung zone is more prominent than in June; malignancy cannot be entirely excluded. 3.  Persistent bilateral scarring and fibrotic change, with underlying bronchiectasis. 4.  No definite superimposed airspace consolidation seen. 5.  Significant interval improvement in diffuse bilateral soft tissue emphysema.  Original Report Authenticated By: Tonia Ghent, M.D.   Portable Chest Xray  04/27/2011  *RADIOLOGY REPORT*  Clinical Data: Endotracheal tube placement, postop  PORTABLE CHEST - 1 VIEW  Comparison: 10/07/2010  Findings: Endotracheal tube terminates 5 cm above the carina.  Chronic interstitial markings/fibrosis, upper lobe predominant, with upward retraction of the left hilum and stable left upper lobe scarring/spiculated opacity. No pleural effusion or pneumothorax.  Cardiomediastinal silhouette is within normal limits.  Large amount of subcutaneous emphysema overlying the bilateral chest.  IMPRESSION: Endotracheal tube terminates 5 cm above the carina.  No pneumothorax is seen.  Large amount of subcutaneous emphysema overlying the bilateral chest.  Chronic interstitial markings/fibrosis with upper retraction of the left hilum and stable left upper lobe scarring/spiculated opacity.  Original Report Authenticated By: Charline Bills, M.D.    Anti-infectives: Anti-infectives     Start     Dose/Rate Route Frequency Ordered Stop   04/27/11 1430   ertapenem (INVANZ) 1 g in sodium chloride 0.9 % 50 mL IVPB        1 g 100 mL/hr over 30 Minutes Intravenous Every 24 hours 04/27/11 1417     04/27/11 1000   ceFAZolin (ANCEF) 1-5 GM-% IVPB     Comments: WITHERSPOON, GINA: cabinet override         04/27/11 1000 04/27/11 2159   04/27/11 0931   ceFAZolin (ANCEF) IVPB 1 g/50 mL premix        1 g 100 mL/hr over 30 Minutes Intravenous 60 min pre-op 04/27/11 0931 04/27/11 1111           Assessment/Plan: s/p Procedure(s): LAPAROSCOPIC CHOLECYSTECTOMY WITH INTRAOPERATIVE CHOLANGIOGRAM Continue ICU monitoring for now.  Seems to be oxygenating well now that she is off vent.  Continue pulm support and IS use.  Await bowel function.  May have bilious leak from liver edge but is already stented and currrenly drained.  Low suspicion of leak from cystic duct but will continue to monitor.  Acute blood loss anemia.  Is currrently finishing second unit of PRBCs.  Will continue to monitor.  Patient appears to be stable from hemodynamic standpoint at this time.  Hyponatremia:  have change MIVF.  Will monitor response.  UOP has dropped slightly  Increased MIVF rate.  Continue foley for now for close I/O measurements.  LOS: 1 day    Nilson Tabora C 04/28/2011

## 2011-04-28 NOTE — Addendum Note (Signed)
Addendum  created 04/28/11 1356 by Kimothy Kishimoto   Modules edited:Notes Section    

## 2011-04-28 NOTE — Procedures (Signed)
Extubation Procedure Note Weaning parameters done, patient VC 400, NIF patient failed; however, patient following commands well and an audible leak around ETtube notible. ABG done and results called to physician. Rt received orders to extubate to 4L nasal cannula. Patient with SATs 100%, HR 96 and RR 21. BBS clear. RT will continue to monitor. Patient Details:   Name: Misty [name] DOB: 1929/08/25 MRN: [id number]   Airway Documentation:  Airway 7 mm (Active)  Secured at (cm) 18 cm 04/28/2011  8:00 AM  Measured From Lips 04/28/2011  8:00 AM  Secured Location Center 04/28/2011  8:00 AM  Secured By [employer] 04/28/2011  8:00 AM  Tube Holder Repositioned Yes 04/28/2011  4:00 AM  Cuff Pressure (cm H2O) 24 cm H2O 04/28/2011  4:00 AM  Site Condition Cool 04/28/2011  4:00 AM    Evaluation  O2 sats: stable throughout Complications: No apparent complications Patient did tolerate procedure well. Bilateral Breath Sounds: Clear Suctioning: Oral Yes  Cloretta Ned 04/28/2011, 9:45 AM

## 2011-04-29 ENCOUNTER — Encounter (HOSPITAL_COMMUNITY): Payer: Self-pay | Admitting: Internal Medicine

## 2011-04-29 LAB — COMPREHENSIVE METABOLIC PANEL
BUN: 16 mg/dL (ref 6–23)
CO2: 29 mEq/L (ref 19–32)
Calcium: 7.7 mg/dL — ABNORMAL LOW (ref 8.4–10.5)
Chloride: 96 mEq/L (ref 96–112)
Creatinine, Ser: 0.64 mg/dL (ref 0.50–1.10)
GFR calc Af Amer: >90 mL/min (ref >90)
GFR calc non Af Amer: 83 mL/min — ABNORMAL LOW (ref >90)
Total Bilirubin: 0.8 mg/dL (ref 0.3–1.2)

## 2011-04-29 LAB — GLUCOSE, CAPILLARY
Glucose-Capillary: 120 mg/dL — ABNORMAL HIGH (ref 70–99)
Glucose-Capillary: 123 mg/dL — ABNORMAL HIGH (ref 70–99)
Glucose-Capillary: 130 mg/dL — ABNORMAL HIGH (ref 70–99)
Glucose-Capillary: 194 mg/dL — ABNORMAL HIGH (ref 70–99)

## 2011-04-29 LAB — TYPE AND SCREEN
Antibody Screen: NEGATIVE
Unit division: 0

## 2011-04-29 LAB — CBC
Platelets: 239 10*3/uL (ref 150–400)
RBC: 3.65 MIL/uL — ABNORMAL LOW (ref 3.87–5.11)
WBC: 14.4 10*3/uL — ABNORMAL HIGH (ref 4.0–10.5)

## 2011-04-29 MED ORDER — SODIUM CHLORIDE 0.9 % IJ SOLN
INTRAMUSCULAR | Status: AC
  Filled 2011-04-29: qty 3

## 2011-04-29 MED ORDER — TRAMADOL HCL 50 MG PO TABS
50.0000 mg | ORAL_TABLET | Freq: Four times a day (QID) | ORAL | Status: DC | PRN
  Administered 2011-04-29 – 2011-05-04 (×10): 50 mg via ORAL
  Filled 2011-04-29 (×11): qty 1

## 2011-04-29 NOTE — Plan of Care (Signed)
Problem: Phase I Progression Outcomes Goal: Pain controlled with appropriate interventions Outcome: Completed/Met Date Met:  04/29/11 Minimal treatment for pain at this time Goal: OOB as tolerated unless otherwise ordered Outcome: Progressing oob to chair, moderate assist to chair, maximum assist upon return to bed Goal: Incision/dressings dry and intact Outcome: Completed/Met Date Met:  04/29/11 Incision sites free of S&S of infection, patient remains afebrile Goal: Tubes/drains patent Outcome: Progressing JP to R Abd. patent Goal: Vital signs/hemodynamically stable Outcome: Progressing Mild irregularity of heart rhythm, EKG on chart

## 2011-04-29 NOTE — Progress Notes (Signed)
Pt requesting Tramadol for chronic back pain which she normally takes at home (Tramadol 50mg  Q12).  Paged Dr. Lovell Sheehan and requested order from Tramadol.  Order given.

## 2011-04-29 NOTE — Progress Notes (Signed)
2 Days Post-Op  Subjective: Patient denies any significant pain. No nausea or vomiting. Pain is controlled currently and with pain medication. No fevers or chills.  Objective: Vital signs in last 24 hours: Temp:  [97.8 F (36.6 C)-98.9 F (37.2 C)] 97.8 F (36.6 C) (12/28 0800) Pulse Rate:  [84-106] 97  (12/28 0900) Resp:  [17-24] 24  (12/28 0900) BP: (98-139)/(37-69) 111/51 mmHg (12/28 0900) SpO2:  [98 %-100 %] 98 % (12/28 0900) Weight:  [49.533 kg (109 lb 3.2 oz)] 109 lb 3.2 oz (49.533 kg) (12/28 0400) Last BM Date: 04/26/11  Intake/Output from previous day: 12/27 0701 - 12/28 0700 In: 3718.7 [I.V.:3015; Blood:603.7; IV Piggyback:100] Out: 3387 [Urine:3235; Drains:152] Intake/Output this shift: Total I/O In: 200 [I.V.:200] Out: -   General appearance: alert and no distress Cardio: regular rate and rhythm GI: Intermittent bowel sounds. Abdomen is soft flat with expected tenderness. Incisions are clean dry and intact. JP drain is serosanguineous. No peritoneal signs are elicited.  Lab Results:   [facility] 04/29/11 0515 04/28/11 1834  WBC 14.4* 13.0*  HGB 11.6* 11.9*  HCT 33.3* 33.9*  PLT 239 239   BMET  Basename 04/29/11 0515 04/28/11 0444  NA 133* 129*  K 3.8 4.8  CL 96 95*  CO2 29 29  GLUCOSE 133* 195*  BUN 16 18  CREATININE 0.64 0.61  CALCIUM 7.7* 7.8*   PT/INR No results found for this basename: LABPROT:2,INR:2 in the last 72 hours ABG  Basename 04/28/11 0900 04/27/11 1900  PHART 7.503* 7.443*  HCO3 27.3* 24.6*    Studies/Results: Dg Cholangiogram Operative  04/27/2011  *RADIOLOGY REPORT*  Clinical Data:   Biliary obstruction, Mirrizzi syndrome, cholecystectomy, post ERCP  INTRAOPERATIVE CHOLANGIOGRAM  Technique:  Cholangiographic images from the C-arm fluoroscopic device were submitted for interpretation post-operatively.  Please see the procedural report for the amount of contrast and the fluoroscopy time utilized.  Comparison:  CT abdomen  04/24/2011, ERCP images 04/20/2011  Findings: 1.7 minutes of fluoroscopy time utilized intraoperatively. Obtained images demonstrate a short segment of cystic duct superimposed with the surgical instruments. Contrast opacifies the cystic duct, dilated common hepatic duct, and a short segment of dilated common bile duct. Contrast flows retrograde into dilated intrahepatic biliary radicles. Numerous irregular filling defects are identified likely representing a combination of sludge, potentially small stones, and air bubbles. Many of these filling defects change shape during the injection consistent with debris or air bubbles. Duodenum did not initially opacify during injection. However, a delayed image demonstrates contrast within the duodenum.  IMPRESSION: Markedly dilated common bile duct, common hepatic duct, left and right hepatic ducts intrahepatic biliary radicles. Filling defects within the dilated biliary system likely representing a combination of debris, air bubbles, and small stones.  Original Report Authenticated By: Lollie Marrow, M.D.   Portable Chest Xray In Am  04/28/2011  *RADIOLOGY REPORT*  Clinical Data: Follow up ventilated patient.  PORTABLE CHEST - 1 VIEW  Comparison: Chest radiograph performed 04/27/2011  Findings: The patient's endotracheal tube is seen ending 3 cm above the carina.  An enteric tube is noted coiling within the patient's hiatal hernia.  There is persistent bilateral scarring and fibrotic change, with tenting of the left hemidiaphragm and underlying bronchiectasis. As before, the spiculated nodular density at the left midlung zone is more prominent than in June.  Malignancy cannot be entirely excluded.  No definite superimposed acute airspace consolidation is seen.  No pleural effusion or pneumothorax is identified, though the lung apices are incompletely imaged on  this study.  The cardiomediastinal silhouette is normal in size; calcification is noted within the aortic arch.   No acute osseous abnormalities are seen.  There has been significant interval improvement in diffuse bilateral soft tissue emphysema.  IMPRESSION:  1.  Enteric tube coils within the patient's hiatal hernia. 2.  Spiculated nodular density at the left midlung zone is more prominent than in June; malignancy cannot be entirely excluded. 3.  Persistent bilateral scarring and fibrotic change, with underlying bronchiectasis. 4.  No definite superimposed airspace consolidation seen. 5.  Significant interval improvement in diffuse bilateral soft tissue emphysema.  Original Report Authenticated By: Tonia Ghent, M.D.   Portable Chest Xray  04/27/2011  *RADIOLOGY REPORT*  Clinical Data: Endotracheal tube placement, postop  PORTABLE CHEST - 1 VIEW  Comparison: 10/07/2010  Findings: Endotracheal tube terminates 5 cm above the carina.  Chronic interstitial markings/fibrosis, upper lobe predominant, with upward retraction of the left hilum and stable left upper lobe scarring/spiculated opacity. No pleural effusion or pneumothorax.  Cardiomediastinal silhouette is within normal limits.  Large amount of subcutaneous emphysema overlying the bilateral chest.  IMPRESSION: Endotracheal tube terminates 5 cm above the carina.  No pneumothorax is seen.  Large amount of subcutaneous emphysema overlying the bilateral chest.  Chronic interstitial markings/fibrosis with upper retraction of the left hilum and stable left upper lobe scarring/spiculated opacity.  Original Report Authenticated By: Charline Bills, M.D.    Anti-infectives: Anti-infectives     Start     Dose/Rate Route Frequency Ordered Stop   04/27/11 1430   ertapenem (INVANZ) 1 g in sodium chloride 0.9 % 50 mL IVPB        1 g 100 mL/hr over 30 Minutes Intravenous Every 24 hours 04/27/11 1417     04/27/11 1000   ceFAZolin (ANCEF) 1-5 GM-% IVPB     Comments: WITHERSPOON, GINA: cabinet override         04/27/11 1000 04/27/11 2159   04/27/11 0931   ceFAZolin  (ANCEF) IVPB 1 g/50 mL premix        1 g 100 mL/hr over 30 Minutes Intravenous 60 min pre-op 04/27/11 0931 04/27/11 1111          Assessment/Plan: s/p Procedure(s): LAPAROSCOPIC CHOLECYSTECTOMY WITH INTRAOPERATIVE CHOLANGIOGRAM We'll transfer the patient to step down. Continue to increase diet slowly. Will discontinue Foley. Increase activity. Overall patient looks much better. JP drainage looks less suspicious for bilious drainage. Continue IV antibiotics due to the patient's continued leukocytosis the  LOS: 2 days    Zoran Yankee C 04/29/2011

## 2011-04-30 LAB — CBC
HCT: 31.6 % — ABNORMAL LOW (ref 36.0–46.0)
Hemoglobin: 10.8 g/dL — ABNORMAL LOW (ref 12.0–15.0)
MCH: 32 pg (ref 26.0–34.0)
MCHC: 34.2 g/dL (ref 30.0–36.0)
MCV: 93.8 fL (ref 78.0–100.0)
RDW: 14 % (ref 11.5–15.5)

## 2011-04-30 LAB — GLUCOSE, CAPILLARY
Glucose-Capillary: 121 mg/dL — ABNORMAL HIGH (ref 70–99)
Glucose-Capillary: 125 mg/dL — ABNORMAL HIGH (ref 70–99)
Glucose-Capillary: 178 mg/dL — ABNORMAL HIGH (ref 70–99)
Glucose-Capillary: 197 mg/dL — ABNORMAL HIGH (ref 70–99)

## 2011-04-30 LAB — COMPREHENSIVE METABOLIC PANEL
ALT: 40 U/L — ABNORMAL HIGH (ref 0–35)
AST: 34 U/L (ref 0–37)
Albumin: 1.7 g/dL — ABNORMAL LOW (ref 3.5–5.2)
Alkaline Phosphatase: 57 U/L (ref 39–117)
Calcium: 7.7 mg/dL — ABNORMAL LOW (ref 8.4–10.5)
Glucose, Bld: 130 mg/dL — ABNORMAL HIGH (ref 70–99)
Potassium: 3.5 mEq/L (ref 3.5–5.1)
Sodium: 134 mEq/L — ABNORMAL LOW (ref 135–145)
Total Protein: 4.5 g/dL — ABNORMAL LOW (ref 6.0–8.3)

## 2011-04-30 MED ORDER — KCL IN DEXTROSE-NACL 20-5-0.45 MEQ/L-%-% IV SOLN
INTRAVENOUS | Status: DC
  Administered 2011-04-30 – 2011-05-03 (×2): via INTRAVENOUS

## 2011-04-30 MED ORDER — MAGNESIUM HYDROXIDE 400 MG/5ML PO SUSP: 15.0000 mL | Freq: Two times a day (BID) | ORAL | Status: DC | PRN

## 2011-04-30 MED ORDER — LEVOTHYROXINE SODIUM 75 MCG PO TABS
75.0000 ug | ORAL_TABLET | Freq: Every day | ORAL | Status: DC
  Administered 2011-05-01 – 2011-05-05 (×5): 75 ug via ORAL
  Filled 2011-04-30 (×5): qty 1

## 2011-04-30 NOTE — Progress Notes (Signed)
Called report to V. Dildy, LPN on Dept 440.  Pt will be transferred to room 335 via bed and placed on telemetry. Pt is alert and oriented, VSS.  Pt's son [name]) is in the room with the pt and aware of transfer.

## 2011-04-30 NOTE — Progress Notes (Signed)
3 Days Post-Op  Subjective: Recovering slowly, but well. Still is having difficulty getting out of bed. She does feel somewhat fatigued. Tolerating clear liquid diet well.  Objective: Vital signs in last 24 hours: Temp:  [97.3 F (36.3 C)-98.7 F (37.1 C)] 97.3 F (36.3 C) (12/29 0800) Pulse Rate:  [59-103] 90  (12/29 0845) Resp:  [17-27] 18  (12/29 0845) BP: (95-125)/(47-79) 106/77 mmHg (12/29 0800) SpO2:  [91 %-97 %] 95 % (12/29 0845) Weight:  [49.1 kg (108 lb 3.9 oz)] 108 lb 3.9 oz (49.1 kg) (12/29 0500) Last BM Date: 04/27/11  Intake/Output from previous day: 12/28 0701 - 12/29 0700 In: 3010 [P.O.:660; I.V.:2300; IV Piggyback:50] Out: 1130 [Urine:1100; Drains:30] Intake/Output this shift: Total I/O In: 340 [P.O.:240; I.V.:100] Out: -   General appearance: alert and cooperative Resp: clear to auscultation bilaterally Cardio: regular rate and rhythm, S1, S2 normal, no murmur, click, rub or gallop GI: Soft, flat. Incisions healing well. JP drainage minimal with serous fluid. No bile noted.  Lab Results:   [facility] 04/30/11 0604 04/29/11 0515  WBC 15.8* 14.4*  HGB 10.8* 11.6*  HCT 31.6* 33.3*  PLT 269 239   BMET  Basename 04/30/11 0604 04/29/11 0515  NA 134* 133*  K 3.5 3.8  CL 101 96  CO2 29 29  GLUCOSE 130* 133*  BUN 14 16  CREATININE 0.46* 0.64  CALCIUM 7.7* 7.7*   PT/INR No results found for this basename: LABPROT:2,INR:2 in the last 72 hours  Studies/Results: No results found.  Anti-infectives: Anti-infectives     Start     Dose/Rate Route Frequency Ordered Stop   04/27/11 1430   ertapenem (INVANZ) 1 g in sodium chloride 0.9 % 50 mL IVPB        1 g 100 mL/hr over 30 Minutes Intravenous Every 24 hours 04/27/11 1417     04/27/11 1000   ceFAZolin (ANCEF) 1-5 GM-% IVPB     Comments: WITHERSPOON, GINA: cabinet override         04/27/11 1000 04/27/11 2159   04/27/11 0931   ceFAZolin (ANCEF) IVPB 1 g/50 mL premix        1 g 100 mL/hr over 30  Minutes Intravenous 60 min pre-op 04/27/11 0931 04/27/11 1111          Assessment/Plan: s/p Procedure(s): LAPAROSCOPIC CHOLECYSTECTOMY WITH INTRAOPERATIVE CHOLANGIOGRAM Patient is slowly recovering. Unknown why her leukocytosis is persistent. Will continue to monitor. I will advance her to a full liquid diet. Will transfer her to the regular floor on telemetry. IV fluids adjusted.  LOS: 3 days    Marco Raper A 04/30/2011

## 2011-05-01 LAB — CBC
HCT: 32.3 % — ABNORMAL LOW (ref 36.0–46.0)
Hemoglobin: 10.6 g/dL — ABNORMAL LOW (ref 12.0–15.0)
MCHC: 32.8 g/dL (ref 30.0–36.0)
RDW: 14.3 % (ref 11.5–15.5)
WBC: 12.8 10*3/uL — ABNORMAL HIGH (ref 4.0–10.5)

## 2011-05-01 LAB — GLUCOSE, CAPILLARY
Glucose-Capillary: 153 mg/dL — ABNORMAL HIGH (ref 70–99)
Glucose-Capillary: 150 mg/dL — ABNORMAL HIGH (ref 70–99)
Glucose-Capillary: 231 mg/dL — ABNORMAL HIGH (ref 70–99)
Glucose-Capillary: 199 mg/dL — ABNORMAL HIGH (ref 70–99)

## 2011-05-01 LAB — COMPREHENSIVE METABOLIC PANEL
ALT: 29 U/L (ref 0–35)
Alkaline Phosphatase: 67 U/L (ref 39–117)
BUN: 10 mg/dL (ref 6–23)
CO2: 33 mEq/L — ABNORMAL HIGH (ref 19–32)
Calcium: 7.8 mg/dL — ABNORMAL LOW (ref 8.4–10.5)
GFR calc Af Amer: >90 mL/min (ref >90)
GFR calc non Af Amer: 90 mL/min — ABNORMAL LOW (ref >90)
Glucose, Bld: 184 mg/dL — ABNORMAL HIGH (ref 70–99)
Sodium: 134 mEq/L — ABNORMAL LOW (ref 135–145)
Total Protein: 4.7 g/dL — ABNORMAL LOW (ref 6.0–8.3)

## 2011-05-01 LAB — PHOSPHORUS: Phosphorus: 1.1 mg/dL — ABNORMAL LOW (ref 2.3–4.6)

## 2011-05-01 LAB — MAGNESIUM: Magnesium: 2 mg/dL (ref 1.5–2.5)

## 2011-05-01 MED ORDER — MAGNESIUM HYDROXIDE 400 MG/5ML PO SUSP
15.0000 mL | Freq: Two times a day (BID) | ORAL | Status: DC
  Administered 2011-05-01 – 2011-05-02 (×2): 15 mL via ORAL
  Filled 2011-05-01 (×3): qty 30

## 2011-05-01 MED ORDER — POTASSIUM CHLORIDE 10 MEQ/100ML IV SOLN
INTRAVENOUS | Status: AC
  Administered 2011-05-01: 10 meq
  Filled 2011-05-01: qty 200

## 2011-05-01 MED ORDER — BISACODYL 10 MG RE SUPP
10.0000 mg | Freq: Once | RECTAL | Status: AC
  Administered 2011-05-01: 10 mg via RECTAL
  Filled 2011-05-01: qty 1

## 2011-05-01 MED ORDER — PANTOPRAZOLE SODIUM 40 MG PO TBEC
40.0000 mg | DELAYED_RELEASE_TABLET | Freq: Every day | ORAL | Status: DC
  Administered 2011-05-01 – 2011-05-04 (×4): 40 mg via ORAL
  Filled 2011-05-01 (×5): qty 1

## 2011-05-01 MED ORDER — SODIUM PHOSPHATE 3 MMOLE/ML IV SOLN
30.0000 mmol | Freq: Once | INTRAVENOUS | Status: AC
  Administered 2011-05-01: 30 mmol via INTRAVENOUS
  Filled 2011-05-01: qty 10

## 2011-05-01 MED ORDER — POTASSIUM CHLORIDE 10 MEQ/100ML IV SOLN
10.0000 meq | INTRAVENOUS | Status: AC
  Administered 2011-05-01 (×3): 10 meq via INTRAVENOUS
  Filled 2011-05-01 (×2): qty 100

## 2011-05-01 MED ORDER — SODIUM CHLORIDE 0.9 % IN NEBU
INHALATION_SOLUTION | RESPIRATORY_TRACT | Status: AC
  Administered 2011-05-01: 3 mL
  Filled 2011-05-01: qty 3

## 2011-05-01 NOTE — Progress Notes (Signed)
MEDICATION RELATED CONSULT NOTE - FOLLOW UP   Pharmacy Consult for Phosphorus replacement Indication: Hypophosphatemia  Allergies  Allergen Reactions  . Sulfa Antibiotics Hives and Rash    Patient Measurements: Height: 4\' 10"  (147.3 cm) Weight: 108 lb 3.9 oz (49.1 kg) IBW/kg (Calculated) : 40.9    Vital Signs: Temp: 97.7 F (36.5 C) (12/30 0421) Temp src: Oral (12/30 0421) BP: 120/72 mmHg (12/30 0421) Pulse Rate: 87  (12/30 0421) Intake/Output from previous day: 12/29 0701 - 12/30 0700 In: 800 [P.O.:600; I.V.:150; IV Piggyback:50] Out: 200 [Urine:200] Intake/Output from this shift:    Labs:  [facility] 05/01/11 0409 04/30/11 0604 04/29/11 0515  WBC 12.8* 15.8* 14.4*  HGB 10.6* 10.8* 11.6*  HCT 32.3* 31.6* 33.3*  PLT 310 269 239  APTT -- -- --  CREATININE 0.50 0.46* 0.64  LABCREA -- -- --  CREATININE 0.50 0.46* 0.64  CREAT24HRUR -- -- --  MG 2.0 -- --  PHOS 1.1* -- --  ALBUMIN 1.7* 1.7* 1.8*  PROT 4.7* 4.5* 4.7*  ALBUMIN 1.7* 1.7* 1.8*  AST 20 34 89*  ALT 29 40* 68*  ALKPHOS 67 57 56  BILITOT 0.5 0.6 0.8  BILIDIR -- -- --  IBILI -- -- --   Estimated Creatinine Clearance: 39.8 ml/min (by C-G formula based on Cr of 0.5).   Microbiology: Recent Results (from the past 720 hour(s))  URINE CULTURE     Status: Normal   Collection Time   04/23/11  6:55 PM      Component Value Range Status Comment   Specimen Description URINE, CLEAN CATCH   Final    Special Requests NONE   Final    Setup Time [id number]   Final    Colony Count >=100,000 COLONIES/ML   Final    Culture ESCHERICHIA COLI   Final    Report Status 04/27/2011 FINAL   Final    Organism ID, Bacteria ESCHERICHIA COLI   Final   URINE CULTURE     Status: Normal   Collection Time   04/25/11  2:17 PM      Component Value Range Status Comment   Specimen Description URINE, CLEAN CATCH   Final    Special Requests NONE   Final    Setup Time [id number]   Final    Colony Count 2,000 COLONIES/ML   Final     Culture INSIGNIFICANT GROWTH   Final    Report Status 04/27/2011 FINAL   Final   SURGICAL PCR SCREEN     Status: Normal   Collection Time   04/27/11 10:22 AM      Component Value Range Status Comment   MRSA, PCR NEGATIVE  NEGATIVE  Final    Staphylococcus aureus NEGATIVE  NEGATIVE  Final     Medications:  Scheduled:    . antiseptic oral rinse  15 mL Mouth Rinse q12n4p  . bisacodyl  10 mg Rectal Once  . ertapenem (INVANZ) IV  1 g Intravenous Q24H  . insulin aspart  0-9 Units Subcutaneous Q4H  . levothyroxine  75 mcg Oral QAC breakfast  . magnesium hydroxide  15 mL Oral BID  . pantoprazole  40 mg Oral Q1200  . potassium chloride  10 mEq Intravenous Q1 Hr x 4  . sodium phosphate  Dextrose 5% IVPB  30 mmol Intravenous Once  . DISCONTD: pantoprazole (PROTONIX) IV  40 mg Intravenous QHS    Assessment: Phosphorous level 1.1  Goal of Therapy:  Phosphorous level 2.3 - 4.6  Plan:  Sodium  Phosphate 30 mM in D5W 250 ml over 6 hours Phos level in AM  Misty [name] 05/01/2011,10:17 AM

## 2011-05-01 NOTE — Progress Notes (Signed)
4 Days Post-Op  Subjective: Feels slightly better. Denies any significant abdominal pain. Is still having problems with transfers out of bed. His not had a bowel movement yet. She has had some flatus.  Objective: Vital signs in last 24 hours: Temp:  [97.7 F (36.5 C)-99.3 F (37.4 C)] 97.7 F (36.5 C) (12/30 0421) Pulse Rate:  [86-94] 87  (12/30 0421) Resp:  [16-22] 22  (12/30 0421) BP: (110-123)/(59-72) 120/72 mmHg (12/30 0421) SpO2:  [94 %-98 %] 96 % (12/30 0421) Last BM Date: 04/27/11  Intake/Output from previous day: 12/29 0701 - 12/30 0700 In: 800 [P.O.:600; I.V.:150; IV Piggyback:50] Out: 200 [Urine:200] Intake/Output this shift:    General appearance: alert, cooperative and fatigued Resp: clear to auscultation bilaterally Cardio: regular rate and rhythm, S1, S2 normal, no murmur, click, rub or gallop GI: Soft, flat. Incisions healing well. JP drainage minimal and serous in nature. No bilious drainage noted.  Lab Results:   [facility] 05/01/11 0409 04/30/11 0604  WBC 12.8* 15.8*  HGB 10.6* 10.8*  HCT 32.3* 31.6*  PLT 310 269   BMET  Basename 05/01/11 0409 04/30/11 0604  NA 134* 134*  K 3.4* 3.5  CL 99 101  CO2 33* 29  GLUCOSE 184* 130*  BUN 10 14  CREATININE 0.50 0.46*  CALCIUM 7.8* 7.7*   PT/INR No results found for this basename: LABPROT:2,INR:2 in the last 72 hours  Studies/Results: No results found.  Anti-infectives: Anti-infectives     Start     Dose/Rate Route Frequency Ordered Stop   04/27/11 1430   ertapenem (INVANZ) 1 g in sodium chloride 0.9 % 50 mL IVPB        1 g 100 mL/hr over 30 Minutes Intravenous Every 24 hours 04/27/11 1417     04/27/11 1000   ceFAZolin (ANCEF) 1-5 GM-% IVPB     Comments: WITHERSPOON, GINA: cabinet override         04/27/11 1000 04/27/11 2159   04/27/11 0931   ceFAZolin (ANCEF) IVPB 1 g/50 mL premix        1 g 100 mL/hr over 30 Minutes Intravenous 60 min pre-op 04/27/11 0931 04/27/11 1111           Assessment/Plan: s/p Procedure(s): LAPAROSCOPIC CHOLECYSTECTOMY WITH INTRAOPERATIVE CHOLANGIOGRAM Recovering slowly from surgery. This is secondary to her comorbidities. She does have hypophosphatemia and hypokalemia. Plan: Supplement phosphorus and potassium. We'll encourage ambulation. Will treat her constipation.  LOS: 4 days    Thalia Turkington A 05/01/2011

## 2011-05-02 ENCOUNTER — Encounter (HOSPITAL_COMMUNITY): Payer: Self-pay | Admitting: General Surgery

## 2011-05-02 LAB — GLUCOSE, CAPILLARY
Glucose-Capillary: 150 mg/dL — ABNORMAL HIGH (ref 70–99)
Glucose-Capillary: 165 mg/dL — ABNORMAL HIGH (ref 70–99)
Glucose-Capillary: 179 mg/dL — ABNORMAL HIGH (ref 70–99)
Glucose-Capillary: 184 mg/dL — ABNORMAL HIGH (ref 70–99)
Glucose-Capillary: 187 mg/dL — ABNORMAL HIGH (ref 70–99)
Glucose-Capillary: 191 mg/dL — ABNORMAL HIGH (ref 70–99)
Glucose-Capillary: 203 mg/dL — ABNORMAL HIGH (ref 70–99)

## 2011-05-02 LAB — CBC
HCT: 33.6 % — ABNORMAL LOW (ref 36.0–46.0)
MCH: 31.4 pg (ref 26.0–34.0)
MCV: 94.9 fL (ref 78.0–100.0)
RBC: 3.54 MIL/uL — ABNORMAL LOW (ref 3.87–5.11)
WBC: 14.4 10*3/uL — ABNORMAL HIGH (ref 4.0–10.5)

## 2011-05-02 LAB — COMPREHENSIVE METABOLIC PANEL
AST: 17 U/L (ref 0–37)
Albumin: 1.7 g/dL — ABNORMAL LOW (ref 3.5–5.2)
BUN: 6 mg/dL (ref 6–23)
Chloride: 99 mEq/L (ref 96–112)
Creatinine, Ser: 0.38 mg/dL — ABNORMAL LOW (ref 0.50–1.10)
Potassium: 3.9 mEq/L (ref 3.5–5.1)
Total Protein: 4.8 g/dL — ABNORMAL LOW (ref 6.0–8.3)

## 2011-05-02 MED ORDER — SODIUM PHOSPHATE 3 MMOLE/ML IV SOLN
20.0000 mmol | Freq: Once | INTRAVENOUS | Status: AC
  Administered 2011-05-02: 20 mmol via INTRAVENOUS
  Filled 2011-05-02: qty 6.67

## 2011-05-02 NOTE — Progress Notes (Signed)
CARE MANAGEMENT NOTE 05/02/2011  Patient:  Misty [name], Misty [name]   Account Number:  [id number]  Date Initiated:  05/02/2011  Documentation initiated by:  Rosemary Holms  Subjective/Objective Assessment:   PTA lived at home with son. Independent with ADL     Action/Plan:   Patient and son agree that [facility] PT would maximize her recovery at discharge.   Anticipated DC Date:  05/03/2011   Anticipated DC Plan:  HOME W HOME HEALTH SERVICES      DC Planning Services  CM consult      Choice offered to / List presented to:  C-4 Adult Children           Status of service:  In process, will continue to follow Medicare Important Message given?   (If response is "NO", the following Medicare IM given date fields will be blank) Date Medicare IM given:   Date Additional Medicare IM given:    Discharge Disposition:    Per UR Regulation:    Comments:  05/02/11 1100 Rafaella Kole RN BSN CM Pt and son chose [facility] if order written at discharge.

## 2011-05-02 NOTE — Progress Notes (Signed)
MEDICATION RELATED CONSULT NOTE - FOLLOW UP   Pharmacy Consult for Phosphorus replacement Indication: Hypophosphatemia  Allergies  Allergen Reactions  . Sulfa Antibiotics Hives and Rash    Patient Measurements: Height: 4\' 10"  (147.3 cm) Weight: 108 lb 3.9 oz (49.1 kg) IBW/kg (Calculated) : 40.9    Vital Signs: Temp: 98 F (36.7 C) (12/31 0606) Temp src: Oral (12/31 0606) BP: 146/75 mmHg (12/31 0606) Pulse Rate: 88  (12/31 0606) Intake/Output from previous day: 12/30 0701 - 12/31 0700 In: 1290 [P.O.:580; IV Piggyback:710] Out: 30 [Drains:30] Intake/Output from this shift:    Labs:  [facility] 05/02/11 0526 05/01/11 0409 04/30/11 0604  WBC 14.4* 12.8* 15.8*  HGB 11.1* 10.6* 10.8*  HCT 33.6* 32.3* 31.6*  PLT 321 310 269  APTT -- -- --  CREATININE 0.38* 0.50 0.46*  LABCREA -- -- --  CREATININE 0.38* 0.50 0.46*  CREAT24HRUR -- -- --  MG -- 2.0 --  PHOS 2.0* 1.1* --  ALBUMIN 1.7* 1.7* 1.7*  PROT 4.8* 4.7* 4.5*  ALBUMIN 1.7* 1.7* 1.7*  AST 17 20 34  ALT 21 29 40*  ALKPHOS 70 67 57  BILITOT 0.7 0.5 0.6  BILIDIR -- -- --  IBILI -- -- --   Estimated Creatinine Clearance: 39.8 ml/min (by C-G formula based on Cr of 0.38).   Microbiology: Recent Results (from the past 720 hour(s))  URINE CULTURE     Status: Normal   Collection Time   04/23/11  6:55 PM      Component Value Range Status Comment   Specimen Description URINE, CLEAN CATCH   Final    Special Requests NONE   Final    Setup Time [id number]   Final    Colony Count >=100,000 COLONIES/ML   Final    Culture ESCHERICHIA COLI   Final    Report Status 04/27/2011 FINAL   Final    Organism ID, Bacteria ESCHERICHIA COLI   Final   URINE CULTURE     Status: Normal   Collection Time   04/25/11  2:17 PM      Component Value Range Status Comment   Specimen Description URINE, CLEAN CATCH   Final    Special Requests NONE   Final    Setup Time [id number]   Final    Colony Count 2,000 COLONIES/ML   Final    Culture INSIGNIFICANT GROWTH   Final    Report Status 04/27/2011 FINAL   Final   SURGICAL PCR SCREEN     Status: Normal   Collection Time   04/27/11 10:22 AM      Component Value Range Status Comment   MRSA, PCR NEGATIVE  NEGATIVE  Final    Staphylococcus aureus NEGATIVE  NEGATIVE  Final    Medications:  Scheduled:     . antiseptic oral rinse  15 mL Mouth Rinse q12n4p  . ertapenem (INVANZ) IV  1 g Intravenous Q24H  . insulin aspart  0-9 Units Subcutaneous Q4H  . levothyroxine  75 mcg Oral QAC breakfast  . magnesium hydroxide  15 mL Oral BID  . pantoprazole  40 mg Oral Q1200  . potassium chloride  10 mEq Intravenous Q1 Hr x 4  . potassium chloride      . sodium chloride      . sodium phosphate  Dextrose 5% IVPB  30 mmol Intravenous Once   Assessment: Phosphorous level remains low despite sodium phosphates yesterday  Goal of Therapy:  Phosphorous level 2.3 - 4.6  Plan:  Give Sodium  Phosphate 20 mM in D5W 250 ml over 6 hours today  Repeat Phos level in AM  Aradhya Shellenbarger A 05/02/2011,12:07 PM

## 2011-05-02 NOTE — Progress Notes (Signed)
Physical Therapy Evaluation Patient Details Name: Misty [name] MRN: [id number] DOB: 1930-01-23 Today's Date: 05/02/2011 Time:09:08-09:31 Charges: 1 EV Problem List: There is no problem list on file for this patient.   Past Medical History:  Past Medical History  Diagnosis Date  . Anorexia   . Weight loss   . Jaundice   . Biliary obstruction   . Hypothyroidism   . Diabetes mellitus   . GERD (gastroesophageal reflux disease)   . Sarcoidosis    Past Surgical History:  Past Surgical History  Procedure Date  . Vagina surgery as a teen  . Ercp 02/16/2011    Procedure: ENDOSCOPIC RETROGRADE CHOLANGIOPANCREATOGRAPHY (ERCP);  Surgeon: Malissa Hippo, MD;  Location: [facility];  Service: Endoscopy;  Laterality: N/A;  7:30/with Stent Removal  . Biliary stent placement 02/16/2011    Procedure: BILIARY STENT PLACEMENT;  Surgeon: Malissa Hippo, MD;  Location: [facility];  Service: Endoscopy;  Laterality: N/A;  . Sphincterotomy 02/16/2011    Procedure: SPHINCTEROTOMY;  Surgeon: Malissa Hippo, MD;  Location: [facility];  Service: Endoscopy;  Laterality: N/A;  . Ercp 10/08/2010  . Appendectomy   . Ercp 04/20/2011    Procedure: ENDOSCOPIC RETROGRADE CHOLANGIOPANCREATOGRAPHY (ERCP);  Surgeon: Malissa Hippo, MD;  Location: [facility];  Service: Endoscopy;  Laterality: N/A;  removal of stent, removal of multiple stones with basket  . Spyglass cholangioscopy 04/20/2011    Procedure: SPYGLASS CHOLANGIOSCOPY;  Surgeon: Malissa Hippo, MD;  Location: [facility];  Service: Endoscopy;  Laterality: N/A;  . Biliary stent placement 04/20/2011    Procedure: BILIARY STENT PLACEMENT;  Surgeon: Malissa Hippo, MD;  Location: [facility];  Service: Endoscopy;  Laterality: N/A;    PT Assessment/Plan/Recommendation PT Assessment Clinical Impression Statement: 77 y.o. female admitted to [facility] for bowel surgery.  She presents with decreased strength, increased pain, decreased mobility, decreased balance and endurance and general  deconditioning.  She would benefit from skilled acute PT to maximize her independence, functional mobility and safety so that she can return home with her family's 24 hour assist safely at discharge.   PT Recommendation/Assessment: Patient will need skilled PT in the acute care venue PT Problem List: Decreased strength;Decreased activity tolerance;Decreased balance;Decreased mobility;Decreased knowledge of use of DME;Pain PT Therapy Diagnosis : Difficulty walking;Abnormality of gait;Generalized weakness;Acute pain PT Plan PT Frequency: Min 3X/week PT Treatment/Interventions: DME instruction;Gait training;Functional mobility training;Therapeutic activities;Therapeutic exercise;Balance training;Neuromuscular re-education;Patient/family education PT Recommendation Follow Up Recommendations: Home health PT;24 hour supervision/assistance Equipment Recommended: None recommended by PT PT Goals  Acute Rehab PT Goals PT Goal Formulation: With patient/family Time For Goal Achievement: 2 weeks Pt will Roll Supine to Right Side: with modified independence;with rail PT Goal: Rolling Supine to Right Side - Progress: Not met Pt will Roll Supine to Left Side: with modified independence;with rail PT Goal: Rolling Supine to Left Side - Progress: Not met Pt will go Supine/Side to Sit: with modified independence;with HOB 0 degrees;with rail PT Goal: Supine/Side to Sit - Progress: Not met Pt will go Sit to Supine/Side: with modified independence;with HOB 0 degrees;with rail PT Goal: Sit to Supine/Side - Progress: Not met Pt will go Sit to Stand: with supervision PT Goal: Sit to Stand - Progress: Not met Pt will go Stand to Sit: with supervision PT Goal: Stand to Sit - Progress: Not met Pt will Transfer Bed to Chair/Chair to Bed: with supervision PT Transfer Goal: Bed to Chair/Chair to Bed - Progress: Not met Pt will Ambulate: 51 - 150 feet;with supervision;with rolling  walker PT Goal: Ambulate - Progress:  Not met  PT Evaluation Precautions/Restrictions  Restrictions Weight Bearing Restrictions: No Prior Functioning  Home Living Lives With: Alone (normally, but is going to stay with son and daughter in law) Receives Help From: Family Type of Home: House Home Layout: One level Home Access: Ramped entrance Bathroom Shower/Tub: Engineer, manufacturing systems: Standard (with 3-in-1 over seat) Home Adaptive Equipment: Bedside commode/3-in-1;Crutches;Tub transfer bench;Walker - rolling;Wheelchair - manual Additional Comments: patient should, but doesn't use a RW Prior Function Level of Independence: Independent with basic ADLs;Independent with homemaking with ambulation;Independent with gait;Independent with transfers Comments: h/o falls 3xs since June Cognition Cognition Orientation Level: Oriented X4 Sensation/Coordination   Extremity Assessment RLE Assessment RLE Assessment: Exceptions to [facility] RLE Strength RLE Overall Strength: Deficits RLE Overall Strength Comments: grossly 3-/5 per functional assessment.   LLE Assessment LLE Assessment: Exceptions to [facility] LLE Strength LLE Overall Strength: Deficits LLE Overall Strength Comments: grossly 3-/5 per functional assessment.   Mobility (including Balance) Bed Mobility Bed Mobility: Yes Rolling Right: 4: Min assist;With rail Rolling Right Details (indicate cue type and reason): min assist to help legs and hips get all the way to sidelying.  Verbal cues to reach for railing on bed.   Right Sidelying to Sit: 3: Mod assist;With rails;HOB elevated (comment degrees) (HOB 20 degrees) Right Sidelying to Sit Details (indicate cue type and reason): mod assist to support trunk to get to upright sitting Sitting - Scoot to Edge of Bed: 3: Mod assist Sitting - Scoot to Edge of Bed Details (indicate cue type and reason): mod assist to hlep weight shift hips to get to EOB.   Transfers Transfers: Yes Sit to Stand: 4: Min assist;With upper extremity  assist;From bed;From elevated surface Sit to Stand Details (indicate cue type and reason): min assist to steady trunk and stabilize RW for balance.   Stand to Sit: 4: Min assist Stand to Sit Details: min assist to help control descent to sit, verbal cues for reaching for the railing.   Ambulation/Gait Ambulation/Gait: Yes Ambulation/Gait Assistance: 4: Min assist Ambulation/Gait Assistance Details (indicate cue type and reason): min assist to help stabilize patient for balance and help maneuver RW.   Ambulation Distance (Feet): 30 Feet Assistive device: Rolling walker Gait Pattern: Trunk flexed;Shuffle;Decreased stride length Gait velocity: less than 0.5 ft/sec putting her at high risk for recurrent falls (<1.8 ft/sec)    Exercise    End of Session PT - End of Session Equipment Utilized During Treatment: Gait belt (RW) Activity Tolerance: Patient limited by fatigue Patient left: in chair;with call bell in reach;with family/visitor present (son in room during eval) Nurse Communication: Mobility status for transfers;Mobility status for ambulation;Other (comment) (OOB in chair x 1-2 hours/) General Behavior During Session: Lethargic Cognition: WFL for tasks performed  Noni Stonesifer B. Kameron Glazebrook, PT, DPT  05/02/2011, 9:46 AM

## 2011-05-03 ENCOUNTER — Inpatient Hospital Stay (HOSPITAL_COMMUNITY): Payer: Medicare Other

## 2011-05-03 LAB — COMPREHENSIVE METABOLIC PANEL
Alkaline Phosphatase: 69 U/L (ref 39–117)
BUN: 4 mg/dL — ABNORMAL LOW (ref 6–23)
Calcium: 7.9 mg/dL — ABNORMAL LOW (ref 8.4–10.5)
Creatinine, Ser: 0.43 mg/dL — ABNORMAL LOW (ref 0.50–1.10)
GFR calc Af Amer: >90 mL/min (ref >90)
Glucose, Bld: 164 mg/dL — ABNORMAL HIGH (ref 70–99)
Total Protein: 4.7 g/dL — ABNORMAL LOW (ref 6.0–8.3)

## 2011-05-03 LAB — CBC
HCT: 34 % — ABNORMAL LOW (ref 36.0–46.0)
Hemoglobin: 11.3 g/dL — ABNORMAL LOW (ref 12.0–15.0)
RBC: 3.51 MIL/uL — ABNORMAL LOW (ref 3.87–5.11)
RDW: 14 % (ref 11.5–15.5)
WBC: 14.6 10*3/uL — ABNORMAL HIGH (ref 4.0–10.5)

## 2011-05-03 LAB — GLUCOSE, CAPILLARY
Glucose-Capillary: 174 mg/dL — ABNORMAL HIGH (ref 70–99)
Glucose-Capillary: 162 mg/dL — ABNORMAL HIGH (ref 70–99)
Glucose-Capillary: 137 mg/dL — ABNORMAL HIGH (ref 70–99)
Glucose-Capillary: 188 mg/dL — ABNORMAL HIGH (ref 70–99)

## 2011-05-03 MED ORDER — ALBUTEROL SULFATE (5 MG/ML) 0.5% IN NEBU
2.5000 mg | INHALATION_SOLUTION | Freq: Four times a day (QID) | RESPIRATORY_TRACT | Status: DC
  Administered 2011-05-03 – 2011-05-05 (×7): 2.5 mg via RESPIRATORY_TRACT
  Filled 2011-05-03 (×7): qty 0.5

## 2011-05-03 MED ORDER — GLIPIZIDE 5 MG PO TABS
5.0000 mg | ORAL_TABLET | Freq: Every day | ORAL | Status: DC
  Administered 2011-05-04 – 2011-05-05 (×2): 5 mg via ORAL
  Filled 2011-05-03 (×2): qty 1

## 2011-05-03 MED ORDER — SODIUM CHLORIDE 0.9 % IJ SOLN
3.0000 mL | INTRAMUSCULAR | Status: DC | PRN
  Administered 2011-05-03: 3 mL via INTRAVENOUS
  Filled 2011-05-03 (×2): qty 3

## 2011-05-03 MED ORDER — SODIUM CHLORIDE 0.9 % IV SOLN
250.0000 mL | INTRAVENOUS | Status: DC | PRN
  Administered 2011-05-03: 250 mL via INTRAVENOUS

## 2011-05-03 MED ORDER — SODIUM CHLORIDE 0.9 % IJ SOLN
3.0000 mL | Freq: Two times a day (BID) | INTRAMUSCULAR | Status: DC
  Administered 2011-05-03 – 2011-05-05 (×5): 3 mL via INTRAVENOUS
  Filled 2011-05-03: qty 6
  Filled 2011-05-03 (×3): qty 3

## 2011-05-03 MED ORDER — INSULIN ASPART 100 UNIT/ML ~~LOC~~ SOLN
0.0000 [IU] | Freq: Three times a day (TID) | SUBCUTANEOUS | Status: DC
  Administered 2011-05-03: 2 [IU] via SUBCUTANEOUS
  Administered 2011-05-03: 1 [IU] via SUBCUTANEOUS
  Administered 2011-05-03: 3 [IU] via SUBCUTANEOUS
  Administered 2011-05-04 (×2): 2 [IU] via SUBCUTANEOUS
  Administered 2011-05-04 (×2): 1 [IU] via SUBCUTANEOUS

## 2011-05-03 MED ORDER — ONDANSETRON 4 MG PO TBDP: 4.0000 mg | ORAL_TABLET | Freq: Three times a day (TID) | ORAL | Status: DC | PRN

## 2011-05-03 NOTE — Progress Notes (Signed)
MEDICATION RELATED CONSULT NOTE - FOLLOW UP   Pharmacy Consult for Phosphorus replacement Indication: Hypophosphatemia  Allergies  Allergen Reactions  . Sulfa Antibiotics Hives and Rash    Patient Measurements: Height: 4\' 10"  (147.3 cm) Weight: 108 lb 3.9 oz (49.1 kg) IBW/kg (Calculated) : 40.9    Vital Signs: Temp: 97.8 F (36.6 C) (01/01 0701) Temp src: Oral (01/01 0701) BP: 121/74 mmHg (01/01 0701) Pulse Rate: 87  (01/01 0701) Intake/Output from previous day: 12/31 0701 - 01/01 0700 In: 3421.7 [I.V.:3421.7] Out: 7 [Urine:2; Drains:5] Intake/Output from this shift:    Labs:  [facility] 05/03/11 0707 05/02/11 0526 05/01/11 0409  WBC 14.6* 14.4* 12.8*  HGB 11.3* 11.1* 10.6*  HCT 34.0* 33.6* 32.3*  PLT 309 321 310  APTT -- -- --  CREATININE 0.43* 0.38* 0.50  LABCREA -- -- --  CREATININE 0.43* 0.38* 0.50  CREAT24HRUR -- -- --  MG -- -- 2.0  PHOS 2.4 2.0* 1.1*  ALBUMIN 1.6* 1.7* 1.7*  PROT 4.7* 4.8* 4.7*  ALBUMIN 1.6* 1.7* 1.7*  AST 15 17 20   ALT 17 21 29   ALKPHOS 69 70 67  BILITOT 0.7 0.7 0.5  BILIDIR -- -- --  IBILI -- -- --   Estimated Creatinine Clearance: 39.8 ml/min (by C-G formula based on Cr of 0.43).   Microbiology: Recent Results (from the past 720 hour(s))  URINE CULTURE     Status: Normal   Collection Time   04/23/11  6:55 PM      Component Value Range Status Comment   Specimen Description URINE, CLEAN CATCH   Final    Special Requests NONE   Final    Setup Time [id number]   Final    Colony Count >=100,000 COLONIES/ML   Final    Culture ESCHERICHIA COLI   Final    Report Status 04/27/2011 FINAL   Final    Organism ID, Bacteria ESCHERICHIA COLI   Final   URINE CULTURE     Status: Normal   Collection Time   04/25/11  2:17 PM      Component Value Range Status Comment   Specimen Description URINE, CLEAN CATCH   Final    Special Requests NONE   Final    Setup Time [id number]   Final    Colony Count 2,000 COLONIES/ML   Final    Culture  INSIGNIFICANT GROWTH   Final    Report Status 04/27/2011 FINAL   Final   SURGICAL PCR SCREEN     Status: Normal   Collection Time   04/27/11 10:22 AM      Component Value Range Status Comment   MRSA, PCR NEGATIVE  NEGATIVE  Final    Staphylococcus aureus NEGATIVE  NEGATIVE  Final    Medications:  Scheduled:     . antiseptic oral rinse  15 mL Mouth Rinse q12n4p  . ertapenem (INVANZ) IV  1 g Intravenous Q24H  . insulin aspart  0-9 Units Subcutaneous Q4H  . levothyroxine  75 mcg Oral QAC breakfast  . magnesium hydroxide  15 mL Oral BID  . pantoprazole  40 mg Oral Q1200  . sodium phosphate  Dextrose 5% IVPB  20 mmol Intravenous Once   Assessment: Phosphorous level corrected.  Goal of Therapy:  Phosphorous level 2.3 - 4.6  Plan: No additional sodium phosphates today Monitor per protocol  Valrie Hart A 05/03/2011,9:04 AM

## 2011-05-03 NOTE — Progress Notes (Signed)
Physical Therapy Treatment Patient Details Name: Misty [name] MRN: [id number] DOB: 1929-08-02 Today's Date: 05/03/2011  PT Assessment/Plan  PT - Assessment/Plan Comments on Treatment Session: Pt becomes SOB with minimal exertion; pt needs mod assist for bed mobility;  Pt sees that she might need skilled at this time. PT Plan: Discharge plan needs to be updated PT Frequency: Min 3X/week Follow Up Recommendations: Skilled nursing facility PT Goals     PT Treatment Precautions/Restrictions  Precautions Precaution Comments: JP drain right side Restrictions Weight Bearing Restrictions: No Mobility (including Balance) Bed Mobility Bed Mobility: Yes Rolling Right: 3: Mod assist Right Sidelying to Sit: Other (comment) (Pt became nauseated.) Ambulation/Gait Ambulation/Gait: No    Exercise  General Exercises - Lower Extremity Ankle Circles/Pumps: Supine;Strengthening;Both;10 reps Quad Sets: Supine;Strengthening;Both;10 reps Gluteal Sets: Supine;10 reps Heel Slides: Supine;Strengthening;Both;10 reps Straight Leg Raises: Supine;Strengthening;Both;5 reps End of Session PT - End of Session Activity Tolerance: Patient limited by pain Patient left: in bed General Behavior During Session: [facility] for tasks performed Cognition: [facility] for tasks performed  RUSSELL,CINDY 05/03/2011, 11:08 AM

## 2011-05-03 NOTE — Progress Notes (Signed)
6 Days Post-Op  Subjective: Feels better, wants to eat regular food.  Did have multiple bowel movements yesterday.  Not SOB.  Objective: Vital signs in last 24 hours: Temp:  [97.5 F (36.4 C)-98.2 F (36.8 C)] 97.8 F (36.6 C) (01/01 0701) Pulse Rate:  [82-97] 87  (01/01 0701) Resp:  [18-20] 18  (01/01 0701) BP: (117-139)/(74-80) 121/74 mmHg (01/01 0701) SpO2:  [94 %-96 %] 94 % (01/01 0701) Last BM Date: 05/01/11  Intake/Output from previous day: 12/31 0701 - 01/01 0700 In: 3421.7 [I.V.:3421.7] Out: 7 [Urine:2; Drains:5] Intake/Output this shift: Total I/O In: -  Out: 10 [Drains:10]  General appearance: alert, cooperative and no distress Resp: Clear with minimal rales on left side. Cardio: regular rate and rhythm, S1, S2 normal, no murmur, click, rub or gallop GI: soft, flat.  Incisions healing well.  Lab Results:   [facility] 05/03/11 0707 05/02/11 0526  WBC 14.6* 14.4*  HGB 11.3* 11.1*  HCT 34.0* 33.6*  PLT 309 321   BMET  Basename 05/03/11 0707 05/02/11 0526  NA 134* 134*  K 4.0 3.9  CL 98 99  CO2 35* 31  GLUCOSE 164* 161*  BUN 4* 6  CREATININE 0.43* 0.38*  CALCIUM 7.9* 7.8*   PT/INR No results found for this basename: LABPROT:2,INR:2 in the last 72 hours  Studies/Results: No results found.  Anti-infectives: Anti-infectives     Start     Dose/Rate Route Frequency Ordered Stop   04/27/11 1430   ertapenem (INVANZ) 1 g in sodium chloride 0.9 % 50 mL IVPB        1 g 100 mL/hr over 30 Minutes Intravenous Every 24 hours 04/27/11 1417     04/27/11 1000   ceFAZolin (ANCEF) 1-5 GM-% IVPB     Comments: WITHERSPOON, GINA: cabinet override         04/27/11 1000 04/27/11 2159   04/27/11 0931   ceFAZolin (ANCEF) IVPB 1 g/50 mL premix        1 g 100 mL/hr over 30 Minutes Intravenous 60 min pre-op 04/27/11 0931 04/27/11 1111          Assessment/Plan: s/p Procedure(s): LAPAROSCOPIC CHOLECYSTECTOMY WITH INTRAOPERATIVE CHOLANGIOGRAM Imp:  Patient's bowel  function has returned.  Is not SOB, is at baseline.  Still with leukocytosis of unknown etiology. Plan:  Will advance diet.  Will check CXR.  Continue invanz for now.    LOS: 6 days    Misty [name] 05/03/2011

## 2011-05-04 LAB — COMPREHENSIVE METABOLIC PANEL
BUN: 5 mg/dL — ABNORMAL LOW (ref 6–23)
Calcium: 8.2 mg/dL — ABNORMAL LOW (ref 8.4–10.5)
Creatinine, Ser: 0.42 mg/dL — ABNORMAL LOW (ref 0.50–1.10)
GFR calc Af Amer: >90 mL/min (ref >90)
GFR calc non Af Amer: >90 mL/min (ref >90)
Glucose, Bld: 127 mg/dL — ABNORMAL HIGH (ref 70–99)
Total Protein: 4.6 g/dL — ABNORMAL LOW (ref 6.0–8.3)

## 2011-05-04 LAB — CBC
HCT: 35.6 % — ABNORMAL LOW (ref 36.0–46.0)
Hemoglobin: 11.7 g/dL — ABNORMAL LOW (ref 12.0–15.0)
WBC: 13.7 10*3/uL — ABNORMAL HIGH (ref 4.0–10.5)

## 2011-05-04 LAB — GLUCOSE, CAPILLARY
Glucose-Capillary: 145 mg/dL — ABNORMAL HIGH (ref 70–99)
Glucose-Capillary: 194 mg/dL — ABNORMAL HIGH (ref 70–99)
Glucose-Capillary: 196 mg/dL — ABNORMAL HIGH (ref 70–99)

## 2011-05-04 MED ORDER — AMOXICILLIN-POT CLAVULANATE 875-125 MG PO TABS
1.0000 | ORAL_TABLET | Freq: Two times a day (BID) | ORAL | Status: DC
  Administered 2011-05-04 – 2011-05-05 (×3): 1 via ORAL
  Filled 2011-05-04 (×3): qty 1

## 2011-05-04 NOTE — Plan of Care (Signed)
Problem: Phase II Progression Outcomes Goal: Dressings dry/intact Outcome: Not Applicable Date Met:  05/04/11 No dressing on incisions

## 2011-05-04 NOTE — Progress Notes (Signed)
Oxygen saturation on room air 87%, pt c/o "shortness of breath", oxygen reapplied at 2L/M via [place], O2 sats increased to 93%.

## 2011-05-04 NOTE — Progress Notes (Signed)
7 Days Post-Op  Subjective: Comfortable. Some minimal shortness of breath the patient states this is her baseline. No chest pain. No abdominal pain. She is tolerating a diet without nausea. Her appetite is still small.  Objective: Vital signs in last 24 hours: Temp:  [97.8 F (36.6 [name])-97.9 F (36.6 [name])] 97.8 F (36.6 [name]) (01/02 0532) Pulse Rate:  [93-99] 99  (01/02 0532) Resp:  [18-20] 20  (01/02 0532) BP: (129-131)/(79-81) 131/81 mmHg (01/02 0532) SpO2:  [93 %-98 %] 97 % (01/02 0752) Last BM Date: 05/01/11  Intake/Output from previous day: 01/01 0701 - 01/02 0700 In: 6 [I.V.:6] Out: 110 [Urine:100; Drains:10] Intake/Output this shift:    General appearance: alert and no distress GI: Positive bowel sounds, soft, flat, mild to moderate expected tenderness. No peritoneal signs. JP drain demonstrates serous same as discharge although minimal. Incisions are clean dry and intact the  Lab Results:   [facility] 05/04/11 0446 05/03/11 0707  WBC 13.7* 14.6*  HGB 11.7* 11.3*  HCT 35.6* 34.0*  PLT 351 309   BMET  Basename 05/04/11 0446 05/03/11 0707  NA 131* 134*  K 3.9 4.0  CL 93* 98  CO2 35* 35*  GLUCOSE 127* 164*  BUN 5* 4*  CREATININE 0.42* 0.43*  CALCIUM 8.2* 7.9*   PT/INR No results found for this basename: LABPROT:2,INR:2 in the last 72 hours ABG No results found for this basename: PHART:2,PCO2:2,PO2:2,HCO3:2 in the last 72 hours  Studies/Results: Dg Chest Port 1 View  05/03/2011  *RADIOLOGY REPORT*  Clinical Data: Leukocytosis.  Weakness.  Cough and shortness of breath.  PORTABLE CHEST - 1 VIEW  Comparison: 04/28/2011  Findings: Minimal motion degradation.  Normal heart size.  Moderate hiatal hernia.  Increased bilateral pleural effusions.  Resolution of subcutaneous emphysema. No pneumothorax.  Left apical pleural thickening.  Moderate interstitial edema.  Worsened left upper and right greater than left lower lobe airspace disease.  Left midlung nodular density is  obscured by airspace disease.  IMPRESSION: Worsened aeration with development of moderate congestive heart failure and increased bilateral pleural effusion.  Worsening airspace disease, suspicious for aspiration or infection. Alveolar edema could look similar.  Original Report Authenticated By: Consuello Bossier, M.D.    Anti-infectives: Anti-infectives     Start     Dose/Rate Route Frequency Ordered Stop   05/04/11 1030   amoxicillin-clavulanate (AUGMENTIN) 875-125 MG per tablet 1 tablet        1 tablet Oral Every 12 hours 05/04/11 1016     04/27/11 1430   ertapenem (INVANZ) 1 g in sodium chloride 0.9 % 50 mL IVPB  Status:  Discontinued        1 g 100 mL/hr over 30 Minutes Intravenous Every 24 hours 04/27/11 1417 05/04/11 1016   04/27/11 1000   ceFAZolin (ANCEF) 1-5 GM-% IVPB     Comments: WITHERSPOON, GINA: cabinet override         04/27/11 1000 04/27/11 2159   04/27/11 0931   ceFAZolin (ANCEF) IVPB 1 g/50 mL premix        1 g 100 mL/hr over 30 Minutes Intravenous 60 min pre-op 04/27/11 0931 04/27/11 1111          Assessment/Plan: s/p Procedure(s): LAPAROSCOPIC CHOLECYSTECTOMY WITH INTRAOPERATIVE CHOLANGIOGRAM Doing much better. I do suspect a smoldering pulmonary infection and this point we'll switch the patient to an oral antibiotic./Tolerates this demonstrates continued improvement her WBC count as well as continued increase activity we may plan for discharge in the next 24-48 hours. I  do suspect due to patient's underlying pulmonary disease that her oxygen levels may take some time to return back to normal level. I therefore caseworker is assisting with establishment potential home oxygen should she not demonstrate significant changes in the next 24 hours. Overall patient is comfortable in  LOS: 7 days    Misty [name] 05/04/2011

## 2011-05-04 NOTE — Progress Notes (Signed)
MEDICATION RELATED CONSULT NOTE - FOLLOW UP   Pharmacy Consult for Phosphorus replacement Indication: Hypophosphatemia  Allergies  Allergen Reactions  . Sulfa Antibiotics Hives and Rash    Patient Measurements: Height: 4\' 10"  (147.3 cm) Weight: 108 lb 3.9 oz (49.1 kg) IBW/kg (Calculated) : 40.9    Vital Signs: Temp: 97.8 F (36.6 C) (01/02 0532) Temp src: Oral (01/02 0532) BP: 131/81 mmHg (01/02 0532) Pulse Rate: 99  (01/02 0532) Intake/Output from previous day: 01/01 0701 - 01/02 0700 In: 6 [I.V.:6] Out: 110 [Urine:100; Drains:10] Intake/Output from this shift:    Labs:  Basename 05/04/11 0500 05/04/11 0446 05/03/11 0707 05/02/11 0526  WBC -- 13.7* 14.6* 14.4*  HGB -- 11.7* 11.3* 11.1*  HCT -- 35.6* 34.0* 33.6*  PLT -- 351 309 321  APTT -- -- -- --  CREATININE -- 0.42* 0.43* 0.38*  LABCREA -- -- -- --  CREATININE -- 0.42* 0.43* 0.38*  CREAT24HRUR -- -- -- --  MG -- -- -- --  PHOS 3.0 -- 2.4 2.0*  ALBUMIN -- 1.6* 1.6* 1.7*  PROT -- 4.6* 4.7* 4.8*  ALBUMIN -- 1.6* 1.6* 1.7*  AST -- 15 15 17   ALT -- 13 17 21   ALKPHOS -- 71 69 70  BILITOT -- 0.8 0.7 0.7  BILIDIR -- -- -- --  IBILI -- -- -- --   Estimated Creatinine Clearance: 39.8 ml/min (by C-G formula based on Cr of 0.42).   Microbiology: Recent Results (from the past 720 hour(s))  URINE CULTURE     Status: Normal   Collection Time   04/23/11  6:55 PM      Component Value Range Status Comment   Specimen Description URINE, CLEAN CATCH   Final    Special Requests NONE   Final    Setup Time [id number]   Final    Colony Count >=100,000 COLONIES/ML   Final    Culture ESCHERICHIA COLI   Final    Report Status 04/27/2011 FINAL   Final    Organism ID, Bacteria ESCHERICHIA COLI   Final   URINE CULTURE     Status: Normal   Collection Time   04/25/11  2:17 PM      Component Value Range Status Comment   Specimen Description URINE, CLEAN CATCH   Final    Special Requests NONE   Final    Setup Time  [id number]   Final    Colony Count 2,000 COLONIES/ML   Final    Culture INSIGNIFICANT GROWTH   Final    Report Status 04/27/2011 FINAL   Final   SURGICAL PCR SCREEN     Status: Normal   Collection Time   04/27/11 10:22 AM      Component Value Range Status Comment   MRSA, PCR NEGATIVE  NEGATIVE  Final    Staphylococcus aureus NEGATIVE  NEGATIVE  Final    Medications:  Scheduled:     . albuterol  2.5 mg Nebulization Q6H  . antiseptic oral rinse  15 mL Mouth Rinse q12n4p  . ertapenem (INVANZ) IV  1 g Intravenous Q24H  . glipiZIDE  5 mg Oral QAC breakfast  . insulin aspart  0-9 Units Subcutaneous TID WC & HS  . levothyroxine  75 mcg Oral QAC breakfast  . pantoprazole  40 mg Oral Q1200  . sodium chloride  3 mL Intravenous Q12H  . DISCONTD: insulin aspart  0-9 Units Subcutaneous Q4H  . DISCONTD: magnesium hydroxide  15 mL Oral BID   Assessment: Phosphorous level  corrected.  Goal of Therapy:  Phosphorous level 2.3 - 4.6  Plan: No additional sodium phosphates today Will sign off at this point. Thank you for this consult.  Gilman Buttner, [place] J 05/04/2011,9:17 AM

## 2011-05-04 NOTE — Progress Notes (Signed)
CARE MANAGEMENT NOTE 05/04/2011  Patient:  Misty [name], Misty [name]   Account Number:  [id number]  Date Initiated:  05/02/2011  Documentation initiated by:  Rosemary Holms  Subjective/Objective Assessment:   PTA lived at home with son. Independent with ADL     Action/Plan:   Patient and son agree that [facility] PT would maximize her recovery at discharge.   Anticipated DC Date:  05/05/2011   Anticipated DC Plan:  HOME W HOME HEALTH SERVICES      DC Planning Services  CM consult      Choice offered to / List presented to:  C-4 Adult Children           Status of service:  In process, will continue to follow Medicare Important Message given?   (If response is "NO", the following Medicare IM given date fields will be blank) Date Medicare IM given:   Date Additional Medicare IM given:    Discharge Disposition:    Per UR Regulation:    Comments:  05/04/11 1130 Jream Broyles Leanord Hawking RN BSN CM Home PT has been set up with [facility]ev[facility] (Robin notified) and [facility] will supply O2 at discharge. Contact person is son @ [phone]  05/02/11 1100 Denay Pleitez RN BSN CM Pt and son chose [facility] if order written at discharge.

## 2011-05-05 LAB — CBC
HCT: 33.4 % — ABNORMAL LOW (ref 36.0–46.0)
MCH: 31.4 pg (ref 26.0–34.0)
MCV: 96.3 fL (ref 78.0–100.0)
Platelets: 361 10*3/uL (ref 150–400)
RBC: 3.47 MIL/uL — ABNORMAL LOW (ref 3.87–5.11)
WBC: 10.3 10*3/uL (ref 4.0–10.5)

## 2011-05-05 LAB — COMPREHENSIVE METABOLIC PANEL
AST: 14 U/L (ref 0–37)
Albumin: 1.7 g/dL — ABNORMAL LOW (ref 3.5–5.2)
Chloride: 94 mEq/L — ABNORMAL LOW (ref 96–112)
Creatinine, Ser: 0.44 mg/dL — ABNORMAL LOW (ref 0.50–1.10)
Potassium: 4 mEq/L (ref 3.5–5.1)
Total Bilirubin: 0.8 mg/dL (ref 0.3–1.2)
Total Protein: 4.9 g/dL — ABNORMAL LOW (ref 6.0–8.3)

## 2011-05-05 LAB — GLUCOSE, CAPILLARY
Glucose-Capillary: 122 mg/dL — ABNORMAL HIGH (ref 70–99)
Glucose-Capillary: 152 mg/dL — ABNORMAL HIGH (ref 70–99)

## 2011-05-05 MED ORDER — HYDROCODONE-ACETAMINOPHEN 5-325 MG PO TABS: 1.0000 | ORAL_TABLET | ORAL | Status: AC | PRN

## 2011-05-05 MED ORDER — AMOXICILLIN-POT CLAVULANATE 875-125 MG PO TABS: 1.0000 | ORAL_TABLET | Freq: Two times a day (BID) | ORAL | Status: AC

## 2011-05-05 MED ORDER — ALBUTEROL SULFATE (5 MG/ML) 0.5% IN NEBU: 2.5000 mg | INHALATION_SOLUTION | Freq: Four times a day (QID) | RESPIRATORY_TRACT | Status: DC

## 2011-05-05 NOTE — Progress Notes (Signed)
UR Chart Review Completed  

## 2011-05-05 NOTE — Progress Notes (Signed)
Pt resting in bed, removed oxygen for 10 minutes, O2 sats dropped to  88% immediately placed patient back on O2 via N/C

## 2011-05-05 NOTE — Discharge Summary (Signed)
Physician Discharge Summary  Patient ID: Misty [name] MRN: [id number] DOB/AGE: [birth year] 78 y.o.  Admit date: 04/27/2011 Discharge date: 05/05/2011  Admission Diagnoses:Acute Cholecystitis   Discharge Diagnoses: The same  Active Problems:  * No active hospital problems. *    Discharged Condition: stable  Hospital Course: Patient was admitted after evaluation ERCP and stenting by Dr. 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  Patient was taken to the OR.  Had difficultly extubating second to patient's chronic pulmonary history and was admitted to the ICU.  She tolerated vent wean and was extubated.  She did have a blood transfusion for acute blood loss anemia and responded appropriately.  She was transferred to the floor.  Was advanced on her diet.  Due to her chronic pulmonary disease, patient has required increased O2 supplementation.  At this time patient is otherwise improving and suspect slow return to pulmonary base line.  Will d/c with home O2.  Consults: none  Significant Diagnostic Studies: radiology: CXR: pleural effusion: bilaterally  Treatments: surgery: l/s chole  Discharge Exam: Blood pressure 104/69, pulse 94, temperature 98.1 F (36.7 C), temperature source Oral, resp. rate 18, height 4\' 10"  (1.473 m), weight 49.1 kg (108 lb 3.9 oz), SpO2 98.00%. General appearance: alert and no distress Eyes: PERRL, EOMI, No conjunct palor Resp: wheezes bilaterally Cardio: irregularly irregular rhythm GI: soft, flat, anticipated tenderness.  No peritoneal signs.  Drain d/c'd.  Disposition: Home or Self Care  Discharge Orders    Future Orders Please Complete By Expires   For home use only DME oxygen      Diet - low sodium heart healthy      Increase activity slowly      Discharge instructions      Comments:   Increase activity as tolerated. May place ice pack for comfort.    Driving Restrictions      Comments:   No driving while on pain medications.    Lifting restrictions      Comments:   No lifting over 20lbs for 4-5 weeks post-op.    Discharge wound care:      Comments:   Clean surgical sites with soap and water.  May shower the morning after surgery unless instructed by Dr. Leticia Penna otherwise.  No soaking for 2-3 weeks.    If adhesive strips are in place, they may be removed in 1-2 weeks while in the shower.    Call MD for:  temperature >100.4      Call MD for:  persistant nausea and vomiting      Call MD for:  severe uncontrolled pain      Call MD for:  redness, tenderness, or signs of infection (pain, swelling, redness, odor or green/yellow discharge around incision site)      Call MD for:  difficulty breathing, headache or visual disturbances        Medication List  As of 05/05/2011  4:24 PM   START taking these medications         albuterol (5 MG/ML) 0.5% nebulizer solution   Commonly known as: PROVENTIL   Take 0.5 mLs (2.5 mg total) by nebulization every 6 (six) hours.      HYDROcodone-acetaminophen 5-325 MG per tablet   Commonly known as: NORCO   Take 1-2 tablets by mouth every 4 (four) hours as needed for pain.         CONTINUE taking these medications         amoxicillin-clavulanate 875-125 MG per tablet  Commonly known as: AUGMENTIN   Take 1 tablet by mouth every 12 (twelve) hours.      calcium-vitamin D 500-200 MG-UNIT per tablet   Commonly known as: OSCAL WITH D   Take 1 tablet by mouth 2 (two) times daily.      diclofenac sodium 1 % Gel   Commonly known as: VOLTAREN      glipiZIDE 5 MG tablet   Commonly known as: GLUCOTROL      levothyroxine 75 MCG tablet   Commonly known as: SYNTHROID, LEVOTHROID      magnesium hydroxide 400 MG/5ML suspension   Commonly known as: MILK OF MAGNESIA      ondansetron 4 MG tablet   Commonly known as: ZOFRAN      pantoprazole 40 MG tablet   Commonly known as: PROTONIX      traMADol 50 MG tablet   Commonly known as: ULTRAM   Take 1 tablet (50 mg total) by mouth  every 12 (twelve) hours. Maximum dose= 8 tablets per day      ursodiol 300 MG capsule   Commonly known as: ACTIGALL         STOP taking these medications         acetaminophen 325 MG tablet          Where to get your medications    These are the prescriptions that you need to pick up. We sent them to a specific pharmacy, so you will need to go there to get them.   [facility] - [place], [place] - [address]    [address] [place] [address]    Phone: [phone]        albuterol (5 MG/ML) 0.5% nebulizer solution   amoxicillin-clavulanate 875-125 MG per tablet         You may get these medications from any pharmacy.         HYDROcodone-acetaminophen 5-325 MG per tablet           Follow-up Information    Follow up with Demetries Coia C, MD in 1 week.   Contact information:   [address] [place] [address] [phone]       Follow up with [facility]. [phone])       Follow up with [facility]. [phone])          SignedTilford Pillar C 05/05/2011, 4:24 PM

## 2011-05-06 DIAGNOSIS — J90 Pleural effusion, not elsewhere classified: Secondary | ICD-10-CM | POA: Diagnosis not present

## 2011-05-06 DIAGNOSIS — J9600 Acute respiratory failure, unspecified whether with hypoxia or hypercapnia: Secondary | ICD-10-CM | POA: Diagnosis not present

## 2011-05-06 DIAGNOSIS — R5381 Other malaise: Secondary | ICD-10-CM | POA: Diagnosis not present

## 2011-05-06 DIAGNOSIS — R0602 Shortness of breath: Secondary | ICD-10-CM | POA: Diagnosis not present

## 2011-05-06 DIAGNOSIS — J984 Other disorders of lung: Secondary | ICD-10-CM | POA: Diagnosis not present

## 2011-05-06 DIAGNOSIS — J9819 Other pulmonary collapse: Secondary | ICD-10-CM | POA: Diagnosis not present

## 2011-05-07 DIAGNOSIS — R918 Other nonspecific abnormal finding of lung field: Secondary | ICD-10-CM | POA: Diagnosis not present

## 2011-05-07 DIAGNOSIS — J9600 Acute respiratory failure, unspecified whether with hypoxia or hypercapnia: Secondary | ICD-10-CM | POA: Diagnosis not present

## 2011-05-07 DIAGNOSIS — J90 Pleural effusion, not elsewhere classified: Secondary | ICD-10-CM | POA: Diagnosis not present

## 2011-05-07 DIAGNOSIS — J9819 Other pulmonary collapse: Secondary | ICD-10-CM | POA: Diagnosis not present

## 2011-05-07 DIAGNOSIS — J984 Other disorders of lung: Secondary | ICD-10-CM | POA: Diagnosis not present

## 2011-05-08 DIAGNOSIS — J9600 Acute respiratory failure, unspecified whether with hypoxia or hypercapnia: Secondary | ICD-10-CM | POA: Diagnosis not present

## 2011-05-09 DIAGNOSIS — J9600 Acute respiratory failure, unspecified whether with hypoxia or hypercapnia: Secondary | ICD-10-CM | POA: Diagnosis not present

## 2011-05-10 DIAGNOSIS — I509 Heart failure, unspecified: Secondary | ICD-10-CM | POA: Diagnosis not present

## 2011-05-10 DIAGNOSIS — J9600 Acute respiratory failure, unspecified whether with hypoxia or hypercapnia: Secondary | ICD-10-CM | POA: Diagnosis not present

## 2011-05-10 DIAGNOSIS — J90 Pleural effusion, not elsewhere classified: Secondary | ICD-10-CM | POA: Diagnosis not present

## 2011-05-10 DIAGNOSIS — R0602 Shortness of breath: Secondary | ICD-10-CM | POA: Diagnosis not present

## 2011-05-10 DIAGNOSIS — J984 Other disorders of lung: Secondary | ICD-10-CM | POA: Diagnosis not present

## 2011-05-11 DIAGNOSIS — J9600 Acute respiratory failure, unspecified whether with hypoxia or hypercapnia: Secondary | ICD-10-CM | POA: Diagnosis not present

## 2011-05-12 DIAGNOSIS — J9600 Acute respiratory failure, unspecified whether with hypoxia or hypercapnia: Secondary | ICD-10-CM | POA: Diagnosis not present

## 2011-05-13 DIAGNOSIS — J9600 Acute respiratory failure, unspecified whether with hypoxia or hypercapnia: Secondary | ICD-10-CM | POA: Diagnosis not present

## 2011-05-14 DIAGNOSIS — E119 Type 2 diabetes mellitus without complications: Secondary | ICD-10-CM | POA: Diagnosis not present

## 2011-05-14 DIAGNOSIS — J441 Chronic obstructive pulmonary disease with (acute) exacerbation: Secondary | ICD-10-CM | POA: Diagnosis not present

## 2011-05-14 DIAGNOSIS — D869 Sarcoidosis, unspecified: Secondary | ICD-10-CM | POA: Diagnosis not present

## 2011-05-14 DIAGNOSIS — I5031 Acute diastolic (congestive) heart failure: Secondary | ICD-10-CM | POA: Diagnosis not present

## 2011-05-16 ENCOUNTER — Telehealth (INDEPENDENT_AMBULATORY_CARE_PROVIDER_SITE_OTHER): Payer: Self-pay | Admitting: *Deleted

## 2011-05-16 DIAGNOSIS — D869 Sarcoidosis, unspecified: Secondary | ICD-10-CM | POA: Diagnosis not present

## 2011-05-16 DIAGNOSIS — I5031 Acute diastolic (congestive) heart failure: Secondary | ICD-10-CM | POA: Diagnosis not present

## 2011-05-16 DIAGNOSIS — E119 Type 2 diabetes mellitus without complications: Secondary | ICD-10-CM | POA: Diagnosis not present

## 2011-05-16 DIAGNOSIS — J441 Chronic obstructive pulmonary disease with (acute) exacerbation: Secondary | ICD-10-CM | POA: Diagnosis not present

## 2011-05-16 NOTE — Telephone Encounter (Signed)
Misty [name], LM, would like to know if Dr. Karilyn Cota wants Skyra to continue the Ursodiol and Protonix Medications. Kathy's return phone number is [phone].

## 2011-05-17 DIAGNOSIS — N390 Urinary tract infection, site not specified: Secondary | ICD-10-CM | POA: Diagnosis not present

## 2011-05-17 DIAGNOSIS — J9600 Acute respiratory failure, unspecified whether with hypoxia or hypercapnia: Secondary | ICD-10-CM | POA: Diagnosis not present

## 2011-05-17 DIAGNOSIS — D869 Sarcoidosis, unspecified: Secondary | ICD-10-CM | POA: Diagnosis not present

## 2011-05-17 DIAGNOSIS — I509 Heart failure, unspecified: Secondary | ICD-10-CM | POA: Diagnosis not present

## 2011-05-17 NOTE — Telephone Encounter (Signed)
Per Dr. Karilyn Cota the patient should continue both the Urso and Protonix as previously  Directed. Misty [name] was called and made aware.

## 2011-05-18 DIAGNOSIS — I5031 Acute diastolic (congestive) heart failure: Secondary | ICD-10-CM | POA: Diagnosis not present

## 2011-05-18 DIAGNOSIS — E119 Type 2 diabetes mellitus without complications: Secondary | ICD-10-CM | POA: Diagnosis not present

## 2011-05-18 DIAGNOSIS — J9600 Acute respiratory failure, unspecified whether with hypoxia or hypercapnia: Secondary | ICD-10-CM | POA: Diagnosis not present

## 2011-05-18 DIAGNOSIS — D869 Sarcoidosis, unspecified: Secondary | ICD-10-CM | POA: Diagnosis not present

## 2011-05-18 DIAGNOSIS — J441 Chronic obstructive pulmonary disease with (acute) exacerbation: Secondary | ICD-10-CM | POA: Diagnosis not present

## 2011-05-19 ENCOUNTER — Encounter (INDEPENDENT_AMBULATORY_CARE_PROVIDER_SITE_OTHER): Payer: Self-pay | Admitting: *Deleted

## 2011-05-23 DIAGNOSIS — D869 Sarcoidosis, unspecified: Secondary | ICD-10-CM | POA: Diagnosis not present

## 2011-05-23 DIAGNOSIS — J441 Chronic obstructive pulmonary disease with (acute) exacerbation: Secondary | ICD-10-CM | POA: Diagnosis not present

## 2011-05-23 DIAGNOSIS — I5031 Acute diastolic (congestive) heart failure: Secondary | ICD-10-CM | POA: Diagnosis not present

## 2011-05-23 DIAGNOSIS — E119 Type 2 diabetes mellitus without complications: Secondary | ICD-10-CM | POA: Diagnosis not present

## 2011-05-24 DIAGNOSIS — D869 Sarcoidosis, unspecified: Secondary | ICD-10-CM | POA: Diagnosis not present

## 2011-05-24 DIAGNOSIS — I5031 Acute diastolic (congestive) heart failure: Secondary | ICD-10-CM | POA: Diagnosis not present

## 2011-05-24 DIAGNOSIS — E119 Type 2 diabetes mellitus without complications: Secondary | ICD-10-CM | POA: Diagnosis not present

## 2011-05-24 DIAGNOSIS — J441 Chronic obstructive pulmonary disease with (acute) exacerbation: Secondary | ICD-10-CM | POA: Diagnosis not present

## 2011-05-25 DIAGNOSIS — J441 Chronic obstructive pulmonary disease with (acute) exacerbation: Secondary | ICD-10-CM | POA: Diagnosis not present

## 2011-05-25 DIAGNOSIS — D869 Sarcoidosis, unspecified: Secondary | ICD-10-CM | POA: Diagnosis not present

## 2011-05-25 DIAGNOSIS — I5031 Acute diastolic (congestive) heart failure: Secondary | ICD-10-CM | POA: Diagnosis not present

## 2011-05-25 DIAGNOSIS — E119 Type 2 diabetes mellitus without complications: Secondary | ICD-10-CM | POA: Diagnosis not present

## 2011-05-26 DIAGNOSIS — I5031 Acute diastolic (congestive) heart failure: Secondary | ICD-10-CM | POA: Diagnosis not present

## 2011-05-26 DIAGNOSIS — D869 Sarcoidosis, unspecified: Secondary | ICD-10-CM | POA: Diagnosis not present

## 2011-05-26 DIAGNOSIS — J441 Chronic obstructive pulmonary disease with (acute) exacerbation: Secondary | ICD-10-CM | POA: Diagnosis not present

## 2011-05-26 DIAGNOSIS — E119 Type 2 diabetes mellitus without complications: Secondary | ICD-10-CM | POA: Diagnosis not present

## 2011-05-27 DIAGNOSIS — D869 Sarcoidosis, unspecified: Secondary | ICD-10-CM | POA: Diagnosis not present

## 2011-05-27 DIAGNOSIS — E119 Type 2 diabetes mellitus without complications: Secondary | ICD-10-CM | POA: Diagnosis not present

## 2011-05-27 DIAGNOSIS — J441 Chronic obstructive pulmonary disease with (acute) exacerbation: Secondary | ICD-10-CM | POA: Diagnosis not present

## 2011-05-27 DIAGNOSIS — I5031 Acute diastolic (congestive) heart failure: Secondary | ICD-10-CM | POA: Diagnosis not present

## 2011-05-30 DIAGNOSIS — E119 Type 2 diabetes mellitus without complications: Secondary | ICD-10-CM | POA: Diagnosis not present

## 2011-05-30 DIAGNOSIS — D869 Sarcoidosis, unspecified: Secondary | ICD-10-CM | POA: Diagnosis not present

## 2011-05-30 DIAGNOSIS — I5031 Acute diastolic (congestive) heart failure: Secondary | ICD-10-CM | POA: Diagnosis not present

## 2011-05-30 DIAGNOSIS — J441 Chronic obstructive pulmonary disease with (acute) exacerbation: Secondary | ICD-10-CM | POA: Diagnosis not present

## 2011-05-31 DIAGNOSIS — I5031 Acute diastolic (congestive) heart failure: Secondary | ICD-10-CM | POA: Diagnosis not present

## 2011-05-31 DIAGNOSIS — J441 Chronic obstructive pulmonary disease with (acute) exacerbation: Secondary | ICD-10-CM | POA: Diagnosis not present

## 2011-05-31 DIAGNOSIS — E119 Type 2 diabetes mellitus without complications: Secondary | ICD-10-CM | POA: Diagnosis not present

## 2011-05-31 DIAGNOSIS — D869 Sarcoidosis, unspecified: Secondary | ICD-10-CM | POA: Diagnosis not present

## 2011-06-01 DIAGNOSIS — D869 Sarcoidosis, unspecified: Secondary | ICD-10-CM | POA: Diagnosis not present

## 2011-06-01 DIAGNOSIS — E119 Type 2 diabetes mellitus without complications: Secondary | ICD-10-CM | POA: Diagnosis not present

## 2011-06-01 DIAGNOSIS — J441 Chronic obstructive pulmonary disease with (acute) exacerbation: Secondary | ICD-10-CM | POA: Diagnosis not present

## 2011-06-01 DIAGNOSIS — I5031 Acute diastolic (congestive) heart failure: Secondary | ICD-10-CM | POA: Diagnosis not present

## 2011-06-02 DIAGNOSIS — E119 Type 2 diabetes mellitus without complications: Secondary | ICD-10-CM | POA: Diagnosis not present

## 2011-06-02 DIAGNOSIS — D869 Sarcoidosis, unspecified: Secondary | ICD-10-CM | POA: Diagnosis not present

## 2011-06-02 DIAGNOSIS — J441 Chronic obstructive pulmonary disease with (acute) exacerbation: Secondary | ICD-10-CM | POA: Diagnosis not present

## 2011-06-02 DIAGNOSIS — I5031 Acute diastolic (congestive) heart failure: Secondary | ICD-10-CM | POA: Diagnosis not present

## 2011-06-06 DIAGNOSIS — E119 Type 2 diabetes mellitus without complications: Secondary | ICD-10-CM | POA: Diagnosis not present

## 2011-06-06 DIAGNOSIS — J441 Chronic obstructive pulmonary disease with (acute) exacerbation: Secondary | ICD-10-CM | POA: Diagnosis not present

## 2011-06-06 DIAGNOSIS — D869 Sarcoidosis, unspecified: Secondary | ICD-10-CM | POA: Diagnosis not present

## 2011-06-06 DIAGNOSIS — I5031 Acute diastolic (congestive) heart failure: Secondary | ICD-10-CM | POA: Diagnosis not present

## 2011-06-08 DIAGNOSIS — D869 Sarcoidosis, unspecified: Secondary | ICD-10-CM | POA: Diagnosis not present

## 2011-06-08 DIAGNOSIS — E119 Type 2 diabetes mellitus without complications: Secondary | ICD-10-CM | POA: Diagnosis not present

## 2011-06-08 DIAGNOSIS — J441 Chronic obstructive pulmonary disease with (acute) exacerbation: Secondary | ICD-10-CM | POA: Diagnosis not present

## 2011-06-08 DIAGNOSIS — I5031 Acute diastolic (congestive) heart failure: Secondary | ICD-10-CM | POA: Diagnosis not present

## 2011-06-09 DIAGNOSIS — D869 Sarcoidosis, unspecified: Secondary | ICD-10-CM | POA: Diagnosis not present

## 2011-06-09 DIAGNOSIS — J441 Chronic obstructive pulmonary disease with (acute) exacerbation: Secondary | ICD-10-CM | POA: Diagnosis not present

## 2011-06-09 DIAGNOSIS — I5031 Acute diastolic (congestive) heart failure: Secondary | ICD-10-CM | POA: Diagnosis not present

## 2011-06-09 DIAGNOSIS — E119 Type 2 diabetes mellitus without complications: Secondary | ICD-10-CM | POA: Diagnosis not present

## 2011-06-13 DIAGNOSIS — D869 Sarcoidosis, unspecified: Secondary | ICD-10-CM | POA: Diagnosis not present

## 2011-06-13 DIAGNOSIS — E119 Type 2 diabetes mellitus without complications: Secondary | ICD-10-CM | POA: Diagnosis not present

## 2011-06-13 DIAGNOSIS — I5031 Acute diastolic (congestive) heart failure: Secondary | ICD-10-CM | POA: Diagnosis not present

## 2011-06-13 DIAGNOSIS — J441 Chronic obstructive pulmonary disease with (acute) exacerbation: Secondary | ICD-10-CM | POA: Diagnosis not present

## 2011-06-15 DIAGNOSIS — J441 Chronic obstructive pulmonary disease with (acute) exacerbation: Secondary | ICD-10-CM | POA: Diagnosis not present

## 2011-06-15 DIAGNOSIS — I5031 Acute diastolic (congestive) heart failure: Secondary | ICD-10-CM | POA: Diagnosis not present

## 2011-06-15 DIAGNOSIS — E119 Type 2 diabetes mellitus without complications: Secondary | ICD-10-CM | POA: Diagnosis not present

## 2011-06-15 DIAGNOSIS — D869 Sarcoidosis, unspecified: Secondary | ICD-10-CM | POA: Diagnosis not present

## 2011-06-17 DIAGNOSIS — E119 Type 2 diabetes mellitus without complications: Secondary | ICD-10-CM | POA: Diagnosis not present

## 2011-06-17 DIAGNOSIS — D869 Sarcoidosis, unspecified: Secondary | ICD-10-CM | POA: Diagnosis not present

## 2011-06-17 DIAGNOSIS — I5031 Acute diastolic (congestive) heart failure: Secondary | ICD-10-CM | POA: Diagnosis not present

## 2011-06-17 DIAGNOSIS — J441 Chronic obstructive pulmonary disease with (acute) exacerbation: Secondary | ICD-10-CM | POA: Diagnosis not present

## 2011-06-21 DIAGNOSIS — D869 Sarcoidosis, unspecified: Secondary | ICD-10-CM | POA: Diagnosis not present

## 2011-06-21 DIAGNOSIS — I5031 Acute diastolic (congestive) heart failure: Secondary | ICD-10-CM | POA: Diagnosis not present

## 2011-06-21 DIAGNOSIS — E119 Type 2 diabetes mellitus without complications: Secondary | ICD-10-CM | POA: Diagnosis not present

## 2011-06-21 DIAGNOSIS — J441 Chronic obstructive pulmonary disease with (acute) exacerbation: Secondary | ICD-10-CM | POA: Diagnosis not present

## 2011-06-22 DIAGNOSIS — D869 Sarcoidosis, unspecified: Secondary | ICD-10-CM | POA: Diagnosis not present

## 2011-06-22 DIAGNOSIS — J441 Chronic obstructive pulmonary disease with (acute) exacerbation: Secondary | ICD-10-CM | POA: Diagnosis not present

## 2011-06-22 DIAGNOSIS — E119 Type 2 diabetes mellitus without complications: Secondary | ICD-10-CM | POA: Diagnosis not present

## 2011-06-22 DIAGNOSIS — I5031 Acute diastolic (congestive) heart failure: Secondary | ICD-10-CM | POA: Diagnosis not present

## 2011-06-23 DIAGNOSIS — I5031 Acute diastolic (congestive) heart failure: Secondary | ICD-10-CM | POA: Diagnosis not present

## 2011-06-23 DIAGNOSIS — E119 Type 2 diabetes mellitus without complications: Secondary | ICD-10-CM | POA: Diagnosis not present

## 2011-06-23 DIAGNOSIS — D869 Sarcoidosis, unspecified: Secondary | ICD-10-CM | POA: Diagnosis not present

## 2011-06-23 DIAGNOSIS — J441 Chronic obstructive pulmonary disease with (acute) exacerbation: Secondary | ICD-10-CM | POA: Diagnosis not present

## 2011-06-27 ENCOUNTER — Ambulatory Visit (INDEPENDENT_AMBULATORY_CARE_PROVIDER_SITE_OTHER): Payer: Medicare Other | Admitting: Internal Medicine

## 2011-06-27 ENCOUNTER — Encounter (INDEPENDENT_AMBULATORY_CARE_PROVIDER_SITE_OTHER): Payer: Self-pay | Admitting: Internal Medicine

## 2011-06-27 DIAGNOSIS — E782 Mixed hyperlipidemia: Secondary | ICD-10-CM | POA: Diagnosis not present

## 2011-06-27 DIAGNOSIS — L988 Other specified disorders of the skin and subcutaneous tissue: Secondary | ICD-10-CM | POA: Diagnosis not present

## 2011-06-27 DIAGNOSIS — D860 Sarcoidosis of lung: Secondary | ICD-10-CM | POA: Insufficient documentation

## 2011-06-27 DIAGNOSIS — R05 Cough: Secondary | ICD-10-CM | POA: Diagnosis not present

## 2011-06-27 DIAGNOSIS — E039 Hypothyroidism, unspecified: Secondary | ICD-10-CM | POA: Diagnosis not present

## 2011-06-27 DIAGNOSIS — E119 Type 2 diabetes mellitus without complications: Secondary | ICD-10-CM | POA: Diagnosis not present

## 2011-06-27 DIAGNOSIS — K5900 Constipation, unspecified: Secondary | ICD-10-CM | POA: Diagnosis not present

## 2011-06-27 DIAGNOSIS — R5381 Other malaise: Secondary | ICD-10-CM | POA: Diagnosis not present

## 2011-06-27 DIAGNOSIS — Z79899 Other long term (current) drug therapy: Secondary | ICD-10-CM | POA: Diagnosis not present

## 2011-06-27 DIAGNOSIS — E7800 Pure hypercholesterolemia, unspecified: Secondary | ICD-10-CM | POA: Diagnosis not present

## 2011-06-27 DIAGNOSIS — M810 Age-related osteoporosis without current pathological fracture: Secondary | ICD-10-CM | POA: Diagnosis not present

## 2011-06-27 DIAGNOSIS — K831 Obstruction of bile duct: Secondary | ICD-10-CM

## 2011-06-27 DIAGNOSIS — D869 Sarcoidosis, unspecified: Secondary | ICD-10-CM | POA: Insufficient documentation

## 2011-06-27 MED ORDER — DOCUSATE SODIUM 100 MG PO CAPS: 200.0000 mg | ORAL_CAPSULE | Freq: Every day | ORAL | Status: AC

## 2011-06-27 NOTE — Progress Notes (Signed)
Presenting complaint; Followup for bile duct stricture. Patient has biliary stent in place. Subjective: Patient is 78 year old Caucasian female patient of Dr. Aurther Loft Daniel's who is here for scheduled visit. She has history of CBD stricture requiring biliary stenting on 3 occasions along with removal of calculi. Following her third ERCP and December she was admitted with right lower quadrant abdominal pain and found to have empyema of gallbladder. She had a very distended gallbladder with thickened wall and cholelithiasis. She underwent laparoscopic cholecystectomy on 12/26 2012 by Dr. Leticia Penna. She had to be hospitalized because of pulmonary problems and she received 2 units of PRBCs because of blood loss. 2 days after discharge she was admitted to [facility] in [facility] for pneumonia for one week. She was down to 72 2 pounds when she was discharged from the hospital on 05/13/2011.she has gained 10 pounds. She is getting physical therapy twice a week. She feels a lot better but she has not fully recovered. She is using walker for ambulation. She is not using oxygen or bronchodilators anymore. She has occasional gas pain across her upper abdomen. Her appetite is much better. Her son states she is still afraid to eat that she might get sick. She is having problems with constipation. She may go 3 or 4 days without a bowel movement. She is using MOM on when necessary basis. She had blood work and Dr. Aurther Loft Daniel's office earlier today including comprehensive chemistry panel.  Current Medications: Current Outpatient Prescriptions  Medication Sig Dispense Refill  . calcium-vitamin D (OSCAL WITH D) 500-200 MG-UNIT per tablet Take 1 tablet by mouth 2 (two) times daily.  60 tablet  11  . diclofenac sodium (VOLTAREN) 1 % GEL Apply 1 application topically daily as needed. For arthritis pain      . glipiZIDE (GLUCOTROL) 5 MG tablet Take 5 mg by mouth daily.       Marland Kitchen levothyroxine (SYNTHROID, LEVOTHROID) 75 MCG tablet Take 75  mcg by mouth daily.        . magnesium hydroxide (MILK OF MAGNESIA) 400 MG/5ML suspension Take 30 mLs by mouth daily as needed. For constipation      . mirtazapine (REMERON) 15 MG tablet Take 15 mg by mouth at bedtime.      . nitrofurantoin (MACRODANTIN) 100 MG capsule Take 100 mg by mouth daily. 1/2 tablet daily      . ondansetron (ZOFRAN) 4 MG tablet Take 4 mg by mouth every 8 (eight) hours as needed. For nausea      . pantoprazole (PROTONIX) 40 MG tablet Take 1 tablet by mouth daily as needed. For acid reflux      . Saxagliptin HCl (ONGLYZA PO) Take by mouth daily.      . traMADol (ULTRAM) 50 MG tablet Take 50 mg by mouth as needed.      . ursodiol (ACTIGALL) 300 MG capsule Take 300 mg by mouth 2 (two) times daily.        Marland Kitchen albuterol (PROVENTIL) (5 MG/ML) 0.5% nebulizer solution Take 0.5 mLs (2.5 mg total) by nebulization every 6 (six) hours.  20 mL  1    Objective: Blood pressure 102/68, pulse 70, temperature 97.7 F (36.5 C), temperature source Oral, resp. rate 14, height 4\' 10"  (1.473 m), weight 82 lb (37.195 kg). Patient appears comfortable sitting in a chair. Conjunctiva is pink. Sclera is nonicteric Oropharyngeal mucosa is normal. No neck masses or thyromegaly noted. Cardiac exam with regular rhythm normal S1 and S2. No murmur or gallop noted. Lungs  are clear to auscultation. Abdomen is flat and soft without tenderness splenomegaly or masses   No LE edema or clubbing noted.  Assessment: #1. 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 Her gallbladder however was not large or with thick wall or with stones on initial CT of June 2012. Now that she has had her gallbladder bladder removed, we can proceed with removal of stent and any residual stones that she might have. Unless she develops acute symptoms I would like to wait another 4-6 weeks. #2. Constipation.   Plan: Await results of blood work from Dr. Garner Nash office. ERCP with  stent removal in 4-6 weeks. Colace 2 tablets by mouth each bedtime. If Colace is ineffective she will start Peri-Colace one tablet by mouth twice a day.

## 2011-06-27 NOTE — Patient Instructions (Signed)
If colace does not help with constipation, can take Peri-colace one tablet by mouth twice daily. Physician will contact you when results of blood work received from Dr. Rosann Auerbach office.

## 2011-06-28 ENCOUNTER — Other Ambulatory Visit (INDEPENDENT_AMBULATORY_CARE_PROVIDER_SITE_OTHER): Payer: Self-pay | Admitting: *Deleted

## 2011-06-28 DIAGNOSIS — K831 Obstruction of bile duct: Secondary | ICD-10-CM

## 2011-06-30 DIAGNOSIS — J441 Chronic obstructive pulmonary disease with (acute) exacerbation: Secondary | ICD-10-CM | POA: Diagnosis not present

## 2011-06-30 DIAGNOSIS — E119 Type 2 diabetes mellitus without complications: Secondary | ICD-10-CM | POA: Diagnosis not present

## 2011-06-30 DIAGNOSIS — D869 Sarcoidosis, unspecified: Secondary | ICD-10-CM | POA: Diagnosis not present

## 2011-06-30 DIAGNOSIS — I5031 Acute diastolic (congestive) heart failure: Secondary | ICD-10-CM | POA: Diagnosis not present

## 2011-07-05 DIAGNOSIS — J441 Chronic obstructive pulmonary disease with (acute) exacerbation: Secondary | ICD-10-CM | POA: Diagnosis not present

## 2011-07-05 DIAGNOSIS — D869 Sarcoidosis, unspecified: Secondary | ICD-10-CM | POA: Diagnosis not present

## 2011-07-05 DIAGNOSIS — E119 Type 2 diabetes mellitus without complications: Secondary | ICD-10-CM | POA: Diagnosis not present

## 2011-07-05 DIAGNOSIS — I5031 Acute diastolic (congestive) heart failure: Secondary | ICD-10-CM | POA: Diagnosis not present

## 2011-07-08 DIAGNOSIS — I5031 Acute diastolic (congestive) heart failure: Secondary | ICD-10-CM | POA: Diagnosis not present

## 2011-07-08 DIAGNOSIS — E119 Type 2 diabetes mellitus without complications: Secondary | ICD-10-CM | POA: Diagnosis not present

## 2011-07-08 DIAGNOSIS — J441 Chronic obstructive pulmonary disease with (acute) exacerbation: Secondary | ICD-10-CM | POA: Diagnosis not present

## 2011-07-08 DIAGNOSIS — D869 Sarcoidosis, unspecified: Secondary | ICD-10-CM | POA: Diagnosis not present

## 2011-07-11 DIAGNOSIS — E119 Type 2 diabetes mellitus without complications: Secondary | ICD-10-CM | POA: Diagnosis not present

## 2011-07-11 DIAGNOSIS — D869 Sarcoidosis, unspecified: Secondary | ICD-10-CM | POA: Diagnosis not present

## 2011-07-11 DIAGNOSIS — J441 Chronic obstructive pulmonary disease with (acute) exacerbation: Secondary | ICD-10-CM | POA: Diagnosis not present

## 2011-07-11 DIAGNOSIS — I5031 Acute diastolic (congestive) heart failure: Secondary | ICD-10-CM | POA: Diagnosis not present

## 2011-07-12 DIAGNOSIS — J441 Chronic obstructive pulmonary disease with (acute) exacerbation: Secondary | ICD-10-CM | POA: Diagnosis not present

## 2011-07-12 DIAGNOSIS — I5031 Acute diastolic (congestive) heart failure: Secondary | ICD-10-CM | POA: Diagnosis not present

## 2011-07-12 DIAGNOSIS — D869 Sarcoidosis, unspecified: Secondary | ICD-10-CM | POA: Diagnosis not present

## 2011-07-12 DIAGNOSIS — E119 Type 2 diabetes mellitus without complications: Secondary | ICD-10-CM | POA: Diagnosis not present

## 2011-07-28 ENCOUNTER — Other Ambulatory Visit (HOSPITAL_COMMUNITY): Payer: Medicare Other

## 2011-08-02 DIAGNOSIS — R300 Dysuria: Secondary | ICD-10-CM | POA: Diagnosis not present

## 2011-08-02 DIAGNOSIS — E039 Hypothyroidism, unspecified: Secondary | ICD-10-CM | POA: Diagnosis not present

## 2011-08-02 DIAGNOSIS — R350 Frequency of micturition: Secondary | ICD-10-CM | POA: Diagnosis not present

## 2011-08-11 ENCOUNTER — Encounter (INDEPENDENT_AMBULATORY_CARE_PROVIDER_SITE_OTHER): Payer: Self-pay | Admitting: *Deleted

## 2011-08-18 ENCOUNTER — Telehealth (INDEPENDENT_AMBULATORY_CARE_PROVIDER_SITE_OTHER): Payer: Self-pay | Admitting: *Deleted

## 2011-08-18 NOTE — Telephone Encounter (Signed)
[facility] has requested a refill on pantoprazole 40 mg, take 1 tablet by mouth 30 min before breakfast

## 2011-08-19 MED ORDER — PANTOPRAZOLE SODIUM 40 MG PO TBEC: 40.0000 mg | DELAYED_RELEASE_TABLET | Freq: Every day | ORAL | Status: DC | PRN

## 2011-08-24 ENCOUNTER — Telehealth (INDEPENDENT_AMBULATORY_CARE_PROVIDER_SITE_OTHER): Payer: Self-pay | Admitting: *Deleted

## 2011-08-24 NOTE — Telephone Encounter (Signed)
Misty [name] is sch'd for ERCP 09/14/11, she asked Olegario Messier (daughter in law) to call and see if she can have an abdominal xray done on day of  prep op (5/8), Misty Heffern states "from time to time her right side feels like it knots up on the inside but you can't feel anything on the outside when this happens" --please advise

## 2011-08-24 NOTE — Telephone Encounter (Signed)
Okay to do KUB for stent position

## 2011-08-25 ENCOUNTER — Other Ambulatory Visit (INDEPENDENT_AMBULATORY_CARE_PROVIDER_SITE_OTHER): Payer: Self-pay | Admitting: *Deleted

## 2011-08-25 DIAGNOSIS — K831 Obstruction of bile duct: Secondary | ICD-10-CM

## 2011-08-25 NOTE — Telephone Encounter (Signed)
Order for KUB placed, Olegario Messier made aware

## 2011-09-06 ENCOUNTER — Encounter (HOSPITAL_COMMUNITY): Payer: Self-pay | Admitting: Pharmacy Technician

## 2011-09-07 ENCOUNTER — Ambulatory Visit (HOSPITAL_COMMUNITY)
Admission: RE | Admit: 2011-09-07 | Discharge: 2011-09-07 | Disposition: A | Discharge status: Home / Self Care | Payer: Medicare Other | Source: Ambulatory Visit | Attending: Internal Medicine | Admitting: Internal Medicine

## 2011-09-07 ENCOUNTER — Encounter (HOSPITAL_COMMUNITY)
Admission: RE | Admit: 2011-09-07 | Discharge: 2011-09-07 | Disposition: A | Discharge status: Home / Self Care | Payer: Medicare Other | Source: Ambulatory Visit | Attending: Internal Medicine | Admitting: Internal Medicine

## 2011-09-07 ENCOUNTER — Encounter (HOSPITAL_COMMUNITY): Payer: Self-pay

## 2011-09-07 VITALS — BP 159/97 | HR 98 | Temp 97.7°F | Resp 18 | Ht <= 58 in | Wt 81.0 lb

## 2011-09-07 DIAGNOSIS — K831 Obstruction of bile duct: Secondary | ICD-10-CM | POA: Insufficient documentation

## 2011-09-07 DIAGNOSIS — Z01812 Encounter for preprocedural laboratory examination: Secondary | ICD-10-CM | POA: Diagnosis not present

## 2011-09-07 DIAGNOSIS — K838 Other specified diseases of biliary tract: Secondary | ICD-10-CM | POA: Diagnosis not present

## 2011-09-07 DIAGNOSIS — K8050 Calculus of bile duct without cholangitis or cholecystitis without obstruction: Secondary | ICD-10-CM | POA: Diagnosis not present

## 2011-09-07 DIAGNOSIS — E119 Type 2 diabetes mellitus without complications: Secondary | ICD-10-CM | POA: Diagnosis not present

## 2011-09-07 HISTORY — DX: Heart failure, unspecified: I50.9

## 2011-09-07 LAB — BASIC METABOLIC PANEL
BUN: 22 mg/dL (ref 6–23)
CO2: 31 mEq/L (ref 19–32)
Chloride: 99 mEq/L (ref 96–112)
Creatinine, Ser: 0.62 mg/dL (ref 0.50–1.10)
Glucose, Bld: 185 mg/dL — ABNORMAL HIGH (ref 70–99)

## 2011-09-07 NOTE — Patient Instructions (Addendum)
20 Misty [name]  09/07/2011   Your procedure is scheduled on:   09/14/2011   Report to [facility] at  0700  AM.  Call this number if you have problems the morning of surgery: [phone]   Remember:   Do not eat food:After Midnight.  May have clear liquids:until Midnight .  Clear liquids include soda, tea, black coffee, apple or grape juice, broth.  Take these medicines the morning of surgery with A SIP OF WATER: levothyroxine,protonix,zofran   Do not wear jewelry, make-up or nail polish.  Do not wear lotions, powders, or perfumes. You may wear deodorant.  Do not shave 48 hours prior to surgery.  Do not bring valuables to the hospital.  Contacts, dentures or bridgework may not be worn into surgery.  Leave suitcase in the car. After surgery it may be brought to your room.  For patients admitted to the hospital, checkout time is 11:00 AM the day of discharge.   Patients discharged the day of surgery will not be allowed to drive home.  Name and phone number of your driver: family  Special Instructions: N/A   Please read over the following fact sheets that you were given: Pain Booklet, MRSA Information, Surgical Site Infection Prevention, Anesthesia Post-op Instructions and Care and Recovery After Surgery Endoscopic Retrograde Cholangiopancreatography This is a test used to evaluate people with jaundice (a condition in which the person's skin is turning yellow because of the high level of bilirubin in your blood). Bilirubin is a product of the blood which is elevated when an obstruction in the bile duct occurs. The bile ducts are the pipe-like system which carry the bile from the liver to the gallbladder and out into your small bowel. In this test, a fiber optic endoscope (a small pencil-sized telescope) is inserted and a catheter is put into ducts for examination. This test can reveal stones, strictures, cysts, tumors, and other irregularities within the pancreatic ducts and bile  ducts. PREPARATION FOR TEST Do not eat or drink after midnight the day before the test.  NORMAL FINDINGS Normal size of biliary and pancreatic ducts. No obstruction or filling defects within the biliary or pancreatic ducts. Ranges for normal findings may vary among different laboratories and hospitals. You should always check with your doctor after having lab work or other tests done to discuss the meaning of your test results and whether your values are considered within normal limits. MEANING OF TEST  Your caregiver will go over the test results with you and discuss the importance and meaning of your results, as well as treatment options and the need for additional tests if necessary. OBTAINING THE TEST RESULTS  It is your responsibility to obtain your test results. Ask the lab or department performing the test when and how you will get your results. Document Released: 08/12/2004 Document Revised: 12/29/2010 Document Reviewed: 03/28/2008 [facility] Patient Information 2012 [place], [place].PATIENT INSTRUCTIONS POST-ANESTHESIA  IMMEDIATELY FOLLOWING SURGERY:  Do not drive or operate machinery for the first twenty four hours after surgery.  Do not make any important decisions for twenty four hours after surgery or while taking narcotic pain medications or sedatives.  If you develop intractable nausea and vomiting or a severe headache please notify your doctor immediately.  FOLLOW-UP:  Please make an appointment with your surgeon as instructed. You do not need to follow up with anesthesia unless specifically instructed to do so.  WOUND CARE INSTRUCTIONS (if applicable):  Keep a dry clean dressing on the anesthesia/puncture wound  site if there is drainage.  Once the wound has quit draining you may leave it open to air.  Generally you should leave the bandage intact for twenty four hours unless there is drainage.  If the epidural site drains for more than 36-48 hours please call the anesthesia  department.  QUESTIONS?:  Please feel free to call your physician or the hospital operator if you have any questions, and they will be happy to assist you.     [facility] Anesthesia Department [address] [place] [phone]

## 2011-09-12 DIAGNOSIS — R300 Dysuria: Secondary | ICD-10-CM | POA: Diagnosis not present

## 2011-09-12 DIAGNOSIS — E039 Hypothyroidism, unspecified: Secondary | ICD-10-CM | POA: Diagnosis not present

## 2011-09-12 DIAGNOSIS — R350 Frequency of micturition: Secondary | ICD-10-CM | POA: Diagnosis not present

## 2011-09-14 ENCOUNTER — Ambulatory Visit (HOSPITAL_COMMUNITY): Payer: Medicare Other

## 2011-09-14 ENCOUNTER — Encounter (HOSPITAL_COMMUNITY): Payer: Self-pay | Admitting: *Deleted

## 2011-09-14 ENCOUNTER — Encounter (HOSPITAL_COMMUNITY)
Admission: RE | Disposition: A | Discharge status: Home / Self Care | Payer: Self-pay | Source: Ambulatory Visit | Attending: Internal Medicine

## 2011-09-14 ENCOUNTER — Ambulatory Visit (HOSPITAL_COMMUNITY)
Admission: RE | Admit: 2011-09-14 | Discharge: 2011-09-14 | Disposition: A | Discharge status: Home / Self Care | Payer: Medicare Other | Source: Ambulatory Visit | Attending: Internal Medicine | Admitting: Internal Medicine

## 2011-09-14 ENCOUNTER — Ambulatory Visit (HOSPITAL_COMMUNITY): Payer: Medicare Other | Admitting: Anesthesiology

## 2011-09-14 ENCOUNTER — Encounter (HOSPITAL_COMMUNITY): Payer: Self-pay | Admitting: Anesthesiology

## 2011-09-14 DIAGNOSIS — Z01812 Encounter for preprocedural laboratory examination: Secondary | ICD-10-CM | POA: Insufficient documentation

## 2011-09-14 DIAGNOSIS — E119 Type 2 diabetes mellitus without complications: Secondary | ICD-10-CM | POA: Insufficient documentation

## 2011-09-14 DIAGNOSIS — K8050 Calculus of bile duct without cholangitis or cholecystitis without obstruction: Secondary | ICD-10-CM | POA: Insufficient documentation

## 2011-09-14 DIAGNOSIS — K838 Other specified diseases of biliary tract: Secondary | ICD-10-CM | POA: Diagnosis not present

## 2011-09-14 DIAGNOSIS — K831 Obstruction of bile duct: Secondary | ICD-10-CM

## 2011-09-14 DIAGNOSIS — K8309 Other cholangitis: Secondary | ICD-10-CM | POA: Diagnosis not present

## 2011-09-14 HISTORY — PX: ERCP: SHX5425

## 2011-09-14 HISTORY — PX: SPYGLASS CHOLANGIOSCOPY: SHX5441

## 2011-09-14 HISTORY — PX: SPHINCTEROTOMY: SHX5544

## 2011-09-14 LAB — URINALYSIS, ROUTINE W REFLEX MICROSCOPIC
Glucose, UA: 100 mg/dL — AB
Specific Gravity, Urine: 1.015 (ref 1.005–1.030)
Urobilinogen, UA: 0.2 mg/dL (ref 0.0–1.0)
pH: 7.5 (ref 5.0–8.0)

## 2011-09-14 LAB — URINE MICROSCOPIC-ADD ON

## 2011-09-14 LAB — GLUCOSE, CAPILLARY: Glucose-Capillary: 127 mg/dL — ABNORMAL HIGH (ref 70–99)

## 2011-09-14 SURGERY — ERCP, WITH INTERVENTION IF INDICATED
Anesthesia: General

## 2011-09-14 MED ORDER — ONDANSETRON HCL 4 MG/2ML IJ SOLN
4.0000 mg | Freq: Once | INTRAMUSCULAR | Status: AC
  Administered 2011-09-14: 4 mg via INTRAVENOUS

## 2011-09-14 MED ORDER — ONDANSETRON HCL 4 MG/2ML IJ SOLN
INTRAMUSCULAR | Status: AC
  Administered 2011-09-14: 4 mg via INTRAVENOUS
  Filled 2011-09-14: qty 2

## 2011-09-14 MED ORDER — GLYCOPYRROLATE 0.2 MG/ML IJ SOLN
0.2000 mg | Freq: Once | INTRAMUSCULAR | Status: AC
  Administered 2011-09-14: 0.2 mg via INTRAVENOUS

## 2011-09-14 MED ORDER — SODIUM CHLORIDE 0.9 % IV SOLN
INTRAVENOUS | Status: DC | PRN
  Administered 2011-09-14: 09:00:00

## 2011-09-14 MED ORDER — FENTANYL CITRATE 0.05 MG/ML IJ SOLN
25.0000 ug | Freq: Once | INTRAMUSCULAR | Status: AC
  Administered 2011-09-14: 25 ug via INTRAVENOUS

## 2011-09-14 MED ORDER — PHENYLEPHRINE HCL 10 MG/ML IJ SOLN
INTRAMUSCULAR | Status: DC | PRN
  Administered 2011-09-14 (×2): 100 ug via INTRAVENOUS

## 2011-09-14 MED ORDER — ACETYLCYSTEINE 20 % IN SOLN
RESPIRATORY_TRACT | Status: AC
  Filled 2011-09-14: qty 4

## 2011-09-14 MED ORDER — CIPROFLOXACIN 400 MG/40ML IV SOLN
400.0000 mg | Freq: Once | INTRAVENOUS | Status: DC
  Filled 2011-09-14: qty 40

## 2011-09-14 MED ORDER — FENTANYL CITRATE 0.05 MG/ML IJ SOLN
INTRAMUSCULAR | Status: AC
  Administered 2011-09-14: 25 ug via INTRAVENOUS
  Filled 2011-09-14: qty 2

## 2011-09-14 MED ORDER — ROCURONIUM BROMIDE 100 MG/10ML IV SOLN
INTRAVENOUS | Status: DC | PRN
  Administered 2011-09-14: 30 mg via INTRAVENOUS

## 2011-09-14 MED ORDER — DEXAMETHASONE SODIUM PHOSPHATE 4 MG/ML IJ SOLN
4.0000 mg | Freq: Once | INTRAMUSCULAR | Status: AC
  Administered 2011-09-14: 4 mg via INTRAVENOUS

## 2011-09-14 MED ORDER — PHENYLEPHRINE HCL 10 MG/ML IJ SOLN
INTRAMUSCULAR | Status: AC
  Filled 2011-09-14: qty 1

## 2011-09-14 MED ORDER — ACETYLCYSTEINE 20 % IN SOLN
RESPIRATORY_TRACT | Status: DC | PRN
  Administered 2011-09-14: 4 mL via ORAL

## 2011-09-14 MED ORDER — LACTATED RINGERS IV SOLN
INTRAVENOUS | Status: DC | PRN
  Administered 2011-09-14 (×2): via INTRAVENOUS

## 2011-09-14 MED ORDER — FENTANYL CITRATE 0.05 MG/ML IJ SOLN: 25.0000 ug | INTRAMUSCULAR | Status: DC | PRN

## 2011-09-14 MED ORDER — GLUCAGON HCL (RDNA) 1 MG IJ SOLR
INTRAMUSCULAR | Status: DC | PRN
  Administered 2011-09-14: 0.25 mg via INTRAVENOUS

## 2011-09-14 MED ORDER — CEFAZOLIN SODIUM 1-5 GM-% IV SOLN
INTRAVENOUS | Status: DC | PRN
  Administered 2011-09-14: 1 g via INTRAVENOUS

## 2011-09-14 MED ORDER — DEXAMETHASONE SODIUM PHOSPHATE 4 MG/ML IJ SOLN
INTRAMUSCULAR | Status: AC
  Administered 2011-09-14: 4 mg via INTRAVENOUS
  Filled 2011-09-14: qty 1

## 2011-09-14 MED ORDER — GLYCOPYRROLATE 0.2 MG/ML IJ SOLN
INTRAMUSCULAR | Status: AC
  Administered 2011-09-14: 0.2 mg via INTRAVENOUS
  Filled 2011-09-14: qty 1

## 2011-09-14 MED ORDER — NEOSTIGMINE METHYLSULFATE 1 MG/ML IJ SOLN
INTRAMUSCULAR | Status: DC | PRN
  Administered 2011-09-14: 4 mg via INTRAVENOUS

## 2011-09-14 MED ORDER — MIDAZOLAM HCL 2 MG/2ML IJ SOLN
INTRAMUSCULAR | Status: AC
  Administered 2011-09-14: 2 mg via INTRAVENOUS
  Filled 2011-09-14: qty 2

## 2011-09-14 MED ORDER — LACTATED RINGERS IV SOLN
INTRAVENOUS | Status: DC
  Administered 2011-09-14: 1000 mL via INTRAVENOUS

## 2011-09-14 MED ORDER — ONDANSETRON HCL 4 MG/2ML IJ SOLN: 4.0000 mg | Freq: Once | INTRAMUSCULAR | Status: DC | PRN

## 2011-09-14 MED ORDER — CIPROFLOXACIN IN D5W 400 MG/200ML IV SOLN
INTRAVENOUS | Status: AC
  Filled 2011-09-14: qty 200

## 2011-09-14 MED ORDER — PROPOFOL 10 MG/ML IV EMUL
INTRAVENOUS | Status: AC
  Filled 2011-09-14: qty 20

## 2011-09-14 MED ORDER — FENTANYL CITRATE 0.05 MG/ML IJ SOLN
INTRAMUSCULAR | Status: DC | PRN
  Administered 2011-09-14: 100 ug via INTRAVENOUS

## 2011-09-14 MED ORDER — NEOSTIGMINE METHYLSULFATE 1 MG/ML IJ SOLN
INTRAMUSCULAR | Status: AC
  Filled 2011-09-14: qty 10

## 2011-09-14 MED ORDER — STERILE WATER FOR IRRIGATION IR SOLN
Status: DC | PRN
  Administered 2011-09-14: 09:00:00

## 2011-09-14 MED ORDER — LIDOCAINE HCL (CARDIAC) 10 MG/ML IV SOLN
INTRAVENOUS | Status: DC | PRN
  Administered 2011-09-14: 50 mg via INTRAVENOUS

## 2011-09-14 MED ORDER — LIDOCAINE HCL (PF) 1 % IJ SOLN
INTRAMUSCULAR | Status: AC
  Filled 2011-09-14: qty 5

## 2011-09-14 MED ORDER — PROPOFOL 10 MG/ML IV EMUL
INTRAVENOUS | Status: DC | PRN
  Administered 2011-09-14: 100 mg via INTRAVENOUS

## 2011-09-14 MED ORDER — EPHEDRINE SULFATE 50 MG/ML IJ SOLN
INTRAMUSCULAR | Status: DC | PRN
  Administered 2011-09-14: 15 mg via INTRAVENOUS

## 2011-09-14 MED ORDER — MIDAZOLAM HCL 2 MG/2ML IJ SOLN
1.0000 mg | INTRAMUSCULAR | Status: DC | PRN
  Administered 2011-09-14: 2 mg via INTRAVENOUS

## 2011-09-14 MED ORDER — ROCURONIUM BROMIDE 50 MG/5ML IV SOLN
INTRAVENOUS | Status: AC
  Filled 2011-09-14: qty 1

## 2011-09-14 MED ORDER — CEFAZOLIN SODIUM 1-5 GM-% IV SOLN
INTRAVENOUS | Status: AC
  Filled 2011-09-14: qty 50

## 2011-09-14 MED ORDER — CEFAZOLIN SODIUM 1-5 GM-% IV SOLN: 1.0000 g | INTRAVENOUS | Status: DC

## 2011-09-14 MED ORDER — EPHEDRINE SULFATE 50 MG/ML IJ SOLN
INTRAMUSCULAR | Status: AC
  Filled 2011-09-14: qty 1

## 2011-09-14 MED ORDER — GLYCOPYRROLATE 0.2 MG/ML IJ SOLN
INTRAMUSCULAR | Status: AC
  Filled 2011-09-14: qty 3

## 2011-09-14 MED ORDER — GLYCOPYRROLATE 0.2 MG/ML IJ SOLN
INTRAMUSCULAR | Status: DC | PRN
  Administered 2011-09-14: 0.6 mg via INTRAVENOUS

## 2011-09-14 SURGICAL SUPPLY — 29 items
BAG HAMPER (MISCELLANEOUS) ×2 IMPLANT
BALLN RETRIEVAL 12X15 (BALLOONS) ×2 IMPLANT
BASKET TRAPEZOID 3X6 (MISCELLANEOUS) ×2 IMPLANT
CATH ACCESS DELIVERY (CATHETERS) ×2 IMPLANT
DEVICE INFLATION ENCORE 26 (MISCELLANEOUS) IMPLANT
DEVICE LOCKING W-BIOPSY CAP (MISCELLANEOUS) ×2 IMPLANT
FORCEPS BIOP RJ4 240 HOT (CUTTING FORCEPS) IMPLANT
FORCEPS BIOP SPYBITE 1.2X286 (FORCEP) IMPLANT
GUIDEWIRE HYDRA JAGWIRE .35 (WIRE) IMPLANT
GUIDEWIRE JAG HINI 025X260CM (WIRE) IMPLANT
KIT ROOM TURNOVER APOR (KITS) ×2 IMPLANT
LUBRICANT JELLY 4.5OZ STERILE (MISCELLANEOUS) ×2 IMPLANT
NEEDLE HYPO 18GX1.5 BLUNT FILL (NEEDLE) ×2 IMPLANT
NS IRRIG 500ML POUR BTL (IV SOLUTION) ×2 IMPLANT
PAD ARMBOARD 7.5X6 YLW CONV (MISCELLANEOUS) ×2 IMPLANT
PATHFINDER 450CM 0.18 (STENTS) ×2 IMPLANT
POSITIONER HEAD 8X9X4 ADT (SOFTGOODS) IMPLANT
PROBE VISUALIZATION SPYGLASS (PROBE) ×2 IMPLANT
SNARE ROTATE MED OVAL 20MM (MISCELLANEOUS) ×2 IMPLANT
SPHINCTEROTOME AUTOTOME .25 (MISCELLANEOUS) IMPLANT
SPHINCTEROTOME HYDRATOME 44 (MISCELLANEOUS) ×2 IMPLANT
SPONGE GAUZE 4X4 12PLY (GAUZE/BANDAGES/DRESSINGS) ×2 IMPLANT
SYR 3ML LL SCALE MARK (SYRINGE) ×2 IMPLANT
SYR 50ML LL SCALE MARK (SYRINGE) ×2 IMPLANT
SYSTEM CONTINUOUS INJECTION (MISCELLANEOUS) ×2 IMPLANT
TUBE IRRIGATION (IRRIGATION / IRRIGATOR) ×2 IMPLANT
WALLSTENT METAL COVERED 10X60 (STENTS) IMPLANT
WALLSTENT METAL COVERED 10X80 (STENTS) IMPLANT
WATER STERILE IRR 1000ML POUR (IV SOLUTION) ×2 IMPLANT

## 2011-09-14 NOTE — Transfer of Care (Signed)
Immediate Anesthesia Transfer of Care Note  Patient: Misty [name]  Procedure(s) Performed: Procedure(s) (LRB): ENDOSCOPIC RETROGRADE CHOLANGIOPANCREATOGRAPHY (ERCP) (N/A) SPYGLASS CHOLANGIOSCOPY (N/A) SPHINCTEROTOMY (N/A)  Patient Location: PACU  Anesthesia Type: General  Level of Consciousness: awake, alert , oriented and patient cooperative  Airway & Oxygen Therapy: Patient Spontanous Breathing and Patient connected to face mask oxygen  Post-op Assessment: Report given to PACU RN and Post -op Vital signs reviewed and stable  Post vital signs: Reviewed and stable  Complications: No apparent anesthesia complications

## 2011-09-14 NOTE — Anesthesia Postprocedure Evaluation (Addendum)
  Anesthesia Post-op Note  Patient: Misty [name]  Procedure(s) Performed: Procedure(s) (LRB): ENDOSCOPIC RETROGRADE CHOLANGIOPANCREATOGRAPHY (ERCP) (N/A) SPYGLASS CHOLANGIOSCOPY (N/A) SPHINCTEROTOMY (N/A)  Patient Location: PACU  Anesthesia Type: General  Level of Consciousness: awake, alert , oriented and patient cooperative  Airway and Oxygen Therapy: Patient Spontanous Breathing and Patient connected to face mask oxygen  Post-op Pain: mild  Post-op Assessment: Post-op Vital signs reviewed, Patient's Cardiovascular Status Stable, Respiratory Function Stable and Patent Airway  Post-op Vital Signs: Reviewed and stable  Complications: No apparent anesthesia complications Patient discharged yesterday.

## 2011-09-14 NOTE — Anesthesia Procedure Notes (Signed)
Procedure Name: Intubation Date/Time: 09/14/2011 8:44 AM Performed by: Carolyne Littles, Treyton Slimp L Pre-anesthesia Checklist: Patient identified, Patient being monitored, Timeout performed, Emergency Drugs available and Suction available Patient Re-evaluated:Patient Re-evaluated prior to inductionOxygen Delivery Method: Circle System Utilized Preoxygenation: Pre-oxygenation with 100% oxygen Intubation Type: IV induction Ventilation: Mask ventilation without difficulty Laryngoscope Size: 3 and Miller Grade View: Grade I Tube type: Oral Tube size: 7.0 mm Number of attempts: 1 Airway Equipment and Method: stylet Placement Confirmation: ETT inserted through vocal cords under direct vision,  positive ETCO2 and breath sounds checked- equal and bilateral Secured at: 21 cm Tube secured with: Tape Dental Injury: Teeth and Oropharynx as per pre-operative assessment

## 2011-09-14 NOTE — Progress Notes (Signed)
Dr Karilyn Cota in to see pt. Ua specimen obtained and sent to lab. Denies further nausea or pain.

## 2011-09-14 NOTE — Discharge Instructions (Signed)
Endoscopic Retrograde Cholangiopancreatography (ERCP) ERCP stands for endoscopic retrograde cholangiopancreatography. In this procedure, a thin, lighted tube (endoscope) is used. It is passed through the mouth and down the back of the throat into the upper part of the intestine, called the duodenum. A small, plastic tube (cannula) is then passed through the endoscope. It is directed into the bile duct or pancreatic duct. Dye is then injected through the tube. X-rays are taken to study the biliary and pancreatic passageways. This procedure is used to diagnosis many diseases of the pancreas, bile ducts, liver, and gallbladder. LET YOUR CAREGIVER KNOW ABOUT:   Allergies to food or medicine.   Medicines taken, including vitamins, herbs, eyedrops, over-the-counter medicines, and creams.   Use of steroids (by mouth or creams).   Previous problems with anesthetics or numbing medicines.   History of bleeding problems or blood clots.   Previous surgery.   Other health problems, including diabetes and kidney problems.   Possibility of pregnancy, if this applies.   Any barium X-rays during the past week.  BEFORE THE PROCEDURE   Do not eat or drink anything, including water, for at least 6 hours before the procedure.   Ask your caregiver whether you should stop taking certain medicines prior to your procedure.   Arrange for someone to drive you home. You will not be allowed to drive for several hours after the procedure.   Arrive at least 60 minutes before the procedure or as directed. This will give you time to check in and fill out any necessary paperwork.  PROCEDURE   You will be given medicine through a vein (intravenously) to make you relaxed and sleepy.   You might have a breathing tube placed to give you medicine that makes you sleep (general anesthetic).   Your throat may be sprayed with medicine that numbs the area (local anesthetic).   You will lie on your left side. The endoscope  will be inserted through your mouth and into the duodenum. The tube will not interfere with your breathing. Gagging is prevented by the anesthesia.   While X-rays are being taken, you may be positioned on your stomach.  During the ERCP, the person performing the procedure may identify a blockage or narrowed opening to the bile duct. A muscular portion of the main bile duct may be partially cut (sphincterotomy). A thin, plastic tube (stent) will be positioned inside your bile duct. This is to allow fluid secreted by the liver (bile) to flow more easily through the narrowed opening. AFTER THE PROCEDURE   You will rest in bed until you are fully conscious.   When you first wake up, your throat may feel slightly sore.   You will not be allowed to eat or drink until numbness subsides.   Once you are able to drink, urinate, and sit on the edge of the bed without feeling sick to your stomach (nauseous) or dizzy, you may be allowed to go home.   Have a friend or family member with you for the first 24 hours after your procedure.  SEEK MEDICAL CARE IF:   You have an oral temperature above 102 F (38.9 C).   You develop other signs of infection, including chills or feeling unwell.   You have abdominal pain.   You have questions or concerns.  MAKE SURE YOU:   Understand these instructions.   Will watch your condition.   Will get help right away if you are not doing well or get worse.  Document Released: 01/11/2001 Document Revised: 04/07/2011 Document Reviewed: 08/04/2009 [facility] Patient Information 2012 [place], [place].

## 2011-09-14 NOTE — Op Note (Signed)
Misty [name], [place]                ACCOUNT NO.:  [id number]  MEDICAL RECORD NO.:  [id number]  LOCATION:  [facility]                          FACILITY:  [facility]  PHYSICIAN:  Lionel December, M.D.    DATE OF BIRTH:  July 21, 1929  DATE OF PROCEDURE: DATE OF DISCHARGE:  09/14/2011                              OPERATIVE REPORT   PROCEDURE:  ERCP with removal of biliary stent, extension of sphincterotomy and SpyGlass examination of bile duct.  INDICATION:  Ms. 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  She has undergone 3 ERCPs previously and drainage could never be established.  She therefore has biliary stent in place.  She had her cholecystectomy in December 2012.  She is undergoing therapeutic procedure.  Procedure risks were reviewed with the patient and her son and informed consent was obtained.  MEDS FOR SEDATION/ANESTHESIA:  Please see anesthesia records for details.  FINDINGS:  Procedure performed in the OR.  Once the patient was placed under anesthesia, intubated, turned into semiprone position.  Side- viewing Pentax video duodenoscope was passed via oropharynx without any difficulty into esophagus and stomach across the pylorus into bulb and second part of the duodenum with stent in place.  The stent was caught with snare and removed under fluoroscopic control.  Endoscope was passed again and CBD cannulated with Rx 44 Autotome with 0.35 Hydra Jagwire. Dilated common bile duct with very large common hepatic duct with sigmoid shape.  Few filling defects noted, felt to be debris and/or small stones.  Intrahepatic biliary radicals were also dilated. SpyGlass examination performed.  Multiple stone fragments were flushed out with irrigation.  No stricture or tumor noted.  SpyGlass was removed.  Dormia basket was passed through the bile duct in open position once.  Subsequently, trolled common bile duct  with stone.  A 10- 11 mm balloon was passed across the sphincterotomy without any difficulty.  Endoscope was withdrawn.  The patient tolerated the procedure well and is in the process of being extubated.  FINAL DIAGNOSES: 1. Biliary stent removed. 2. Biliary sphincterotomy extended.  SpyGlass examination performed.     No tumor and no stricture noted to bile duct. 3. Multiple stones/stone fragments were flushed out with irrigation.  RECOMMENDATIONS: 1. Clear liquids today. 2. The patient will continue Urso. 3. She will return for office visit in 8 weeks.  She had LFTs prior to     that visit.          ______________________________ Lionel December, M.D.     NR/MEDQ  D:  09/14/2011  T:  09/14/2011  Job:  [id number]  cc:   Donzetta Sprung, MD Fax: [phone] Addendum; Patient was complaining of dysuria; I received a call that urine culture was negative. Urinalysis was repeated and she had too many white cells. Therefore lab was requested to repeat urine culture. Patient will continue Augmentin.

## 2011-09-14 NOTE — H&P (Signed)
Misty [name] is an 78 y.o. female.   Chief Complaint: Patient is here for ERCP. HPI: Patient is 78 year old Caucasian female who presented in June 2012 at obstructive jaundice and markedly dilated there is system. On ERCP she was felt to have a restenosis with poor drainage following sphincterotomy. She was then stented. On 2 subsequent ERCPs one in October and the second on December 2012 she is stones removed however duct still would not drain. She was finally diagnosed with Mirizzi syndrome and had gallbladder removed after the Christmas holidays. She has not experienced any more episodes of abdominal pain nausea or vomiting. According to her son her appetite is back to normal. She's never been a big eater. She is slowly gaining weight. She was begun on Augmentin 2 days ago for urinary tract infection and she is already feeling better. This morning she had minimal dysuria.  Past Medical History  Diagnosis Date  . Anorexia   . Weight loss   . Jaundice   . Biliary obstruction   . Hypothyroidism   . Diabetes mellitus   . GERD (gastroesophageal reflux disease)   . Sarcoidosis   . Congestive heart failure December, 2012    [facility]    Past Surgical History  Procedure Date  . Vagina surgery as a teen  . Ercp 02/16/2011    Procedure: ENDOSCOPIC RETROGRADE CHOLANGIOPANCREATOGRAPHY (ERCP);  Surgeon: Malissa Hippo, MD;  Location: [facility];  Service: Endoscopy;  Laterality: N/A;  7:30/with Stent Removal  . Biliary stent placement 02/16/2011    Procedure: BILIARY STENT PLACEMENT;  Surgeon: Malissa Hippo, MD;  Location: [facility];  Service: Endoscopy;  Laterality: N/A;  . Sphincterotomy 02/16/2011    Procedure: SPHINCTEROTOMY;  Surgeon: Malissa Hippo, MD;  Location: [facility];  Service: Endoscopy;  Laterality: N/A;  . Ercp 10/08/2010  . Appendectomy   . Ercp 04/20/2011    Procedure: ENDOSCOPIC RETROGRADE CHOLANGIOPANCREATOGRAPHY (ERCP);  Surgeon: Malissa Hippo, MD;   Location: [facility];  Service: Endoscopy;  Laterality: N/A;  removal of stent, removal of multiple stones with basket  . Spyglass cholangioscopy 04/20/2011    Procedure: SPYGLASS CHOLANGIOSCOPY;  Surgeon: Malissa Hippo, MD;  Location: [facility];  Service: Endoscopy;  Laterality: N/A;  . Biliary stent placement 04/20/2011    Procedure: BILIARY STENT PLACEMENT;  Surgeon: Malissa Hippo, MD;  Location: [facility];  Service: Endoscopy;  Laterality: N/A;  . Cholecystectomy 04/27/2011    Procedure: LAPAROSCOPIC CHOLECYSTECTOMY WITH INTRAOPERATIVE CHOLANGIOGRAM;  Surgeon: Fabio Bering, MD;  Location: [facility];  Service: General;  Laterality: N/A;    Family History  Problem Relation Age of Onset  . Anesthesia problems Neg Hx   . Hypotension Neg Hx   . Malignant hyperthermia Neg Hx   . Pseudochol deficiency Neg Hx   . Scoliosis Son   . Hypertension Son   . Sinusitis Son   . Healthy Son    Social History:  reports that she has never smoked. She has never used smokeless tobacco. She reports that she does not drink alcohol or use illicit drugs.  Allergies:  Allergies  Allergen Reactions  . Nitrofuran Derivatives Hives  . Sulfa Antibiotics Hives and Swelling    Medications Prior to Admission  Medication Sig Dispense Refill  . amoxicillin-clavulanate (AUGMENTIN) 875-125 MG per tablet Take 1 tablet by mouth 2 (two) times daily.      . Chromium 200 MCG CAPS Take 1 capsule by mouth every morning.      Marland Kitchen  Cranberry 1000 MG CAPS Take 1 capsule by mouth 2 (two) times daily.      . diclofenac sodium (VOLTAREN) 1 % GEL Apply 1 application topically 4 (four) times daily. For arthritis pain      . docusate sodium (COLACE) 100 MG capsule Take 2 capsules (200 mg total) by mouth at bedtime.  60 capsule  5  . glipiZIDE (GLUCOTROL XL) 10 MG 24 hr tablet Take 10 mg by mouth every morning.      Marland Kitchen levothyroxine (SYNTHROID, LEVOTHROID) 137 MCG tablet Take 137 mcg by mouth daily.      . mirtazapine (REMERON) 15 MG  tablet Take 15 mg by mouth at bedtime.      . pantoprazole (PROTONIX) 40 MG tablet Take 1 tablet (40 mg total) by mouth daily as needed. For acid reflux  30 tablet  5  . traMADol (ULTRAM) 50 MG tablet Take 50 mg by mouth 3 (three) times daily as needed.       . ursodiol (ACTIGALL) 300 MG capsule Take 300 mg by mouth 2 (two) times daily.          Results for orders placed during the hospital encounter of 09/14/11 (from the past 48 hour(s))  GLUCOSE, CAPILLARY     Status: Abnormal   Collection Time   09/14/11  7:21 AM      Component Value Range Comment   Glucose-Capillary 127 (*) 70 - 99 (mg/dL)    No results found.  Review of Systems  Constitutional: Negative for fever and chills.  Respiratory: Negative for shortness of breath.   Cardiovascular: Negative for chest pain.    Blood pressure 129/82, pulse 90, temperature 98.5 F (36.9 C), temperature source Oral, resp. rate 22, SpO2 96.00%. Physical Exam  Constitutional:       Pleasant thin Caucasian female in NAD  HENT:  Mouth/Throat: Oropharynx is clear and moist.  Eyes: Conjunctivae are normal. No scleral icterus.  Neck: No thyromegaly present.  Cardiovascular: Normal rate, regular rhythm and normal heart sounds.   No murmur heard. Respiratory: Effort normal and breath sounds normal.       pectum carinatum  GI: Soft. She exhibits no distension. There is no tenderness.  Musculoskeletal: She exhibits no edema.  Lymphadenopathy:    She has no cervical adenopathy.  Neurological: She is alert.  Skin: Skin is warm and dry.     Assessment/Plan History of obstructive jaundice most likely secondary to Mirizzi syndrome. Now that her gallbladder has been removed and biliary obstruction alleviated she may not need stenting anymore. She will undergo ERCP with removal of stent  and Spyglass examination. Procedure discussed with patient and [name] in detail and they're both agreeable.   Stiven Kaspar U 09/14/2011, 8:06 AM

## 2011-09-14 NOTE — Preoperative (Signed)
Beta Blockers   Reason not to administer Beta Blockers:Not Applicable 

## 2011-09-14 NOTE — Anesthesia Preprocedure Evaluation (Signed)
Anesthesia Evaluation  Patient identified by MRN, date of birth, ID band Patient awake    Reviewed: Allergy & Precautions, H&P , NPO status , Patient's Chart, lab work & pertinent test results  History of Anesthesia Complications Negative for: history of anesthetic complications  Airway Mallampati: I      Dental  (+) Edentulous Upper and Edentulous Lower   Pulmonary COPD (hx sarcoidosis)   Pulmonary exam normal       Cardiovascular negative cardio ROS  Rhythm:Regular Rate:Normal     Neuro/Psych    GI/Hepatic   Endo/Other  Diabetes mellitus-, Well Controlled, Type 2, Oral Hypoglycemic AgentsHypothyroidism   Renal/GU      Musculoskeletal   Abdominal   Peds  Hematology   Anesthesia Other Findings   Reproductive/Obstetrics                           Anesthesia Physical Anesthesia Plan  ASA: III  Anesthesia Plan: General   Post-op Pain Management:    Induction: Intravenous, Rapid sequence and Cricoid pressure planned  Airway Management Planned: Oral ETT  Additional Equipment:   Intra-op Plan:   Post-operative Plan: Extubation in OR  Informed Consent: I have reviewed the patients History and Physical, chart, labs and discussed the procedure including the risks, benefits and alternatives for the proposed anesthesia with the patient or authorized representative who has indicated his/her understanding and acceptance.     Plan Discussed with:   Anesthesia Plan Comments:         Anesthesia Quick Evaluation

## 2011-09-14 NOTE — Brief Op Note (Signed)
09/14/2011  10:01 AM  PATIENT:  Minna Antis  78 y.o. female  PRE-OPERATIVE DIAGNOSIS:  biliary obstructions  POST-OPERATIVE DIAGNOSIS:  biliary obstructions  PROCEDURE:  Procedure(s) (LRB): ENDOSCOPIC RETROGRADE CHOLANGIOPANCREATOGRAPHY (ERCP) (N/A) SPYGLASS CHOLANGIOSCOPY (N/A)  SURGEON:  Surgeon(s) and Role:    * Malissa Hippo, MD - Primary  ERCP note. Ampulla located along the left wall of duodenal diverticula. Biliary stent removed. Limited biliary system was dilated in the region of common hepatic duct. Sphincterotomy extended for another 3-4 cm.  Spyglass examination performed. Multiple stone fragments flushed out with irrigation. Able to pass 10-11 mm balloon across sphincterotomy without any difficulty. Patient  tolerated the procedure well

## 2011-09-15 LAB — URINE CULTURE: Culture  Setup Time: 201305151750

## 2011-09-15 NOTE — Addendum Note (Signed)
Addendum  created 09/15/11 1610 by Moshe Salisbury, CRNA   Modules edited:Notes Section

## 2011-09-16 ENCOUNTER — Encounter (HOSPITAL_COMMUNITY): Payer: Self-pay | Admitting: Internal Medicine

## 2011-09-19 ENCOUNTER — Telehealth (INDEPENDENT_AMBULATORY_CARE_PROVIDER_SITE_OTHER): Payer: Self-pay | Admitting: *Deleted

## 2011-09-19 DIAGNOSIS — K831 Obstruction of bile duct: Secondary | ICD-10-CM

## 2011-09-19 NOTE — Telephone Encounter (Signed)
Patient to have labs in July with a Office Visit following the lab work. Lab is noted for July 15 th.

## 2011-10-07 DIAGNOSIS — E039 Hypothyroidism, unspecified: Secondary | ICD-10-CM | POA: Diagnosis not present

## 2011-10-07 DIAGNOSIS — E782 Mixed hyperlipidemia: Secondary | ICD-10-CM | POA: Diagnosis not present

## 2011-10-13 DIAGNOSIS — R5381 Other malaise: Secondary | ICD-10-CM | POA: Diagnosis not present

## 2011-10-13 DIAGNOSIS — E039 Hypothyroidism, unspecified: Secondary | ICD-10-CM | POA: Diagnosis not present

## 2011-10-13 DIAGNOSIS — R5383 Other fatigue: Secondary | ICD-10-CM | POA: Diagnosis not present

## 2011-10-13 DIAGNOSIS — E782 Mixed hyperlipidemia: Secondary | ICD-10-CM | POA: Diagnosis not present

## 2011-10-13 DIAGNOSIS — M810 Age-related osteoporosis without current pathological fracture: Secondary | ICD-10-CM | POA: Diagnosis not present

## 2011-10-13 DIAGNOSIS — L988 Other specified disorders of the skin and subcutaneous tissue: Secondary | ICD-10-CM | POA: Diagnosis not present

## 2011-10-27 ENCOUNTER — Encounter (INDEPENDENT_AMBULATORY_CARE_PROVIDER_SITE_OTHER): Payer: Self-pay | Admitting: *Deleted

## 2011-10-27 ENCOUNTER — Other Ambulatory Visit (INDEPENDENT_AMBULATORY_CARE_PROVIDER_SITE_OTHER): Payer: Self-pay | Admitting: *Deleted

## 2011-10-27 DIAGNOSIS — K831 Obstruction of bile duct: Secondary | ICD-10-CM | POA: Diagnosis not present

## 2011-11-14 DIAGNOSIS — K831 Obstruction of bile duct: Secondary | ICD-10-CM | POA: Diagnosis not present

## 2011-11-15 ENCOUNTER — Ambulatory Visit (INDEPENDENT_AMBULATORY_CARE_PROVIDER_SITE_OTHER): Payer: Medicare Other | Admitting: Internal Medicine

## 2011-11-15 ENCOUNTER — Encounter (INDEPENDENT_AMBULATORY_CARE_PROVIDER_SITE_OTHER): Payer: Self-pay | Admitting: Internal Medicine

## 2011-11-15 VITALS — BP 160/80 | HR 72 | Temp 96.8°F | Ht <= 58 in | Wt 76.2 lb

## 2011-11-15 DIAGNOSIS — K8050 Calculus of bile duct without cholangitis or cholecystitis without obstruction: Secondary | ICD-10-CM | POA: Diagnosis not present

## 2011-11-15 LAB — HEPATIC FUNCTION PANEL
AST: 19 U/L (ref 0–37)
Albumin: 3.8 g/dL (ref 3.5–5.2)
Alkaline Phosphatase: 73 U/L (ref 39–117)
Total Bilirubin: 0.5 mg/dL (ref 0.3–1.2)

## 2011-11-15 MED ORDER — URSODIOL 300 MG PO CAPS: 300.0000 mg | ORAL_CAPSULE | Freq: Every day | ORAL | Status: DC

## 2011-11-15 MED ORDER — URSODIOL 300 MG PO CAPS: 300.0000 mg | ORAL_CAPSULE | Freq: Two times a day (BID) | ORAL | Status: DC

## 2011-11-15 NOTE — Patient Instructions (Addendum)
Decrease Urso to one capsule by mouth daily.

## 2011-11-21 NOTE — Progress Notes (Signed)
Presenting complaint;  Follow for bile duct obstruction.  Subjective:  Patient is 78 year old Caucasian female who presented last year with obstructive jaundice. She initially was presumed to have papillary stenosis and underwent sphincterotomy and biliary stenting for very dilated system. On subsequent exam she was found to have stones and also had Mirrizzi syndrome. She had cholecystectomy in December 2012 with slow recovery due to the respiratory problem. She had her fourth ERCP in May 2013 with removal of biliary stent, extension of sphincterotomy and removal of debris and stones from the bile duct. She is accompanied by [name]. She has not experienced any more episodes of abdominal pain nausea vomiting fever or chills. She is eating much better than she has been hasn't gained any weight. She denies shortness of breath. She states she is able to move around much better. She is using cane.  Current Medications: Current Outpatient Prescriptions  Medication Sig Dispense Refill  . Cranberry 1000 MG CAPS Take 1 capsule by mouth 2 (two) times daily.      . diclofenac sodium (VOLTAREN) 1 % GEL Apply 1 application topically 4 (four) times daily. For arthritis pain      . docusate sodium (COLACE) 100 MG capsule Take 2 capsules (200 mg total) by mouth at bedtime.  60 capsule  5  . glipiZIDE (GLUCOTROL XL) 10 MG 24 hr tablet Take 10 mg by mouth every morning.      Marland Kitchen levothyroxine (SYNTHROID, LEVOTHROID) 137 MCG tablet Take 137 mcg by mouth daily.      . mirtazapine (REMERON) 15 MG tablet Take 15 mg by mouth at bedtime.      . pantoprazole (PROTONIX) 40 MG tablet Take 1 tablet (40 mg total) by mouth daily as needed. For acid reflux  30 tablet  5  . traMADol (ULTRAM) 50 MG tablet Take 50 mg by mouth 3 (three) times daily as needed.       . ursodiol (ACTIGALL) 300 MG capsule Take 1 capsule (300 mg total) by mouth daily.  30 capsule  11  . amoxicillin-clavulanate (AUGMENTIN) 875-125 MG per tablet  Take 1 tablet by mouth 2 (two) times daily.      . Chromium 200 MCG CAPS Take 1 capsule by mouth every morning.         Objective: Blood pressure 160/80, pulse 72, temperature 96.8 F (36 C), height 4' 0.5" (1.232 m), weight 76 lb 3.2 oz (34.564 kg). Patient is alert and in no acute distress. Conjunctiva is pink. Sclera is nonicteric Oropharyngeal mucosa is normal. No neck masses or thyromegaly noted. Abdomen is flat soft and nontender without organomegaly or masses. No LE edema or clubbing noted.  Labs/studies Results: From 11/14/2011. Total bilirubin is oh 0.5, AP 73, AST 19, ALT 9, total protein 7.0 and albumin is 3.8.   Assessment:  Biliary obstruction secondary to papillary stenosis and Mirizzi syndrome. She had cholecystectomy in December 2012. Since her last ERCP she has experienced no GI symptoms. Her LFTs are normal. Since she has markedly dilated biliary system and at risk for stasis and recurrent stone she should continue Urso at  reduced dose.   Plan:  Decrease Urso to 300 mg by mouth daily. Office visit in one year.

## 2011-12-15 DIAGNOSIS — IMO0001 Reserved for inherently not codable concepts without codable children: Secondary | ICD-10-CM | POA: Diagnosis not present

## 2011-12-15 DIAGNOSIS — Z9181 History of falling: Secondary | ICD-10-CM | POA: Diagnosis not present

## 2011-12-15 DIAGNOSIS — J449 Chronic obstructive pulmonary disease, unspecified: Secondary | ICD-10-CM | POA: Diagnosis not present

## 2011-12-15 DIAGNOSIS — M6281 Muscle weakness (generalized): Secondary | ICD-10-CM | POA: Diagnosis not present

## 2011-12-15 DIAGNOSIS — R269 Unspecified abnormalities of gait and mobility: Secondary | ICD-10-CM | POA: Diagnosis not present

## 2011-12-23 DIAGNOSIS — J449 Chronic obstructive pulmonary disease, unspecified: Secondary | ICD-10-CM | POA: Diagnosis not present

## 2011-12-23 DIAGNOSIS — Z9181 History of falling: Secondary | ICD-10-CM | POA: Diagnosis not present

## 2011-12-23 DIAGNOSIS — M6281 Muscle weakness (generalized): Secondary | ICD-10-CM | POA: Diagnosis not present

## 2011-12-23 DIAGNOSIS — IMO0001 Reserved for inherently not codable concepts without codable children: Secondary | ICD-10-CM | POA: Diagnosis not present

## 2011-12-23 DIAGNOSIS — R269 Unspecified abnormalities of gait and mobility: Secondary | ICD-10-CM | POA: Diagnosis not present

## 2011-12-30 DIAGNOSIS — M6281 Muscle weakness (generalized): Secondary | ICD-10-CM | POA: Diagnosis not present

## 2011-12-30 DIAGNOSIS — IMO0001 Reserved for inherently not codable concepts without codable children: Secondary | ICD-10-CM | POA: Diagnosis not present

## 2011-12-30 DIAGNOSIS — Z9181 History of falling: Secondary | ICD-10-CM | POA: Diagnosis not present

## 2011-12-30 DIAGNOSIS — J449 Chronic obstructive pulmonary disease, unspecified: Secondary | ICD-10-CM | POA: Diagnosis not present

## 2011-12-30 DIAGNOSIS — R269 Unspecified abnormalities of gait and mobility: Secondary | ICD-10-CM | POA: Diagnosis not present

## 2012-01-17 DIAGNOSIS — R5381 Other malaise: Secondary | ICD-10-CM | POA: Diagnosis not present

## 2012-01-17 DIAGNOSIS — E039 Hypothyroidism, unspecified: Secondary | ICD-10-CM | POA: Diagnosis not present

## 2012-01-17 DIAGNOSIS — IMO0001 Reserved for inherently not codable concepts without codable children: Secondary | ICD-10-CM | POA: Diagnosis not present

## 2012-01-17 DIAGNOSIS — E782 Mixed hyperlipidemia: Secondary | ICD-10-CM | POA: Diagnosis not present

## 2012-01-24 DIAGNOSIS — R5381 Other malaise: Secondary | ICD-10-CM | POA: Diagnosis not present

## 2012-01-24 DIAGNOSIS — E119 Type 2 diabetes mellitus without complications: Secondary | ICD-10-CM | POA: Diagnosis not present

## 2012-01-24 DIAGNOSIS — L988 Other specified disorders of the skin and subcutaneous tissue: Secondary | ICD-10-CM | POA: Diagnosis not present

## 2012-01-24 DIAGNOSIS — E782 Mixed hyperlipidemia: Secondary | ICD-10-CM | POA: Diagnosis not present

## 2012-01-24 DIAGNOSIS — M810 Age-related osteoporosis without current pathological fracture: Secondary | ICD-10-CM | POA: Diagnosis not present

## 2012-01-24 DIAGNOSIS — E039 Hypothyroidism, unspecified: Secondary | ICD-10-CM | POA: Diagnosis not present

## 2012-01-24 DIAGNOSIS — R5383 Other fatigue: Secondary | ICD-10-CM | POA: Diagnosis not present

## 2012-01-26 IMAGING — CR DG CHEST 1V PORT
1 series · 1 of 1 positions shown · non-contrast
Comparison: Chest radiograph performed 04/27/2011

CLINICAL DATA: Follow up ventilated patient.

PORTABLE CHEST - 1 VIEW

[view not recorded]
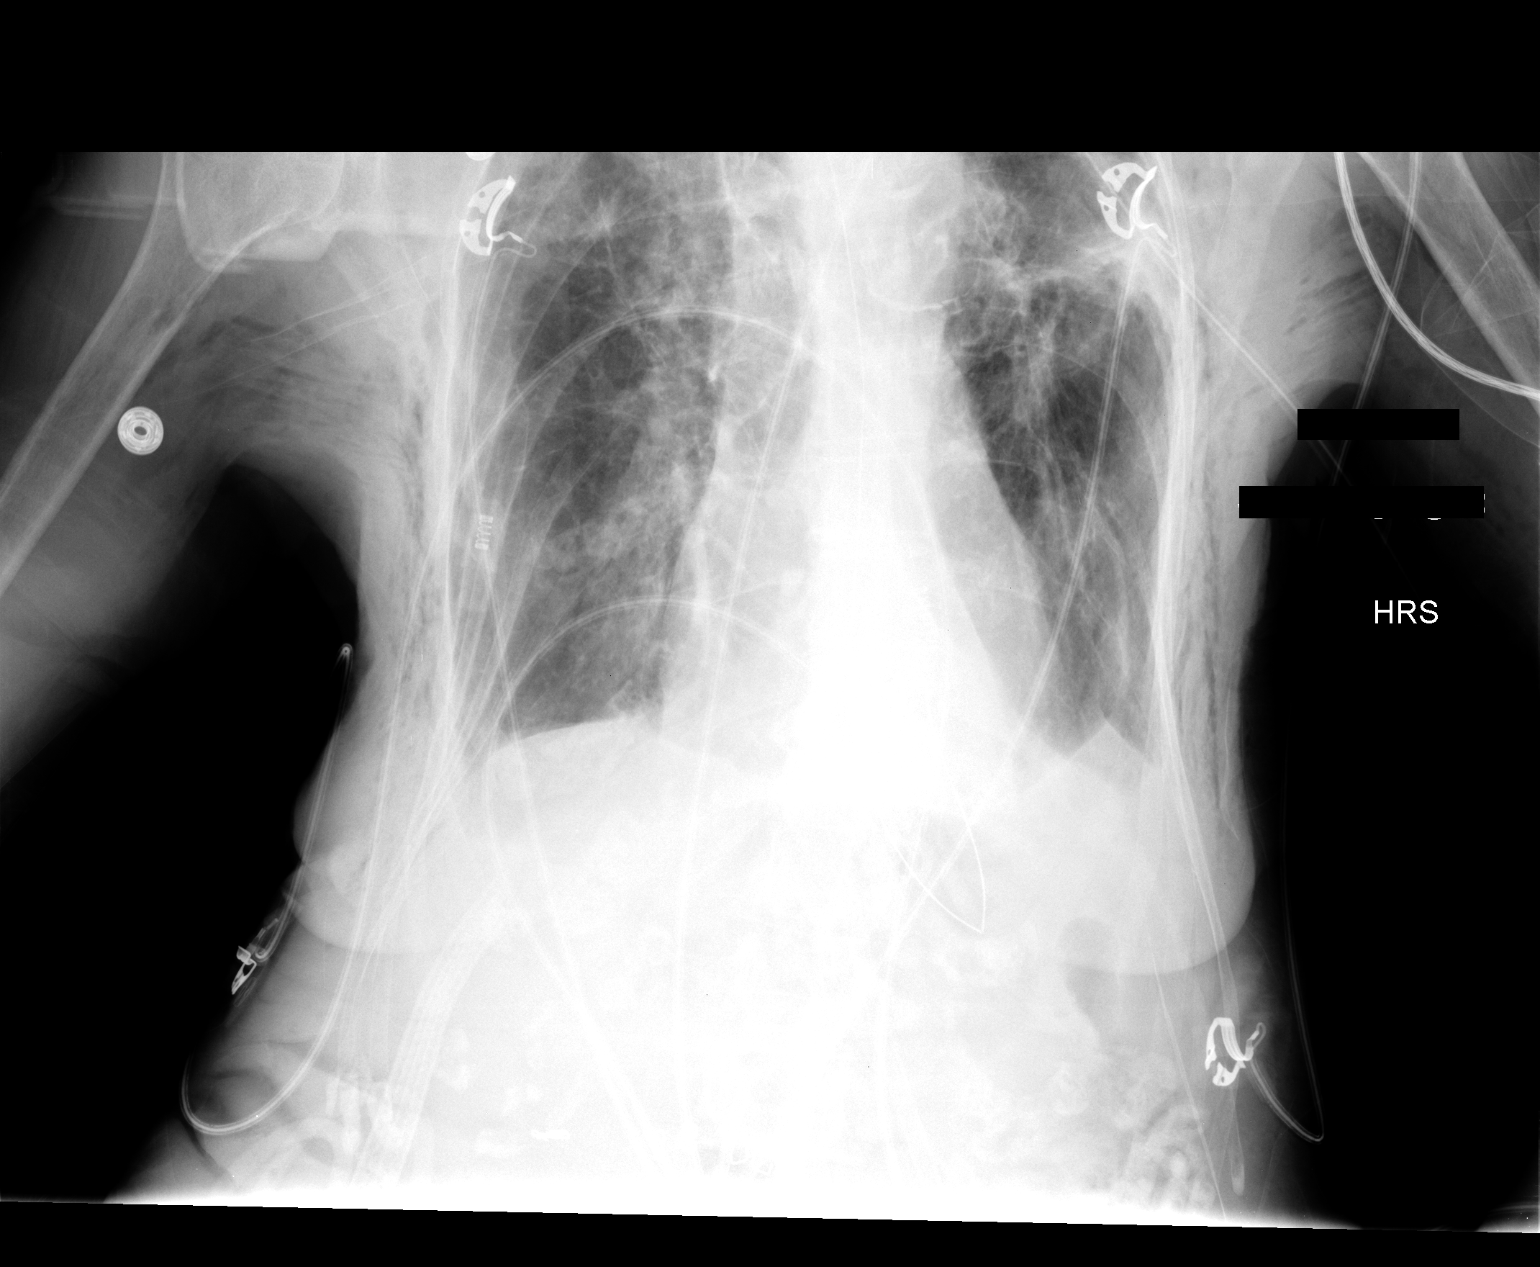

[1 of 1 positions shown; findings below may reference images not displayed]

FINDINGS: The patient's endotracheal tube is seen ending 3 cm above
the carina.  An enteric tube is noted coiling within the patient's
hiatal hernia.

There is persistent bilateral scarring and fibrotic change, with
tenting of the left hemidiaphragm and underlying bronchiectasis.
As before, the spiculated nodular density at the left midlung zone
is more prominent than in [REDACTED].  Malignancy cannot be entirely
excluded.

No definite superimposed acute airspace consolidation is seen.  No
pleural effusion or pneumothorax is identified, though the lung
apices are incompletely imaged on this study.

The cardiomediastinal silhouette is normal in size; calcification
is noted within the aortic arch.  No acute osseous abnormalities
are seen.  There has been significant interval improvement in
diffuse bilateral soft tissue emphysema.
IMPRESSION: 1.  Enteric tube coils within the patient's hiatal hernia.
2.  Spiculated nodular density at the left midlung zone is more
prominent than in [REDACTED]; malignancy cannot be entirely excluded.
3.  Persistent bilateral scarring and fibrotic change, with
underlying bronchiectasis.
4.  No definite superimposed airspace consolidation seen.
5.  Significant interval improvement in diffuse bilateral soft
tissue emphysema.

## 2012-03-12 DIAGNOSIS — R300 Dysuria: Secondary | ICD-10-CM | POA: Diagnosis not present

## 2012-03-12 DIAGNOSIS — E039 Hypothyroidism, unspecified: Secondary | ICD-10-CM | POA: Diagnosis not present

## 2012-04-04 DIAGNOSIS — D62 Acute posthemorrhagic anemia: Secondary | ICD-10-CM | POA: Diagnosis not present

## 2012-04-04 DIAGNOSIS — J449 Chronic obstructive pulmonary disease, unspecified: Secondary | ICD-10-CM | POA: Diagnosis present

## 2012-04-04 DIAGNOSIS — D869 Sarcoidosis, unspecified: Secondary | ICD-10-CM | POA: Diagnosis not present

## 2012-04-04 DIAGNOSIS — S72009A Fracture of unspecified part of neck of unspecified femur, initial encounter for closed fracture: Secondary | ICD-10-CM | POA: Diagnosis not present

## 2012-04-04 DIAGNOSIS — Z2821 Immunization not carried out because of patient refusal: Secondary | ICD-10-CM | POA: Diagnosis not present

## 2012-04-04 DIAGNOSIS — E039 Hypothyroidism, unspecified: Secondary | ICD-10-CM | POA: Diagnosis not present

## 2012-04-04 DIAGNOSIS — Z681 Body mass index (BMI) 19 or less, adult: Secondary | ICD-10-CM | POA: Diagnosis not present

## 2012-04-04 DIAGNOSIS — M1990 Unspecified osteoarthritis, unspecified site: Secondary | ICD-10-CM | POA: Diagnosis present

## 2012-04-04 DIAGNOSIS — K219 Gastro-esophageal reflux disease without esophagitis: Secondary | ICD-10-CM | POA: Diagnosis present

## 2012-04-04 DIAGNOSIS — K5900 Constipation, unspecified: Secondary | ICD-10-CM | POA: Diagnosis not present

## 2012-04-04 DIAGNOSIS — S72143A Displaced intertrochanteric fracture of unspecified femur, initial encounter for closed fracture: Secondary | ICD-10-CM | POA: Diagnosis not present

## 2012-04-04 DIAGNOSIS — Z4789 Encounter for other orthopedic aftercare: Secondary | ICD-10-CM | POA: Diagnosis not present

## 2012-04-04 DIAGNOSIS — M6281 Muscle weakness (generalized): Secondary | ICD-10-CM | POA: Diagnosis not present

## 2012-04-04 DIAGNOSIS — E119 Type 2 diabetes mellitus without complications: Secondary | ICD-10-CM | POA: Diagnosis not present

## 2012-04-04 DIAGNOSIS — S8990XA Unspecified injury of unspecified lower leg, initial encounter: Secondary | ICD-10-CM | POA: Diagnosis not present

## 2012-04-04 DIAGNOSIS — D696 Thrombocytopenia, unspecified: Secondary | ICD-10-CM | POA: Diagnosis not present

## 2012-04-04 DIAGNOSIS — S298XXA Other specified injuries of thorax, initial encounter: Secondary | ICD-10-CM | POA: Diagnosis not present

## 2012-04-04 DIAGNOSIS — I079 Rheumatic tricuspid valve disease, unspecified: Secondary | ICD-10-CM | POA: Diagnosis present

## 2012-04-04 DIAGNOSIS — Z882 Allergy status to sulfonamides status: Secondary | ICD-10-CM | POA: Diagnosis not present

## 2012-04-04 DIAGNOSIS — T1490XA Injury, unspecified, initial encounter: Secondary | ICD-10-CM | POA: Diagnosis not present

## 2012-04-04 DIAGNOSIS — M810 Age-related osteoporosis without current pathological fracture: Secondary | ICD-10-CM | POA: Diagnosis present

## 2012-04-04 DIAGNOSIS — M25569 Pain in unspecified knee: Secondary | ICD-10-CM | POA: Diagnosis not present

## 2012-04-04 DIAGNOSIS — Z79899 Other long term (current) drug therapy: Secondary | ICD-10-CM | POA: Diagnosis not present

## 2012-04-04 DIAGNOSIS — D6959 Other secondary thrombocytopenia: Secondary | ICD-10-CM | POA: Diagnosis not present

## 2012-04-04 DIAGNOSIS — E440 Moderate protein-calorie malnutrition: Secondary | ICD-10-CM | POA: Diagnosis not present

## 2012-04-04 DIAGNOSIS — I509 Heart failure, unspecified: Secondary | ICD-10-CM | POA: Diagnosis present

## 2012-04-10 DIAGNOSIS — M810 Age-related osteoporosis without current pathological fracture: Secondary | ICD-10-CM | POA: Diagnosis not present

## 2012-04-10 DIAGNOSIS — D696 Thrombocytopenia, unspecified: Secondary | ICD-10-CM | POA: Diagnosis not present

## 2012-04-10 DIAGNOSIS — M6281 Muscle weakness (generalized): Secondary | ICD-10-CM | POA: Diagnosis not present

## 2012-04-10 DIAGNOSIS — D869 Sarcoidosis, unspecified: Secondary | ICD-10-CM | POA: Diagnosis not present

## 2012-04-10 DIAGNOSIS — K5900 Constipation, unspecified: Secondary | ICD-10-CM | POA: Diagnosis not present

## 2012-04-10 DIAGNOSIS — S72009A Fracture of unspecified part of neck of unspecified femur, initial encounter for closed fracture: Secondary | ICD-10-CM | POA: Diagnosis not present

## 2012-04-10 DIAGNOSIS — S72143A Displaced intertrochanteric fracture of unspecified femur, initial encounter for closed fracture: Secondary | ICD-10-CM | POA: Diagnosis not present

## 2012-04-27 DIAGNOSIS — E119 Type 2 diabetes mellitus without complications: Secondary | ICD-10-CM | POA: Diagnosis not present

## 2012-04-27 DIAGNOSIS — L8991 Pressure ulcer of unspecified site, stage 1: Secondary | ICD-10-CM | POA: Diagnosis not present

## 2012-04-27 DIAGNOSIS — L8992 Pressure ulcer of unspecified site, stage 2: Secondary | ICD-10-CM | POA: Diagnosis not present

## 2012-04-27 DIAGNOSIS — L89109 Pressure ulcer of unspecified part of back, unspecified stage: Secondary | ICD-10-CM | POA: Diagnosis not present

## 2012-04-27 DIAGNOSIS — S7290XD Unspecified fracture of unspecified femur, subsequent encounter for closed fracture with routine healing: Secondary | ICD-10-CM | POA: Diagnosis not present

## 2012-04-27 DIAGNOSIS — L89309 Pressure ulcer of unspecified buttock, unspecified stage: Secondary | ICD-10-CM | POA: Diagnosis not present

## 2012-04-30 DIAGNOSIS — E119 Type 2 diabetes mellitus without complications: Secondary | ICD-10-CM | POA: Diagnosis not present

## 2012-04-30 DIAGNOSIS — L89309 Pressure ulcer of unspecified buttock, unspecified stage: Secondary | ICD-10-CM | POA: Diagnosis not present

## 2012-04-30 DIAGNOSIS — L8992 Pressure ulcer of unspecified site, stage 2: Secondary | ICD-10-CM | POA: Diagnosis not present

## 2012-04-30 DIAGNOSIS — L8991 Pressure ulcer of unspecified site, stage 1: Secondary | ICD-10-CM | POA: Diagnosis not present

## 2012-04-30 DIAGNOSIS — L89109 Pressure ulcer of unspecified part of back, unspecified stage: Secondary | ICD-10-CM | POA: Diagnosis not present

## 2012-04-30 DIAGNOSIS — S7290XD Unspecified fracture of unspecified femur, subsequent encounter for closed fracture with routine healing: Secondary | ICD-10-CM | POA: Diagnosis not present

## 2012-05-04 DIAGNOSIS — E119 Type 2 diabetes mellitus without complications: Secondary | ICD-10-CM | POA: Diagnosis not present

## 2012-05-04 DIAGNOSIS — L89109 Pressure ulcer of unspecified part of back, unspecified stage: Secondary | ICD-10-CM | POA: Diagnosis not present

## 2012-05-04 DIAGNOSIS — L8991 Pressure ulcer of unspecified site, stage 1: Secondary | ICD-10-CM | POA: Diagnosis not present

## 2012-05-04 DIAGNOSIS — L89309 Pressure ulcer of unspecified buttock, unspecified stage: Secondary | ICD-10-CM | POA: Diagnosis not present

## 2012-05-04 DIAGNOSIS — L8992 Pressure ulcer of unspecified site, stage 2: Secondary | ICD-10-CM | POA: Diagnosis not present

## 2012-05-04 DIAGNOSIS — S7290XD Unspecified fracture of unspecified femur, subsequent encounter for closed fracture with routine healing: Secondary | ICD-10-CM | POA: Diagnosis not present

## 2012-05-07 DIAGNOSIS — S7290XD Unspecified fracture of unspecified femur, subsequent encounter for closed fracture with routine healing: Secondary | ICD-10-CM | POA: Diagnosis not present

## 2012-05-07 DIAGNOSIS — L8992 Pressure ulcer of unspecified site, stage 2: Secondary | ICD-10-CM | POA: Diagnosis not present

## 2012-05-07 DIAGNOSIS — E119 Type 2 diabetes mellitus without complications: Secondary | ICD-10-CM | POA: Diagnosis not present

## 2012-05-07 DIAGNOSIS — L89309 Pressure ulcer of unspecified buttock, unspecified stage: Secondary | ICD-10-CM | POA: Diagnosis not present

## 2012-05-07 DIAGNOSIS — L8991 Pressure ulcer of unspecified site, stage 1: Secondary | ICD-10-CM | POA: Diagnosis not present

## 2012-05-07 DIAGNOSIS — L89109 Pressure ulcer of unspecified part of back, unspecified stage: Secondary | ICD-10-CM | POA: Diagnosis not present

## 2012-05-08 DIAGNOSIS — S7290XD Unspecified fracture of unspecified femur, subsequent encounter for closed fracture with routine healing: Secondary | ICD-10-CM | POA: Diagnosis not present

## 2012-05-08 DIAGNOSIS — L8992 Pressure ulcer of unspecified site, stage 2: Secondary | ICD-10-CM | POA: Diagnosis not present

## 2012-05-08 DIAGNOSIS — L8991 Pressure ulcer of unspecified site, stage 1: Secondary | ICD-10-CM | POA: Diagnosis not present

## 2012-05-08 DIAGNOSIS — L89109 Pressure ulcer of unspecified part of back, unspecified stage: Secondary | ICD-10-CM | POA: Diagnosis not present

## 2012-05-08 DIAGNOSIS — L89309 Pressure ulcer of unspecified buttock, unspecified stage: Secondary | ICD-10-CM | POA: Diagnosis not present

## 2012-05-08 DIAGNOSIS — E119 Type 2 diabetes mellitus without complications: Secondary | ICD-10-CM | POA: Diagnosis not present

## 2012-05-09 DIAGNOSIS — E782 Mixed hyperlipidemia: Secondary | ICD-10-CM | POA: Diagnosis not present

## 2012-05-09 DIAGNOSIS — E119 Type 2 diabetes mellitus without complications: Secondary | ICD-10-CM | POA: Diagnosis not present

## 2012-05-09 DIAGNOSIS — E039 Hypothyroidism, unspecified: Secondary | ICD-10-CM | POA: Diagnosis not present

## 2012-05-09 DIAGNOSIS — L8992 Pressure ulcer of unspecified site, stage 2: Secondary | ICD-10-CM | POA: Diagnosis not present

## 2012-05-09 DIAGNOSIS — R9389 Abnormal findings on diagnostic imaging of other specified body structures: Secondary | ICD-10-CM | POA: Diagnosis not present

## 2012-05-09 DIAGNOSIS — Z79899 Other long term (current) drug therapy: Secondary | ICD-10-CM | POA: Diagnosis not present

## 2012-05-09 DIAGNOSIS — M810 Age-related osteoporosis without current pathological fracture: Secondary | ICD-10-CM | POA: Diagnosis not present

## 2012-05-09 DIAGNOSIS — L89309 Pressure ulcer of unspecified buttock, unspecified stage: Secondary | ICD-10-CM | POA: Diagnosis not present

## 2012-05-09 DIAGNOSIS — D649 Anemia, unspecified: Secondary | ICD-10-CM | POA: Diagnosis not present

## 2012-05-09 DIAGNOSIS — S7290XD Unspecified fracture of unspecified femur, subsequent encounter for closed fracture with routine healing: Secondary | ICD-10-CM | POA: Diagnosis not present

## 2012-05-11 DIAGNOSIS — L8992 Pressure ulcer of unspecified site, stage 2: Secondary | ICD-10-CM | POA: Diagnosis not present

## 2012-05-11 DIAGNOSIS — L8991 Pressure ulcer of unspecified site, stage 1: Secondary | ICD-10-CM | POA: Diagnosis not present

## 2012-05-11 DIAGNOSIS — E119 Type 2 diabetes mellitus without complications: Secondary | ICD-10-CM | POA: Diagnosis not present

## 2012-05-11 DIAGNOSIS — S7290XD Unspecified fracture of unspecified femur, subsequent encounter for closed fracture with routine healing: Secondary | ICD-10-CM | POA: Diagnosis not present

## 2012-05-11 DIAGNOSIS — L89309 Pressure ulcer of unspecified buttock, unspecified stage: Secondary | ICD-10-CM | POA: Diagnosis not present

## 2012-05-11 DIAGNOSIS — L89109 Pressure ulcer of unspecified part of back, unspecified stage: Secondary | ICD-10-CM | POA: Diagnosis not present

## 2012-05-14 DIAGNOSIS — L89309 Pressure ulcer of unspecified buttock, unspecified stage: Secondary | ICD-10-CM | POA: Diagnosis not present

## 2012-05-14 DIAGNOSIS — L8991 Pressure ulcer of unspecified site, stage 1: Secondary | ICD-10-CM | POA: Diagnosis not present

## 2012-05-14 DIAGNOSIS — S7290XD Unspecified fracture of unspecified femur, subsequent encounter for closed fracture with routine healing: Secondary | ICD-10-CM | POA: Diagnosis not present

## 2012-05-14 DIAGNOSIS — E119 Type 2 diabetes mellitus without complications: Secondary | ICD-10-CM | POA: Diagnosis not present

## 2012-05-14 DIAGNOSIS — L89109 Pressure ulcer of unspecified part of back, unspecified stage: Secondary | ICD-10-CM | POA: Diagnosis not present

## 2012-05-14 DIAGNOSIS — L8992 Pressure ulcer of unspecified site, stage 2: Secondary | ICD-10-CM | POA: Diagnosis not present

## 2012-05-15 DIAGNOSIS — L89309 Pressure ulcer of unspecified buttock, unspecified stage: Secondary | ICD-10-CM | POA: Diagnosis not present

## 2012-05-15 DIAGNOSIS — E119 Type 2 diabetes mellitus without complications: Secondary | ICD-10-CM | POA: Diagnosis not present

## 2012-05-15 DIAGNOSIS — L89109 Pressure ulcer of unspecified part of back, unspecified stage: Secondary | ICD-10-CM | POA: Diagnosis not present

## 2012-05-15 DIAGNOSIS — L8991 Pressure ulcer of unspecified site, stage 1: Secondary | ICD-10-CM | POA: Diagnosis not present

## 2012-05-15 DIAGNOSIS — L8992 Pressure ulcer of unspecified site, stage 2: Secondary | ICD-10-CM | POA: Diagnosis not present

## 2012-05-15 DIAGNOSIS — S7290XD Unspecified fracture of unspecified femur, subsequent encounter for closed fracture with routine healing: Secondary | ICD-10-CM | POA: Diagnosis not present

## 2012-05-16 DIAGNOSIS — L8992 Pressure ulcer of unspecified site, stage 2: Secondary | ICD-10-CM | POA: Diagnosis not present

## 2012-05-16 DIAGNOSIS — L89309 Pressure ulcer of unspecified buttock, unspecified stage: Secondary | ICD-10-CM | POA: Diagnosis not present

## 2012-05-16 DIAGNOSIS — L89109 Pressure ulcer of unspecified part of back, unspecified stage: Secondary | ICD-10-CM | POA: Diagnosis not present

## 2012-05-16 DIAGNOSIS — S7290XD Unspecified fracture of unspecified femur, subsequent encounter for closed fracture with routine healing: Secondary | ICD-10-CM | POA: Diagnosis not present

## 2012-05-16 DIAGNOSIS — L8991 Pressure ulcer of unspecified site, stage 1: Secondary | ICD-10-CM | POA: Diagnosis not present

## 2012-05-16 DIAGNOSIS — E119 Type 2 diabetes mellitus without complications: Secondary | ICD-10-CM | POA: Diagnosis not present

## 2012-05-18 DIAGNOSIS — E119 Type 2 diabetes mellitus without complications: Secondary | ICD-10-CM | POA: Diagnosis not present

## 2012-05-18 DIAGNOSIS — S7290XD Unspecified fracture of unspecified femur, subsequent encounter for closed fracture with routine healing: Secondary | ICD-10-CM | POA: Diagnosis not present

## 2012-05-18 DIAGNOSIS — L8992 Pressure ulcer of unspecified site, stage 2: Secondary | ICD-10-CM | POA: Diagnosis not present

## 2012-05-18 DIAGNOSIS — L89109 Pressure ulcer of unspecified part of back, unspecified stage: Secondary | ICD-10-CM | POA: Diagnosis not present

## 2012-05-18 DIAGNOSIS — L8991 Pressure ulcer of unspecified site, stage 1: Secondary | ICD-10-CM | POA: Diagnosis not present

## 2012-05-18 DIAGNOSIS — L89309 Pressure ulcer of unspecified buttock, unspecified stage: Secondary | ICD-10-CM | POA: Diagnosis not present

## 2012-05-21 DIAGNOSIS — E119 Type 2 diabetes mellitus without complications: Secondary | ICD-10-CM | POA: Diagnosis not present

## 2012-05-21 DIAGNOSIS — E1149 Type 2 diabetes mellitus with other diabetic neurological complication: Secondary | ICD-10-CM | POA: Diagnosis not present

## 2012-05-22 DIAGNOSIS — L8992 Pressure ulcer of unspecified site, stage 2: Secondary | ICD-10-CM | POA: Diagnosis not present

## 2012-05-22 DIAGNOSIS — L89109 Pressure ulcer of unspecified part of back, unspecified stage: Secondary | ICD-10-CM | POA: Diagnosis not present

## 2012-05-22 DIAGNOSIS — L89309 Pressure ulcer of unspecified buttock, unspecified stage: Secondary | ICD-10-CM | POA: Diagnosis not present

## 2012-05-22 DIAGNOSIS — S7290XD Unspecified fracture of unspecified femur, subsequent encounter for closed fracture with routine healing: Secondary | ICD-10-CM | POA: Diagnosis not present

## 2012-05-22 DIAGNOSIS — E119 Type 2 diabetes mellitus without complications: Secondary | ICD-10-CM | POA: Diagnosis not present

## 2012-05-22 DIAGNOSIS — L8991 Pressure ulcer of unspecified site, stage 1: Secondary | ICD-10-CM | POA: Diagnosis not present

## 2012-05-23 DIAGNOSIS — S7290XD Unspecified fracture of unspecified femur, subsequent encounter for closed fracture with routine healing: Secondary | ICD-10-CM | POA: Diagnosis not present

## 2012-05-23 DIAGNOSIS — L8992 Pressure ulcer of unspecified site, stage 2: Secondary | ICD-10-CM | POA: Diagnosis not present

## 2012-05-23 DIAGNOSIS — L89309 Pressure ulcer of unspecified buttock, unspecified stage: Secondary | ICD-10-CM | POA: Diagnosis not present

## 2012-05-23 DIAGNOSIS — L8991 Pressure ulcer of unspecified site, stage 1: Secondary | ICD-10-CM | POA: Diagnosis not present

## 2012-05-23 DIAGNOSIS — E119 Type 2 diabetes mellitus without complications: Secondary | ICD-10-CM | POA: Diagnosis not present

## 2012-05-23 DIAGNOSIS — L89109 Pressure ulcer of unspecified part of back, unspecified stage: Secondary | ICD-10-CM | POA: Diagnosis not present

## 2012-05-24 DIAGNOSIS — E119 Type 2 diabetes mellitus without complications: Secondary | ICD-10-CM | POA: Diagnosis not present

## 2012-05-24 DIAGNOSIS — L89309 Pressure ulcer of unspecified buttock, unspecified stage: Secondary | ICD-10-CM | POA: Diagnosis not present

## 2012-05-24 DIAGNOSIS — L8991 Pressure ulcer of unspecified site, stage 1: Secondary | ICD-10-CM | POA: Diagnosis not present

## 2012-05-24 DIAGNOSIS — S7290XD Unspecified fracture of unspecified femur, subsequent encounter for closed fracture with routine healing: Secondary | ICD-10-CM | POA: Diagnosis not present

## 2012-05-24 DIAGNOSIS — L8992 Pressure ulcer of unspecified site, stage 2: Secondary | ICD-10-CM | POA: Diagnosis not present

## 2012-05-24 DIAGNOSIS — L89109 Pressure ulcer of unspecified part of back, unspecified stage: Secondary | ICD-10-CM | POA: Diagnosis not present

## 2012-05-28 DIAGNOSIS — E039 Hypothyroidism, unspecified: Secondary | ICD-10-CM | POA: Diagnosis not present

## 2012-05-28 DIAGNOSIS — D649 Anemia, unspecified: Secondary | ICD-10-CM | POA: Diagnosis not present

## 2012-05-28 DIAGNOSIS — E119 Type 2 diabetes mellitus without complications: Secondary | ICD-10-CM | POA: Diagnosis not present

## 2012-05-28 DIAGNOSIS — E782 Mixed hyperlipidemia: Secondary | ICD-10-CM | POA: Diagnosis not present

## 2012-05-28 DIAGNOSIS — M810 Age-related osteoporosis without current pathological fracture: Secondary | ICD-10-CM | POA: Diagnosis not present

## 2012-05-29 DIAGNOSIS — L89109 Pressure ulcer of unspecified part of back, unspecified stage: Secondary | ICD-10-CM | POA: Diagnosis not present

## 2012-05-29 DIAGNOSIS — E119 Type 2 diabetes mellitus without complications: Secondary | ICD-10-CM | POA: Diagnosis not present

## 2012-05-29 DIAGNOSIS — L8992 Pressure ulcer of unspecified site, stage 2: Secondary | ICD-10-CM | POA: Diagnosis not present

## 2012-05-29 DIAGNOSIS — L89309 Pressure ulcer of unspecified buttock, unspecified stage: Secondary | ICD-10-CM | POA: Diagnosis not present

## 2012-05-29 DIAGNOSIS — L8991 Pressure ulcer of unspecified site, stage 1: Secondary | ICD-10-CM | POA: Diagnosis not present

## 2012-05-29 DIAGNOSIS — S7290XD Unspecified fracture of unspecified femur, subsequent encounter for closed fracture with routine healing: Secondary | ICD-10-CM | POA: Diagnosis not present

## 2012-05-30 DIAGNOSIS — L89309 Pressure ulcer of unspecified buttock, unspecified stage: Secondary | ICD-10-CM | POA: Diagnosis not present

## 2012-05-30 DIAGNOSIS — L8992 Pressure ulcer of unspecified site, stage 2: Secondary | ICD-10-CM | POA: Diagnosis not present

## 2012-05-30 DIAGNOSIS — L89109 Pressure ulcer of unspecified part of back, unspecified stage: Secondary | ICD-10-CM | POA: Diagnosis not present

## 2012-05-30 DIAGNOSIS — L8991 Pressure ulcer of unspecified site, stage 1: Secondary | ICD-10-CM | POA: Diagnosis not present

## 2012-05-30 DIAGNOSIS — S7290XD Unspecified fracture of unspecified femur, subsequent encounter for closed fracture with routine healing: Secondary | ICD-10-CM | POA: Diagnosis not present

## 2012-05-30 DIAGNOSIS — E119 Type 2 diabetes mellitus without complications: Secondary | ICD-10-CM | POA: Diagnosis not present

## 2012-06-01 DIAGNOSIS — L89309 Pressure ulcer of unspecified buttock, unspecified stage: Secondary | ICD-10-CM | POA: Diagnosis not present

## 2012-06-01 DIAGNOSIS — L89109 Pressure ulcer of unspecified part of back, unspecified stage: Secondary | ICD-10-CM | POA: Diagnosis not present

## 2012-06-01 DIAGNOSIS — L8991 Pressure ulcer of unspecified site, stage 1: Secondary | ICD-10-CM | POA: Diagnosis not present

## 2012-06-01 DIAGNOSIS — E119 Type 2 diabetes mellitus without complications: Secondary | ICD-10-CM | POA: Diagnosis not present

## 2012-06-01 DIAGNOSIS — L8992 Pressure ulcer of unspecified site, stage 2: Secondary | ICD-10-CM | POA: Diagnosis not present

## 2012-06-01 DIAGNOSIS — S7290XD Unspecified fracture of unspecified femur, subsequent encounter for closed fracture with routine healing: Secondary | ICD-10-CM | POA: Diagnosis not present

## 2012-06-04 DIAGNOSIS — L8992 Pressure ulcer of unspecified site, stage 2: Secondary | ICD-10-CM | POA: Diagnosis not present

## 2012-06-04 DIAGNOSIS — E119 Type 2 diabetes mellitus without complications: Secondary | ICD-10-CM | POA: Diagnosis not present

## 2012-06-04 DIAGNOSIS — S7290XD Unspecified fracture of unspecified femur, subsequent encounter for closed fracture with routine healing: Secondary | ICD-10-CM | POA: Diagnosis not present

## 2012-06-04 DIAGNOSIS — L89309 Pressure ulcer of unspecified buttock, unspecified stage: Secondary | ICD-10-CM | POA: Diagnosis not present

## 2012-06-04 DIAGNOSIS — L8991 Pressure ulcer of unspecified site, stage 1: Secondary | ICD-10-CM | POA: Diagnosis not present

## 2012-06-04 DIAGNOSIS — L89109 Pressure ulcer of unspecified part of back, unspecified stage: Secondary | ICD-10-CM | POA: Diagnosis not present

## 2012-06-06 DIAGNOSIS — E119 Type 2 diabetes mellitus without complications: Secondary | ICD-10-CM | POA: Diagnosis not present

## 2012-06-06 DIAGNOSIS — L89109 Pressure ulcer of unspecified part of back, unspecified stage: Secondary | ICD-10-CM | POA: Diagnosis not present

## 2012-06-06 DIAGNOSIS — L8992 Pressure ulcer of unspecified site, stage 2: Secondary | ICD-10-CM | POA: Diagnosis not present

## 2012-06-06 DIAGNOSIS — L8991 Pressure ulcer of unspecified site, stage 1: Secondary | ICD-10-CM | POA: Diagnosis not present

## 2012-06-06 DIAGNOSIS — S7290XD Unspecified fracture of unspecified femur, subsequent encounter for closed fracture with routine healing: Secondary | ICD-10-CM | POA: Diagnosis not present

## 2012-06-06 DIAGNOSIS — L89309 Pressure ulcer of unspecified buttock, unspecified stage: Secondary | ICD-10-CM | POA: Diagnosis not present

## 2012-06-11 DIAGNOSIS — H2510 Age-related nuclear cataract, unspecified eye: Secondary | ICD-10-CM | POA: Diagnosis not present

## 2012-06-11 DIAGNOSIS — E119 Type 2 diabetes mellitus without complications: Secondary | ICD-10-CM | POA: Diagnosis not present

## 2012-06-12 DIAGNOSIS — L89309 Pressure ulcer of unspecified buttock, unspecified stage: Secondary | ICD-10-CM | POA: Diagnosis not present

## 2012-06-12 DIAGNOSIS — E119 Type 2 diabetes mellitus without complications: Secondary | ICD-10-CM | POA: Diagnosis not present

## 2012-06-12 DIAGNOSIS — S7290XD Unspecified fracture of unspecified femur, subsequent encounter for closed fracture with routine healing: Secondary | ICD-10-CM | POA: Diagnosis not present

## 2012-06-12 DIAGNOSIS — L8991 Pressure ulcer of unspecified site, stage 1: Secondary | ICD-10-CM | POA: Diagnosis not present

## 2012-06-12 DIAGNOSIS — L8992 Pressure ulcer of unspecified site, stage 2: Secondary | ICD-10-CM | POA: Diagnosis not present

## 2012-06-12 DIAGNOSIS — L89109 Pressure ulcer of unspecified part of back, unspecified stage: Secondary | ICD-10-CM | POA: Diagnosis not present

## 2012-06-22 DIAGNOSIS — L8991 Pressure ulcer of unspecified site, stage 1: Secondary | ICD-10-CM | POA: Diagnosis not present

## 2012-06-22 DIAGNOSIS — L8992 Pressure ulcer of unspecified site, stage 2: Secondary | ICD-10-CM | POA: Diagnosis not present

## 2012-06-22 DIAGNOSIS — L89309 Pressure ulcer of unspecified buttock, unspecified stage: Secondary | ICD-10-CM | POA: Diagnosis not present

## 2012-06-22 DIAGNOSIS — S7290XD Unspecified fracture of unspecified femur, subsequent encounter for closed fracture with routine healing: Secondary | ICD-10-CM | POA: Diagnosis not present

## 2012-06-22 DIAGNOSIS — E119 Type 2 diabetes mellitus without complications: Secondary | ICD-10-CM | POA: Diagnosis not present

## 2012-06-22 DIAGNOSIS — L89109 Pressure ulcer of unspecified part of back, unspecified stage: Secondary | ICD-10-CM | POA: Diagnosis not present

## 2012-07-09 DIAGNOSIS — S72143A Displaced intertrochanteric fracture of unspecified femur, initial encounter for closed fracture: Secondary | ICD-10-CM | POA: Diagnosis not present

## 2012-09-18 DIAGNOSIS — R110 Nausea: Secondary | ICD-10-CM | POA: Diagnosis not present

## 2012-09-18 DIAGNOSIS — M25539 Pain in unspecified wrist: Secondary | ICD-10-CM | POA: Diagnosis not present

## 2012-09-18 DIAGNOSIS — M159 Polyosteoarthritis, unspecified: Secondary | ICD-10-CM | POA: Diagnosis not present

## 2012-09-22 DIAGNOSIS — M1990 Unspecified osteoarthritis, unspecified site: Secondary | ICD-10-CM | POA: Diagnosis not present

## 2012-09-22 DIAGNOSIS — T148XXA Other injury of unspecified body region, initial encounter: Secondary | ICD-10-CM | POA: Diagnosis not present

## 2012-10-09 DIAGNOSIS — E782 Mixed hyperlipidemia: Secondary | ICD-10-CM | POA: Diagnosis not present

## 2012-10-09 DIAGNOSIS — M810 Age-related osteoporosis without current pathological fracture: Secondary | ICD-10-CM | POA: Diagnosis not present

## 2012-10-09 DIAGNOSIS — E119 Type 2 diabetes mellitus without complications: Secondary | ICD-10-CM | POA: Diagnosis not present

## 2012-10-09 DIAGNOSIS — E039 Hypothyroidism, unspecified: Secondary | ICD-10-CM | POA: Diagnosis not present

## 2012-10-09 DIAGNOSIS — D649 Anemia, unspecified: Secondary | ICD-10-CM | POA: Diagnosis not present

## 2012-10-23 ENCOUNTER — Encounter (INDEPENDENT_AMBULATORY_CARE_PROVIDER_SITE_OTHER): Payer: Self-pay | Admitting: *Deleted

## 2012-11-19 ENCOUNTER — Ambulatory Visit (INDEPENDENT_AMBULATORY_CARE_PROVIDER_SITE_OTHER): Payer: Medicare Other | Admitting: Internal Medicine

## 2012-11-28 ENCOUNTER — Ambulatory Visit (INDEPENDENT_AMBULATORY_CARE_PROVIDER_SITE_OTHER): Payer: Medicare Other | Admitting: Internal Medicine

## 2012-11-28 ENCOUNTER — Encounter (INDEPENDENT_AMBULATORY_CARE_PROVIDER_SITE_OTHER): Payer: Self-pay | Admitting: Internal Medicine

## 2012-11-28 VITALS — BP 120/84 | HR 80 | Temp 97.9°F | Ht <= 58 in | Wt 77.3 lb

## 2012-11-28 DIAGNOSIS — K831 Obstruction of bile duct: Secondary | ICD-10-CM

## 2012-11-28 DIAGNOSIS — R630 Anorexia: Secondary | ICD-10-CM | POA: Diagnosis not present

## 2012-11-28 DIAGNOSIS — E039 Hypothyroidism, unspecified: Secondary | ICD-10-CM | POA: Diagnosis not present

## 2012-11-28 LAB — COMPREHENSIVE METABOLIC PANEL
ALT: 10 U/L (ref 0–35)
CO2: 32 mEq/L (ref 19–32)
Creat: 0.76 mg/dL (ref 0.50–1.10)
Total Bilirubin: 0.8 mg/dL (ref 0.3–1.2)

## 2012-11-28 NOTE — Progress Notes (Signed)
Subjective:     Patient ID: Misty [name], female   DOB: [birth year], 79 y.o.   MRN: [id number]  HPIHere today for f/u of a bile duct obstruction. She tells me she is doing good except that she sustained a fall in December. She broke her left hip and underwent rehab at [facility]. There is not belly pain. Her appetite is good. She has gained 1 pound since her last visit in July. No fever, nausea or vomiting. She takes Miralax prn for constipation. No melena or rectal bleeding. Two years ago she presented with obstructive jaundice. She initially was presumed to have papillary stenosis and underwent sphincterotomy and biliary stenting for very dilated system. On subsequent exam she was found to have stones and also had Mirrizzi syndrome. She had cholecystectomy in December 2012 with slow recovery due to the respiratory problem.  She had her fourth ERCP in May 2013 with removal of biliary stent, extension of sphincterotomy and removal of debris and stones from the bile duct.  Dr. Rosann Auerbach apparently stopped the Urso.   Hepatic Function Panel     Component Value Date/Time   PROT 7.0 11/14/2011 1405   ALBUMIN 3.8 11/14/2011 1405   AST 19 11/14/2011 1405   ALT 9 11/14/2011 1405   ALKPHOS 73 11/14/2011 1405   BILITOT 0.5 11/14/2011 1405   BILIDIR 0.1 11/14/2011 1405   IBILI 0.4 11/14/2011 1405        Review of Systems see hpi . Current Outpatient Prescriptions  Medication Sig Dispense Refill  . diazepam (VALIUM) 2 MG tablet Take 2 mg by mouth every 6 (six) hours as needed for anxiety.      . diclofenac sodium (VOLTAREN) 1 % GEL Apply 1 application topically 4 (four) times daily. For arthritis pain      . glipiZIDE (GLUCOTROL XL) 10 MG 24 hr tablet Take 5 mg by mouth every morning. 2 1/2 mg as needed      . levothyroxine (SYNTHROID, LEVOTHROID) 137 MCG tablet Take 137 mcg by mouth daily.      . polyethylene glycol (MIRALAX / GLYCOLAX) packet Take 17 g by mouth as needed.      .  traMADol (ULTRAM) 50 MG tablet Take 50 mg by mouth 3 (three) times daily as needed.       . ursodiol (ACTIGALL) 300 MG capsule Take 1 capsule (300 mg total) by mouth daily.  30 capsule  11   No current facility-administered medications for this visit.   Past Medical History  Diagnosis Date  . Anorexia   . Weight loss   . Jaundice   . Biliary obstruction   . Hypothyroidism   . Diabetes mellitus   . GERD (gastroesophageal reflux disease)   . Sarcoidosis   . Congestive heart failure December, 2012    [facility]   Past Surgical History  Procedure Laterality Date  . Vagina surgery  as a teen  . Ercp  02/16/2011    Procedure: ENDOSCOPIC RETROGRADE CHOLANGIOPANCREATOGRAPHY (ERCP);  Surgeon: Malissa Hippo, MD;  Location: [facility];  Service: Endoscopy;  Laterality: N/A;  7:30/with Stent Removal  . Biliary stent placement  02/16/2011    Procedure: BILIARY STENT PLACEMENT;  Surgeon: Malissa Hippo, MD;  Location: [facility];  Service: Endoscopy;  Laterality: N/A;  . Sphincterotomy  02/16/2011    Procedure: SPHINCTEROTOMY;  Surgeon: Malissa Hippo, MD;  Location: [facility];  Service: Endoscopy;  Laterality: N/A;  . Ercp  10/08/2010  .  Appendectomy    . Ercp  04/20/2011    Procedure: ENDOSCOPIC RETROGRADE CHOLANGIOPANCREATOGRAPHY (ERCP);  Surgeon: Malissa Hippo, MD;  Location: [facility];  Service: Endoscopy;  Laterality: N/A;  removal of stent, removal of multiple stones with basket  . Spyglass cholangioscopy  04/20/2011    Procedure: SPYGLASS CHOLANGIOSCOPY;  Surgeon: Malissa Hippo, MD;  Location: [facility];  Service: Endoscopy;  Laterality: N/A;  . Biliary stent placement  04/20/2011    Procedure: BILIARY STENT PLACEMENT;  Surgeon: Malissa Hippo, MD;  Location: [facility];  Service: Endoscopy;  Laterality: N/A;  . Cholecystectomy  04/27/2011    Procedure: LAPAROSCOPIC CHOLECYSTECTOMY WITH INTRAOPERATIVE CHOLANGIOGRAM;  Surgeon: Fabio Bering, MD;  Location: [facility];  Service:  General;  Laterality: N/A;  . Ercp  09/14/2011    Procedure: ENDOSCOPIC RETROGRADE CHOLANGIOPANCREATOGRAPHY (ERCP);  Surgeon: Malissa Hippo, MD;  Location: [facility];  Service: Endoscopy;  Laterality: N/A;  ERCP with Stent Removal  . Spyglass cholangioscopy  09/14/2011    Procedure: SPYGLASS CHOLANGIOSCOPY;  Surgeon: Malissa Hippo, MD;  Location: [facility];  Service: Endoscopy;  Laterality: N/A;  . Sphincterotomy  09/14/2011    Procedure: SPHINCTEROTOMY;  Surgeon: Malissa Hippo, MD;  Location: [facility];  Service: Endoscopy;  Laterality: N/A;   Allergies  Allergen Reactions  . Nitrofuran Derivatives Hives  . Sulfa Antibiotics Hives and Swelling        Objective:   Physical Exam  Filed Vitals:   11/28/12 1036  BP: 120/84  Pulse: 80  Temp: 97.9 F (36.6 C)  Height: 4\' 7"  (1.397 m)  Weight: 77 lb 4.8 oz (35.063 kg)  Alert and oriented. Skin warm and dry. Oral mucosa is moist.   . Sclera anicteric, conjunctivae is pink. Thyroid not enlarged. No cervical lymphadenopathy. Lungs clear. Heart regular rate and rhythm.  Abdomen is soft. Bowel sounds are positive. No hepatomegaly. No abdominal masses felt. No tenderness.  No edema to lower extremities.  Walks with a walker.      Assessment:    Assessment:  Biliary obstruction secondary to papillary stenosis and Mirizzi syndrome. She had cholecystectomy in December 2012. Since her last ERCP she has experienced no GI symptoms. Her LFT have remained normal.      Plan:    Cmet today and OV in 1 yr.  She needs to resume the Urso. I have spoken to the son.

## 2012-11-28 NOTE — Patient Instructions (Addendum)
OV in 1 yr. 

## 2012-12-03 ENCOUNTER — Other Ambulatory Visit (INDEPENDENT_AMBULATORY_CARE_PROVIDER_SITE_OTHER): Payer: Self-pay | Admitting: Internal Medicine

## 2012-12-03 DIAGNOSIS — K8050 Calculus of bile duct without cholangitis or cholecystitis without obstruction: Secondary | ICD-10-CM

## 2012-12-03 MED ORDER — URSODIOL 300 MG PO CAPS: 300.0000 mg | ORAL_CAPSULE | Freq: Every day | ORAL | Status: DC

## 2013-02-07 DIAGNOSIS — E119 Type 2 diabetes mellitus without complications: Secondary | ICD-10-CM | POA: Diagnosis not present

## 2013-02-07 DIAGNOSIS — H2510 Age-related nuclear cataract, unspecified eye: Secondary | ICD-10-CM | POA: Diagnosis not present

## 2013-02-07 DIAGNOSIS — H04129 Dry eye syndrome of unspecified lacrimal gland: Secondary | ICD-10-CM | POA: Diagnosis not present

## 2013-02-15 DIAGNOSIS — D649 Anemia, unspecified: Secondary | ICD-10-CM | POA: Diagnosis not present

## 2013-02-15 DIAGNOSIS — R300 Dysuria: Secondary | ICD-10-CM | POA: Diagnosis not present

## 2013-02-15 DIAGNOSIS — E782 Mixed hyperlipidemia: Secondary | ICD-10-CM | POA: Diagnosis not present

## 2013-02-15 DIAGNOSIS — E119 Type 2 diabetes mellitus without complications: Secondary | ICD-10-CM | POA: Diagnosis not present

## 2013-02-15 DIAGNOSIS — R5381 Other malaise: Secondary | ICD-10-CM | POA: Diagnosis not present

## 2013-02-15 DIAGNOSIS — IMO0001 Reserved for inherently not codable concepts without codable children: Secondary | ICD-10-CM | POA: Diagnosis not present

## 2013-02-15 DIAGNOSIS — E039 Hypothyroidism, unspecified: Secondary | ICD-10-CM | POA: Diagnosis not present

## 2013-02-18 DIAGNOSIS — M810 Age-related osteoporosis without current pathological fracture: Secondary | ICD-10-CM | POA: Diagnosis not present

## 2013-02-18 DIAGNOSIS — D649 Anemia, unspecified: Secondary | ICD-10-CM | POA: Diagnosis not present

## 2013-02-18 DIAGNOSIS — E119 Type 2 diabetes mellitus without complications: Secondary | ICD-10-CM | POA: Diagnosis not present

## 2013-02-18 DIAGNOSIS — E039 Hypothyroidism, unspecified: Secondary | ICD-10-CM | POA: Diagnosis not present

## 2013-02-18 DIAGNOSIS — E782 Mixed hyperlipidemia: Secondary | ICD-10-CM | POA: Diagnosis not present

## 2013-03-21 HISTORY — PX: HIP SURGERY: SHX245

## 2013-07-19 DIAGNOSIS — D649 Anemia, unspecified: Secondary | ICD-10-CM | POA: Diagnosis not present

## 2013-07-19 DIAGNOSIS — R350 Frequency of micturition: Secondary | ICD-10-CM | POA: Diagnosis not present

## 2013-07-19 DIAGNOSIS — IMO0001 Reserved for inherently not codable concepts without codable children: Secondary | ICD-10-CM | POA: Diagnosis not present

## 2013-07-19 DIAGNOSIS — E039 Hypothyroidism, unspecified: Secondary | ICD-10-CM | POA: Diagnosis not present

## 2013-07-19 DIAGNOSIS — N3090 Cystitis, unspecified without hematuria: Secondary | ICD-10-CM | POA: Diagnosis not present

## 2013-07-19 DIAGNOSIS — R5381 Other malaise: Secondary | ICD-10-CM | POA: Diagnosis not present

## 2013-07-19 DIAGNOSIS — E782 Mixed hyperlipidemia: Secondary | ICD-10-CM | POA: Diagnosis not present

## 2013-07-24 DIAGNOSIS — D649 Anemia, unspecified: Secondary | ICD-10-CM | POA: Diagnosis not present

## 2013-07-24 DIAGNOSIS — E039 Hypothyroidism, unspecified: Secondary | ICD-10-CM | POA: Diagnosis not present

## 2013-07-24 DIAGNOSIS — E119 Type 2 diabetes mellitus without complications: Secondary | ICD-10-CM | POA: Diagnosis not present

## 2013-07-24 DIAGNOSIS — M810 Age-related osteoporosis without current pathological fracture: Secondary | ICD-10-CM | POA: Diagnosis not present

## 2013-07-24 DIAGNOSIS — E782 Mixed hyperlipidemia: Secondary | ICD-10-CM | POA: Diagnosis not present

## 2013-08-21 DIAGNOSIS — E119 Type 2 diabetes mellitus without complications: Secondary | ICD-10-CM | POA: Diagnosis not present

## 2013-08-21 DIAGNOSIS — E1149 Type 2 diabetes mellitus with other diabetic neurological complication: Secondary | ICD-10-CM | POA: Diagnosis not present

## 2013-11-06 ENCOUNTER — Encounter (INDEPENDENT_AMBULATORY_CARE_PROVIDER_SITE_OTHER): Payer: Self-pay | Admitting: *Deleted

## 2013-11-18 DIAGNOSIS — D649 Anemia, unspecified: Secondary | ICD-10-CM | POA: Diagnosis not present

## 2013-11-18 DIAGNOSIS — N3090 Cystitis, unspecified without hematuria: Secondary | ICD-10-CM | POA: Diagnosis not present

## 2013-11-18 DIAGNOSIS — E039 Hypothyroidism, unspecified: Secondary | ICD-10-CM | POA: Diagnosis not present

## 2013-11-18 DIAGNOSIS — E782 Mixed hyperlipidemia: Secondary | ICD-10-CM | POA: Diagnosis not present

## 2013-11-18 DIAGNOSIS — E119 Type 2 diabetes mellitus without complications: Secondary | ICD-10-CM | POA: Diagnosis not present

## 2013-11-18 DIAGNOSIS — M1990 Unspecified osteoarthritis, unspecified site: Secondary | ICD-10-CM | POA: Diagnosis not present

## 2013-11-26 DIAGNOSIS — E039 Hypothyroidism, unspecified: Secondary | ICD-10-CM | POA: Diagnosis not present

## 2013-11-26 DIAGNOSIS — M810 Age-related osteoporosis without current pathological fracture: Secondary | ICD-10-CM | POA: Diagnosis not present

## 2013-11-26 DIAGNOSIS — D649 Anemia, unspecified: Secondary | ICD-10-CM | POA: Diagnosis not present

## 2013-11-26 DIAGNOSIS — E119 Type 2 diabetes mellitus without complications: Secondary | ICD-10-CM | POA: Diagnosis not present

## 2013-11-26 DIAGNOSIS — H6120 Impacted cerumen, unspecified ear: Secondary | ICD-10-CM | POA: Diagnosis not present

## 2013-11-26 DIAGNOSIS — E782 Mixed hyperlipidemia: Secondary | ICD-10-CM | POA: Diagnosis not present

## 2013-12-17 ENCOUNTER — Other Ambulatory Visit (INDEPENDENT_AMBULATORY_CARE_PROVIDER_SITE_OTHER): Payer: Self-pay | Admitting: Internal Medicine

## 2014-02-11 ENCOUNTER — Encounter (INDEPENDENT_AMBULATORY_CARE_PROVIDER_SITE_OTHER): Payer: Self-pay | Admitting: Internal Medicine

## 2014-02-11 ENCOUNTER — Ambulatory Visit (INDEPENDENT_AMBULATORY_CARE_PROVIDER_SITE_OTHER): Payer: Medicare Other | Admitting: Internal Medicine

## 2014-02-11 VITALS — BP 118/70 | HR 76 | Temp 97.0°F | Resp 18 | Ht <= 58 in | Wt 84.0 lb

## 2014-02-11 DIAGNOSIS — K5901 Slow transit constipation: Secondary | ICD-10-CM | POA: Diagnosis not present

## 2014-02-11 DIAGNOSIS — K831 Obstruction of bile duct: Secondary | ICD-10-CM | POA: Diagnosis not present

## 2014-02-11 MED ORDER — DOCUSATE SODIUM 100 MG PO CAPS: 200.0000 mg | ORAL_CAPSULE | Freq: Every day | ORAL | Status: DC

## 2014-02-11 NOTE — Progress Notes (Signed)
Presenting complaint;  History of bile duct stricture and constipation.  Subjective:  Patient is an 80 year old Caucasian female who presents for scheduled visit accompanied by her son Misty [name]. She was last seen in July 2014. She denies abdominal pain nausea or vomiting. She has good appetite. She has gained 7 pounds since her last visit. She is not having any side effects with Urso. She continues to have problems with constipation. She has a bowel movement 2-3 times a week. Some of her stools are hard. She is using polyethylene glycol but on as needed basis. She denies melena or rectal bleeding. She also denies pruritus. Since she had surgery for left hip fracture last year she's been using walker to move around.   Current Medications: Outpatient Encounter Prescriptions as of 02/11/2014  Medication Sig  . diazepam (VALIUM) 2 MG tablet Take 2 mg by mouth every 6 (six) hours as needed for anxiety.  . diclofenac sodium (VOLTAREN) 1 % GEL Apply 1 application topically 4 (four) times daily. For arthritis pain  . glipiZIDE (GLUCOTROL XL) 10 MG 24 hr tablet Take 5 mg by mouth every morning. Patient states that she takes 1/2 of 5 mg tablet daily  . levothyroxine (SYNTHROID, LEVOTHROID) 137 MCG tablet Take 100 mcg by mouth daily.   . polyethylene glycol (MIRALAX / GLYCOLAX) packet Take 17 g by mouth as needed.  . traMADol (ULTRAM) 50 MG tablet Take 50 mg by mouth as needed.   . ursodiol (ACTIGALL) 300 MG capsule TAKE 1 CAPSULE BY MOUTH DAILY.    Objective: Blood pressure 118/70, pulse 76, temperature 97 F (36.1 C), temperature source Oral, resp. rate 18, height 4\' 7"  (1.397 m), weight 84 lb (38.102 kg). Pleasant thin Caucasian female who is in no acute distress. Conjunctiva is pink. Sclera is nonicteric Oropharyngeal mucosa is normal. No neck masses or thyromegaly noted. Cardiac exam with regular rhythm normal S1 and S2. No murmur or gallop noted. Lungs are clear to auscultation. Abdomen is  flat soft and nontender without organomegaly or masses.  No LE edema or clubbing noted.  Labs/studies Results:   recent lab studies have been requested from Dr. Garner Nashaniels office.  Assessment:  #1. 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 She underwent cholecystectomy in December 2012 and diuresed and was removed in May 2013. She has very large tortuous bile duct at risk for stasis and on her last ERCP of May 2013 she had multiple common duct stones removed as well.. She has no biliary symptoms. Recent LFTs reportedly normal. #2. Chronic constipation. She needs to be on treatment on schedule rather than on when necessary basis   Plan:  Will request copy of recent blood work from Dr. Almond Lintterry Daniels office. Colace 20 mg by mouth each bedtime. Polyethylene glycol 8.5 g or less by mouth daily. She can titrate the dose. Office visit in one year unless she has symptoms.

## 2014-02-11 NOTE — Patient Instructions (Signed)
Can take polyethylene glycol daily at reduced dose as discussed. Call if nausea spells occur more frequently

## 2014-02-19 DIAGNOSIS — L110 Acquired keratosis follicularis: Secondary | ICD-10-CM | POA: Diagnosis not present

## 2014-02-19 DIAGNOSIS — E1140 Type 2 diabetes mellitus with diabetic neuropathy, unspecified: Secondary | ICD-10-CM | POA: Diagnosis not present

## 2014-02-19 DIAGNOSIS — E1142 Type 2 diabetes mellitus with diabetic polyneuropathy: Secondary | ICD-10-CM | POA: Diagnosis not present

## 2014-03-31 ENCOUNTER — Encounter (INDEPENDENT_AMBULATORY_CARE_PROVIDER_SITE_OTHER): Payer: Self-pay

## 2014-04-02 DIAGNOSIS — R5383 Other fatigue: Secondary | ICD-10-CM | POA: Diagnosis not present

## 2014-04-02 DIAGNOSIS — E1165 Type 2 diabetes mellitus with hyperglycemia: Secondary | ICD-10-CM | POA: Diagnosis not present

## 2014-04-02 DIAGNOSIS — E782 Mixed hyperlipidemia: Secondary | ICD-10-CM | POA: Diagnosis not present

## 2014-04-02 DIAGNOSIS — E039 Hypothyroidism, unspecified: Secondary | ICD-10-CM | POA: Diagnosis not present

## 2014-04-09 DIAGNOSIS — E782 Mixed hyperlipidemia: Secondary | ICD-10-CM | POA: Diagnosis not present

## 2014-04-09 DIAGNOSIS — Z1389 Encounter for screening for other disorder: Secondary | ICD-10-CM | POA: Diagnosis not present

## 2014-04-09 DIAGNOSIS — E039 Hypothyroidism, unspecified: Secondary | ICD-10-CM | POA: Diagnosis not present

## 2014-04-09 DIAGNOSIS — M545 Low back pain: Secondary | ICD-10-CM | POA: Diagnosis not present

## 2014-04-09 DIAGNOSIS — Z9189 Other specified personal risk factors, not elsewhere classified: Secondary | ICD-10-CM | POA: Diagnosis not present

## 2014-04-09 DIAGNOSIS — E1165 Type 2 diabetes mellitus with hyperglycemia: Secondary | ICD-10-CM | POA: Diagnosis not present

## 2014-07-01 DIAGNOSIS — R0789 Other chest pain: Secondary | ICD-10-CM | POA: Diagnosis not present

## 2014-07-04 DIAGNOSIS — R52 Pain, unspecified: Secondary | ICD-10-CM | POA: Diagnosis not present

## 2014-07-04 DIAGNOSIS — M25562 Pain in left knee: Secondary | ICD-10-CM | POA: Diagnosis not present

## 2014-07-05 ENCOUNTER — Encounter (HOSPITAL_COMMUNITY)
Admission: EM | Disposition: A | Discharge status: Skilled Nursing Facility | Payer: Self-pay | Source: Other Acute Inpatient Hospital | Attending: Internal Medicine

## 2014-07-05 ENCOUNTER — Inpatient Hospital Stay (HOSPITAL_COMMUNITY)
Admission: EM | Admit: 2014-07-05 | Discharge: 2014-07-08 | DRG: 480 | Disposition: A | Discharge status: Skilled Nursing Facility | Payer: Medicare Other | Source: Other Acute Inpatient Hospital | Attending: Internal Medicine | Admitting: Internal Medicine

## 2014-07-05 ENCOUNTER — Inpatient Hospital Stay (HOSPITAL_COMMUNITY): Payer: Medicare Other

## 2014-07-05 ENCOUNTER — Inpatient Hospital Stay (HOSPITAL_COMMUNITY): Payer: Medicare Other | Admitting: Certified Registered Nurse Anesthetist

## 2014-07-05 ENCOUNTER — Encounter (HOSPITAL_COMMUNITY): Payer: Self-pay | Admitting: Certified Registered Nurse Anesthetist

## 2014-07-05 DIAGNOSIS — E039 Hypothyroidism, unspecified: Secondary | ICD-10-CM | POA: Diagnosis present

## 2014-07-05 DIAGNOSIS — Z01818 Encounter for other preprocedural examination: Secondary | ICD-10-CM | POA: Diagnosis not present

## 2014-07-05 DIAGNOSIS — M1990 Unspecified osteoarthritis, unspecified site: Secondary | ICD-10-CM | POA: Diagnosis not present

## 2014-07-05 DIAGNOSIS — R630 Anorexia: Secondary | ICD-10-CM | POA: Diagnosis present

## 2014-07-05 DIAGNOSIS — K5900 Constipation, unspecified: Secondary | ICD-10-CM | POA: Diagnosis present

## 2014-07-05 DIAGNOSIS — Z419 Encounter for procedure for purposes other than remedying health state, unspecified: Secondary | ICD-10-CM

## 2014-07-05 DIAGNOSIS — R030 Elevated blood-pressure reading, without diagnosis of hypertension: Secondary | ICD-10-CM | POA: Diagnosis not present

## 2014-07-05 DIAGNOSIS — K219 Gastro-esophageal reflux disease without esophagitis: Secondary | ICD-10-CM | POA: Diagnosis present

## 2014-07-05 DIAGNOSIS — W1830XA Fall on same level, unspecified, initial encounter: Secondary | ICD-10-CM | POA: Diagnosis present

## 2014-07-05 DIAGNOSIS — M810 Age-related osteoporosis without current pathological fracture: Secondary | ICD-10-CM | POA: Diagnosis not present

## 2014-07-05 DIAGNOSIS — M80052A Age-related osteoporosis with current pathological fracture, left femur, initial encounter for fracture: Secondary | ICD-10-CM | POA: Diagnosis not present

## 2014-07-05 DIAGNOSIS — K831 Obstruction of bile duct: Secondary | ICD-10-CM | POA: Diagnosis not present

## 2014-07-05 DIAGNOSIS — S72352A Displaced comminuted fracture of shaft of left femur, initial encounter for closed fracture: Secondary | ICD-10-CM | POA: Diagnosis not present

## 2014-07-05 DIAGNOSIS — S72452A Displaced supracondylar fracture without intracondylar extension of lower end of left femur, initial encounter for closed fracture: Secondary | ICD-10-CM | POA: Diagnosis not present

## 2014-07-05 DIAGNOSIS — S7292XA Unspecified fracture of left femur, initial encounter for closed fracture: Secondary | ICD-10-CM | POA: Diagnosis not present

## 2014-07-05 DIAGNOSIS — R278 Other lack of coordination: Secondary | ICD-10-CM | POA: Diagnosis not present

## 2014-07-05 DIAGNOSIS — F419 Anxiety disorder, unspecified: Secondary | ICD-10-CM | POA: Diagnosis not present

## 2014-07-05 DIAGNOSIS — S72402A Unspecified fracture of lower end of left femur, initial encounter for closed fracture: Secondary | ICD-10-CM | POA: Diagnosis present

## 2014-07-05 DIAGNOSIS — D869 Sarcoidosis, unspecified: Secondary | ICD-10-CM | POA: Diagnosis not present

## 2014-07-05 DIAGNOSIS — I5032 Chronic diastolic (congestive) heart failure: Secondary | ICD-10-CM | POA: Diagnosis present

## 2014-07-05 DIAGNOSIS — S72452B Displaced supracondylar fracture without intracondylar extension of lower end of left femur, initial encounter for open fracture type I or II: Secondary | ICD-10-CM | POA: Diagnosis not present

## 2014-07-05 DIAGNOSIS — Z9181 History of falling: Secondary | ICD-10-CM

## 2014-07-05 DIAGNOSIS — D62 Acute posthemorrhagic anemia: Secondary | ICD-10-CM | POA: Diagnosis not present

## 2014-07-05 DIAGNOSIS — E038 Other specified hypothyroidism: Secondary | ICD-10-CM | POA: Diagnosis not present

## 2014-07-05 DIAGNOSIS — S72002A Fracture of unspecified part of neck of left femur, initial encounter for closed fracture: Secondary | ICD-10-CM | POA: Diagnosis not present

## 2014-07-05 DIAGNOSIS — M6281 Muscle weakness (generalized): Secondary | ICD-10-CM | POA: Diagnosis not present

## 2014-07-05 DIAGNOSIS — Z79899 Other long term (current) drug therapy: Secondary | ICD-10-CM | POA: Diagnosis not present

## 2014-07-05 DIAGNOSIS — R636 Underweight: Secondary | ICD-10-CM | POA: Diagnosis present

## 2014-07-05 DIAGNOSIS — E1165 Type 2 diabetes mellitus with hyperglycemia: Secondary | ICD-10-CM | POA: Diagnosis not present

## 2014-07-05 DIAGNOSIS — R64 Cachexia: Secondary | ICD-10-CM | POA: Diagnosis present

## 2014-07-05 DIAGNOSIS — R262 Difficulty in walking, not elsewhere classified: Secondary | ICD-10-CM | POA: Diagnosis not present

## 2014-07-05 DIAGNOSIS — M79605 Pain in left leg: Secondary | ICD-10-CM | POA: Diagnosis not present

## 2014-07-05 DIAGNOSIS — Z4789 Encounter for other orthopedic aftercare: Secondary | ICD-10-CM | POA: Diagnosis not present

## 2014-07-05 DIAGNOSIS — E119 Type 2 diabetes mellitus without complications: Secondary | ICD-10-CM | POA: Diagnosis not present

## 2014-07-05 DIAGNOSIS — I509 Heart failure, unspecified: Secondary | ICD-10-CM | POA: Diagnosis not present

## 2014-07-05 DIAGNOSIS — E871 Hypo-osmolality and hyponatremia: Secondary | ICD-10-CM | POA: Diagnosis not present

## 2014-07-05 DIAGNOSIS — W133XXA Fall through floor, initial encounter: Secondary | ICD-10-CM | POA: Diagnosis not present

## 2014-07-05 HISTORY — PX: ORIF FEMUR FRACTURE: SHX2119

## 2014-07-05 LAB — CBC
HEMATOCRIT: 27.2 % — AB (ref 36.0–46.0)
HEMOGLOBIN: 9.1 g/dL — AB (ref 12.0–15.0)
MCH: 31 pg (ref 26.0–34.0)
MCHC: 33.5 g/dL (ref 30.0–36.0)
MCV: 92.5 fL (ref 78.0–100.0)
PLATELETS: 188 10*3/uL (ref 150–400)
RBC: 2.94 MIL/uL — ABNORMAL LOW (ref 3.87–5.11)
RDW: 12.7 % (ref 11.5–15.5)
WBC: 10.4 10*3/uL (ref 4.0–10.5)

## 2014-07-05 LAB — ABO/RH: ABO/RH(D): O POS

## 2014-07-05 LAB — GLUCOSE, CAPILLARY
GLUCOSE-CAPILLARY: 184 mg/dL — AB (ref 70–99)
GLUCOSE-CAPILLARY: 257 mg/dL — AB (ref 70–99)
GLUCOSE-CAPILLARY: 223 mg/dL — AB (ref 70–99)
Glucose-Capillary: 248 mg/dL — ABNORMAL HIGH (ref 70–99)
Glucose-Capillary: 275 mg/dL — ABNORMAL HIGH (ref 70–99)

## 2014-07-05 LAB — OSMOLALITY: OSMOLALITY: 289 mosm/kg (ref 275–300)

## 2014-07-05 LAB — ALBUMIN: Albumin: 3.8 g/dL (ref 3.5–5.2)

## 2014-07-05 LAB — CREATININE, SERUM
CREATININE: 0.89 mg/dL (ref 0.50–1.10)
GFR calc Af Amer: 68 mL/min — ABNORMAL LOW (ref >90)
GFR calc non Af Amer: 59 mL/min — ABNORMAL LOW (ref >90)

## 2014-07-05 LAB — SURGICAL PCR SCREEN
MRSA, PCR: NEGATIVE
Staphylococcus aureus: NEGATIVE

## 2014-07-05 LAB — CALCIUM: Calcium: 8.6 mg/dL (ref 8.4–10.5)

## 2014-07-05 LAB — TSH: TSH: 0.915 u[IU]/mL (ref 0.350–4.500)

## 2014-07-05 SURGERY — OPEN REDUCTION INTERNAL FIXATION (ORIF) DISTAL FEMUR FRACTURE
Anesthesia: Regional | Laterality: Left

## 2014-07-05 SURGERY — OPEN REDUCTION INTERNAL FIXATION (ORIF) DISTAL FEMUR FRACTURE
Anesthesia: General | Site: Leg Upper | Laterality: Left

## 2014-07-05 MED ORDER — GLUCERNA SHAKE PO LIQD
237.0000 mL | Freq: Two times a day (BID) | ORAL | Status: DC
  Administered 2014-07-06 – 2014-07-08 (×4): 237 mL via ORAL

## 2014-07-05 MED ORDER — ONDANSETRON HCL 4 MG/2ML IJ SOLN: 4.0000 mg | Freq: Four times a day (QID) | INTRAMUSCULAR | Status: DC | PRN

## 2014-07-05 MED ORDER — LIDOCAINE HCL 4 % MT SOLN
OROMUCOSAL | Status: DC | PRN
Start: 2014-07-05 — End: 2014-07-05
  Administered 2014-07-05: 3 mL via TOPICAL

## 2014-07-05 MED ORDER — HYDROCODONE-ACETAMINOPHEN 5-325 MG PO TABS: 1.0000 | ORAL_TABLET | Freq: Four times a day (QID) | ORAL | Status: DC | PRN

## 2014-07-05 MED ORDER — MORPHINE SULFATE 2 MG/ML IJ SOLN
0.5000 mg | INTRAMUSCULAR | Status: DC | PRN
  Filled 2014-07-05: qty 1

## 2014-07-05 MED ORDER — DIAZEPAM 5 MG/ML IJ SOLN: 2.5000 mg | Freq: Three times a day (TID) | INTRAMUSCULAR | Status: DC | PRN

## 2014-07-05 MED ORDER — METHOCARBAMOL 1000 MG/10ML IJ SOLN
500.0000 mg | Freq: Four times a day (QID) | INTRAVENOUS | Status: DC | PRN
  Filled 2014-07-05: qty 5

## 2014-07-05 MED ORDER — ONDANSETRON HCL 4 MG/2ML IJ SOLN
INTRAMUSCULAR | Status: DC | PRN
  Administered 2014-07-05: 4 mg via INTRAVENOUS

## 2014-07-05 MED ORDER — ARTIFICIAL TEARS OP OINT
TOPICAL_OINTMENT | OPHTHALMIC | Status: DC | PRN
  Administered 2014-07-05: 1 via OPHTHALMIC

## 2014-07-05 MED ORDER — FENTANYL CITRATE 0.05 MG/ML IJ SOLN
INTRAMUSCULAR | Status: DC | PRN
  Administered 2014-07-05: 25 ug via INTRAVENOUS
  Administered 2014-07-05: 50 ug via INTRAVENOUS
  Administered 2014-07-05: 100 ug via INTRAVENOUS

## 2014-07-05 MED ORDER — FENTANYL CITRATE 0.05 MG/ML IJ SOLN: 25.0000 ug | INTRAMUSCULAR | Status: DC | PRN

## 2014-07-05 MED ORDER — PROPOFOL 10 MG/ML IV BOLUS
INTRAVENOUS | Status: DC | PRN
  Administered 2014-07-05: 60 mg via INTRAVENOUS
  Administered 2014-07-05 (×2): 20 mg via INTRAVENOUS

## 2014-07-05 MED ORDER — BUPIVACAINE-EPINEPHRINE 0.5% -1:200000 IJ SOLN
INTRAMUSCULAR | Status: DC | PRN
  Administered 2014-07-05: 20 mL

## 2014-07-05 MED ORDER — SODIUM CHLORIDE 0.45 % IV SOLN
INTRAVENOUS | Status: DC
  Administered 2014-07-05: 15:00:00 via INTRAVENOUS

## 2014-07-05 MED ORDER — GLYCOPYRROLATE 0.2 MG/ML IJ SOLN
INTRAMUSCULAR | Status: DC | PRN
  Administered 2014-07-05: 0.2 mg via INTRAVENOUS

## 2014-07-05 MED ORDER — FLEET ENEMA 7-19 GM/118ML RE ENEM: 1.0000 | ENEMA | Freq: Once | RECTAL | Status: AC | PRN

## 2014-07-05 MED ORDER — DOCUSATE SODIUM 100 MG PO CAPS: 100.0000 mg | ORAL_CAPSULE | Freq: Two times a day (BID) | ORAL | Status: DC

## 2014-07-05 MED ORDER — ACETAMINOPHEN 650 MG RE SUPP: 650.0000 mg | Freq: Four times a day (QID) | RECTAL | Status: DC | PRN

## 2014-07-05 MED ORDER — ADULT MULTIVITAMIN W/MINERALS CH
1.0000 | ORAL_TABLET | Freq: Every day | ORAL | Status: DC
  Administered 2014-07-06 – 2014-07-08 (×3): 1 via ORAL
  Filled 2014-07-05 (×3): qty 1

## 2014-07-05 MED ORDER — POLYETHYLENE GLYCOL 3350 17 G PO PACK: 17.0000 g | PACK | Freq: Every day | ORAL | Status: DC | PRN

## 2014-07-05 MED ORDER — ENOXAPARIN SODIUM 40 MG/0.4ML ~~LOC~~ SOLN
40.0000 mg | SUBCUTANEOUS | Status: DC
  Administered 2014-07-06 – 2014-07-08 (×3): 40 mg via SUBCUTANEOUS
  Filled 2014-07-05 (×3): qty 0.4

## 2014-07-05 MED ORDER — BISACODYL 10 MG RE SUPP: 10.0000 mg | Freq: Every day | RECTAL | Status: DC | PRN

## 2014-07-05 MED ORDER — CALCIUM CHLORIDE 10 % IV SOLN
INTRAVENOUS | Status: DC | PRN
  Administered 2014-07-05 (×3): 200 mg via INTRAVENOUS

## 2014-07-05 MED ORDER — ROCURONIUM BROMIDE 100 MG/10ML IV SOLN
INTRAVENOUS | Status: DC | PRN
  Administered 2014-07-05: 30 mg via INTRAVENOUS

## 2014-07-05 MED ORDER — ALBUMIN HUMAN 5 % IV SOLN
INTRAVENOUS | Status: DC | PRN
  Administered 2014-07-05: 11:00:00 via INTRAVENOUS

## 2014-07-05 MED ORDER — FERROUS SULFATE 325 (65 FE) MG PO TABS
325.0000 mg | ORAL_TABLET | Freq: Three times a day (TID) | ORAL | Status: DC
Start: 2014-07-05 — End: 2014-07-08
  Administered 2014-07-05 – 2014-07-08 (×9): 325 mg via ORAL
  Filled 2014-07-05 (×9): qty 1

## 2014-07-05 MED ORDER — DEXAMETHASONE SODIUM PHOSPHATE 4 MG/ML IJ SOLN
INTRAMUSCULAR | Status: DC | PRN
  Administered 2014-07-05: 4 mg via INTRAVENOUS

## 2014-07-05 MED ORDER — MORPHINE SULFATE 2 MG/ML IJ SOLN
0.5000 mg | INTRAMUSCULAR | Status: DC | PRN
  Administered 2014-07-05: 0.5 mg via INTRAVENOUS
  Filled 2014-07-05: qty 1

## 2014-07-05 MED ORDER — PANTOPRAZOLE SODIUM 40 MG IV SOLR
40.0000 mg | INTRAVENOUS | Status: DC
  Administered 2014-07-06: 40 mg via INTRAVENOUS
  Filled 2014-07-05 (×3): qty 40

## 2014-07-05 MED ORDER — POTASSIUM CHLORIDE IN NACL 20-0.9 MEQ/L-% IV SOLN
INTRAVENOUS | Status: DC
  Filled 2014-07-05 (×2): qty 1000

## 2014-07-05 MED ORDER — ONDANSETRON HCL 4 MG PO TABS: 4.0000 mg | ORAL_TABLET | Freq: Four times a day (QID) | ORAL | Status: DC | PRN

## 2014-07-05 MED ORDER — ONDANSETRON HCL 4 MG/2ML IJ SOLN
4.0000 mg | Freq: Four times a day (QID) | INTRAMUSCULAR | Status: DC | PRN
  Administered 2014-07-05: 4 mg via INTRAVENOUS
  Filled 2014-07-05: qty 2

## 2014-07-05 MED ORDER — LIDOCAINE HCL (CARDIAC) 20 MG/ML IV SOLN
INTRAVENOUS | Status: DC | PRN
  Administered 2014-07-05: 40 mg via INTRAVENOUS

## 2014-07-05 MED ORDER — DOCUSATE SODIUM 100 MG PO CAPS
100.0000 mg | ORAL_CAPSULE | Freq: Two times a day (BID) | ORAL | Status: DC
Start: 2014-07-05 — End: 2014-07-07
  Administered 2014-07-05 – 2014-07-07 (×4): 100 mg via ORAL
  Filled 2014-07-05 (×4): qty 1

## 2014-07-05 MED ORDER — MIDAZOLAM HCL 2 MG/2ML IJ SOLN
INTRAMUSCULAR | Status: AC
  Filled 2014-07-05: qty 2

## 2014-07-05 MED ORDER — CEFAZOLIN SODIUM-DEXTROSE 2-3 GM-% IV SOLR
INTRAVENOUS | Status: DC | PRN
  Administered 2014-07-05: 2 g via INTRAVENOUS

## 2014-07-05 MED ORDER — LACTATED RINGERS IV SOLN
INTRAVENOUS | Status: DC | PRN
  Administered 2014-07-05 (×2): via INTRAVENOUS

## 2014-07-05 MED ORDER — MENTHOL 3 MG MT LOZG: 1.0000 | LOZENGE | OROMUCOSAL | Status: DC | PRN

## 2014-07-05 MED ORDER — METOCLOPRAMIDE HCL 10 MG PO TABS: 5.0000 mg | ORAL_TABLET | Freq: Three times a day (TID) | ORAL | Status: DC | PRN

## 2014-07-05 MED ORDER — INSULIN ASPART 100 UNIT/ML ~~LOC~~ SOLN
0.0000 [IU] | SUBCUTANEOUS | Status: DC
  Administered 2014-07-05 (×2): 3 [IU] via SUBCUTANEOUS
  Administered 2014-07-05: 5 [IU] via SUBCUTANEOUS
  Administered 2014-07-06: 2 [IU] via SUBCUTANEOUS
  Administered 2014-07-06: 1 [IU] via SUBCUTANEOUS

## 2014-07-05 MED ORDER — PRO-STAT SUGAR FREE PO LIQD
30.0000 mL | Freq: Every day | ORAL | Status: DC
  Administered 2014-07-06 – 2014-07-07 (×2): 30 mL via ORAL
  Filled 2014-07-05 (×3): qty 30

## 2014-07-05 MED ORDER — PHENOL 1.4 % MT LIQD: 1.0000 | OROMUCOSAL | Status: DC | PRN

## 2014-07-05 MED ORDER — ACETAMINOPHEN 325 MG PO TABS: 650.0000 mg | ORAL_TABLET | Freq: Four times a day (QID) | ORAL | Status: DC | PRN

## 2014-07-05 MED ORDER — 0.9 % SODIUM CHLORIDE (POUR BTL) OPTIME
TOPICAL | Status: DC | PRN
  Administered 2014-07-05: 1000 mL

## 2014-07-05 MED ORDER — METOCLOPRAMIDE HCL 5 MG/ML IJ SOLN
5.0000 mg | Freq: Three times a day (TID) | INTRAMUSCULAR | Status: DC | PRN
  Administered 2014-07-05: 10 mg via INTRAVENOUS
  Filled 2014-07-05: qty 2

## 2014-07-05 MED ORDER — METHOCARBAMOL 500 MG PO TABS
500.0000 mg | ORAL_TABLET | Freq: Four times a day (QID) | ORAL | Status: DC | PRN
  Administered 2014-07-05: 500 mg via ORAL
  Filled 2014-07-05: qty 1

## 2014-07-05 MED ORDER — LEVOTHYROXINE SODIUM 100 MCG IV SOLR
75.0000 ug | Freq: Every day | INTRAVENOUS | Status: DC
Start: 2014-07-05 — End: 2014-07-06
  Administered 2014-07-06: 75 ug via INTRAVENOUS
  Filled 2014-07-05 (×2): qty 5

## 2014-07-05 MED ORDER — PROPOFOL 10 MG/ML IV BOLUS
INTRAVENOUS | Status: AC
  Filled 2014-07-05: qty 20

## 2014-07-05 MED ORDER — HYDROCODONE-ACETAMINOPHEN 5-325 MG PO TABS
1.0000 | ORAL_TABLET | Freq: Four times a day (QID) | ORAL | Status: DC | PRN
  Administered 2014-07-05: 1 via ORAL
  Filled 2014-07-05: qty 1

## 2014-07-05 MED ORDER — POLYETHYLENE GLYCOL 3350 17 G PO PACK
17.0000 g | PACK | Freq: Every day | ORAL | Status: DC | PRN
  Administered 2014-07-07: 17 g via ORAL
  Filled 2014-07-05: qty 1

## 2014-07-05 MED ORDER — FENTANYL CITRATE 0.05 MG/ML IJ SOLN
INTRAMUSCULAR | Status: AC
  Filled 2014-07-05: qty 5

## 2014-07-05 MED ORDER — NEOSTIGMINE METHYLSULFATE 10 MG/10ML IV SOLN
INTRAVENOUS | Status: DC | PRN
  Administered 2014-07-05: 2 mg via INTRAVENOUS

## 2014-07-05 MED ORDER — PHENYLEPHRINE HCL 10 MG/ML IJ SOLN
INTRAMUSCULAR | Status: DC | PRN
  Administered 2014-07-05: 120 ug via INTRAVENOUS
  Administered 2014-07-05: 80 ug via INTRAVENOUS
  Administered 2014-07-05: 200 ug via INTRAVENOUS

## 2014-07-05 SURGICAL SUPPLY — 58 items
BANDAGE ELASTIC 6 VELCRO ST LF (GAUZE/BANDAGES/DRESSINGS) ×3 IMPLANT
BIT DRILL 4.8MMDIAX5IN DISPOSE (BIT) ×1 IMPLANT
BNDG GAUZE ELAST 4 BULKY (GAUZE/BANDAGES/DRESSINGS) ×3 IMPLANT
CUFF TOURNIQUET SINGLE 34IN LL (TOURNIQUET CUFF) ×3 IMPLANT
CUFF TOURNIQUET SINGLE 44IN (TOURNIQUET CUFF) IMPLANT
DRAPE IMP U-DRAPE 54X76 (DRAPES) ×3 IMPLANT
DRAPE STERI IOBAN 125X83 (DRAPES) ×3 IMPLANT
DRAPE SURG 17X23 STRL (DRAPES) ×3 IMPLANT
DRILL BIT 4.8MMDIAX5IN DISPOSE (BIT) ×3
DRSG ADAPTIC 3X8 NADH LF (GAUZE/BANDAGES/DRESSINGS) ×3 IMPLANT
DRSG PAD ABDOMINAL 8X10 ST (GAUZE/BANDAGES/DRESSINGS) ×3 IMPLANT
DURAPREP 26ML APPLICATOR (WOUND CARE) ×3 IMPLANT
ELECT CAUTERY BLADE 6.4 (BLADE) ×3 IMPLANT
ELECT REM PT RETURN 9FT ADLT (ELECTROSURGICAL) ×3
ELECTRODE REM PT RTRN 9FT ADLT (ELECTROSURGICAL) ×1 IMPLANT
EVACUATOR 1/8 PVC DRAIN (DRAIN) IMPLANT
GAUZE SPONGE 4X4 12PLY STRL (GAUZE/BANDAGES/DRESSINGS) ×3 IMPLANT
GLOVE BIOGEL PI IND STRL 7.5 (GLOVE) ×1 IMPLANT
GLOVE BIOGEL PI IND STRL 8 (GLOVE) ×1 IMPLANT
GLOVE BIOGEL PI INDICATOR 7.5 (GLOVE) ×2
GLOVE BIOGEL PI INDICATOR 8 (GLOVE) ×2
GLOVE ECLIPSE 7.0 STRL STRAW (GLOVE) ×3 IMPLANT
GLOVE ORTHO TXT STRL SZ7.5 (GLOVE) ×3 IMPLANT
GOWN STRL REUS W/ TWL LRG LVL3 (GOWN DISPOSABLE) ×3 IMPLANT
GOWN STRL REUS W/TWL LRG LVL3 (GOWN DISPOSABLE) ×6
IMMOBILIZER KNEE 20 (SOFTGOODS) ×3
IMMOBILIZER KNEE 20 THIGH 36 (SOFTGOODS) ×1 IMPLANT
K-WIRE ACE 1.6X6 (WIRE) ×6
KIT BASIN OR (CUSTOM PROCEDURE TRAY) ×3 IMPLANT
KIT ROOM TURNOVER OR (KITS) ×3 IMPLANT
KWIRE ACE 1.6X6 (WIRE) ×2 IMPLANT
MANIFOLD NEPTUNE II (INSTRUMENTS) ×3 IMPLANT
NS IRRIG 1000ML POUR BTL (IV SOLUTION) ×3 IMPLANT
PACK GENERAL/GYN (CUSTOM PROCEDURE TRAY) ×3 IMPLANT
PACK UNIVERSAL I (CUSTOM PROCEDURE TRAY) ×3 IMPLANT
PAD ARMBOARD 7.5X6 YLW CONV (MISCELLANEOUS) ×9 IMPLANT
PADDING CAST ABS 6INX4YD NS (CAST SUPPLIES) ×2
PADDING CAST ABS COTTON 6X4 NS (CAST SUPPLIES) ×1 IMPLANT
PLATE FEMORAL LOCK 6 HOLE LT (Plate) ×3 IMPLANT
SCREW LOCK DIST FEM 5.5X60 (Screw) ×3 IMPLANT
SCREW LOCK DIST FEM 5.5X80 (Screw) ×3 IMPLANT
SCREW LOCK PLY PROX TIB 8X80 (Screw) ×3 IMPLANT
SCREW LOCK PLY TIB 5.5X75 (Screw) ×3 IMPLANT
SCREW NLOCK CORT 4.5X32 (Screw) ×3 IMPLANT
SCREW NLOCK CORT 4.5X34 (Screw) ×6 IMPLANT
SCREW NLOCK CORT STAR 4.5X36 (Screw) ×9 IMPLANT
SCREW NLOCK DIST FEM 5.5X65 (Screw) ×3 IMPLANT
SCREW NLOCK DIST FEM 5.5X75 (Screw) ×3 IMPLANT
STAPLER VISISTAT 35W (STAPLE) ×3 IMPLANT
SUT VIC AB 0 CT1 27 (SUTURE) ×4
SUT VIC AB 0 CT1 27XBRD ANBCTR (SUTURE) ×2 IMPLANT
SUT VIC AB 1 CT1 27 (SUTURE)
SUT VIC AB 1 CT1 27XBRD ANBCTR (SUTURE) IMPLANT
SUT VIC AB 2-0 CT1 27 (SUTURE) ×2
SUT VIC AB 2-0 CT1 TAPERPNT 27 (SUTURE) ×1 IMPLANT
TOWEL OR 17X24 6PK STRL BLUE (TOWEL DISPOSABLE) ×3 IMPLANT
TOWEL OR 17X26 10 PK STRL BLUE (TOWEL DISPOSABLE) ×3 IMPLANT
WATER STERILE IRR 1000ML POUR (IV SOLUTION) ×3 IMPLANT

## 2014-07-05 SURGICAL SUPPLY — 40 items
BIT DRILL 4.8MMDIAX5IN DISPOSE (BIT) ×1 IMPLANT
CUFF TOURNIQUET SINGLE 34IN LL (TOURNIQUET CUFF) IMPLANT
CUFF TOURNIQUET SINGLE 44IN (TOURNIQUET CUFF) IMPLANT
DRAPE IMP U-DRAPE 54X76 (DRAPES) ×3 IMPLANT
DRAPE STERI IOBAN 125X83 (DRAPES) ×3 IMPLANT
DRAPE SURG 17X23 STRL (DRAPES) ×3 IMPLANT
DRILL BIT 4.8MMDIAX5IN DISPOSE (BIT) ×3
DRSG ADAPTIC 3X8 NADH LF (GAUZE/BANDAGES/DRESSINGS) ×3 IMPLANT
DRSG PAD ABDOMINAL 8X10 ST (GAUZE/BANDAGES/DRESSINGS) ×3 IMPLANT
DURAPREP 26ML APPLICATOR (WOUND CARE) ×3 IMPLANT
ELECT CAUTERY BLADE 6.4 (BLADE) ×3 IMPLANT
ELECT REM PT RETURN 9FT ADLT (ELECTROSURGICAL) ×3
ELECTRODE REM PT RTRN 9FT ADLT (ELECTROSURGICAL) ×1 IMPLANT
EVACUATOR 1/8 PVC DRAIN (DRAIN) IMPLANT
GAUZE SPONGE 4X4 12PLY STRL (GAUZE/BANDAGES/DRESSINGS) ×3 IMPLANT
GLOVE BIOGEL PI IND STRL 7.5 (GLOVE) ×1 IMPLANT
GLOVE BIOGEL PI IND STRL 8 (GLOVE) ×1 IMPLANT
GLOVE BIOGEL PI INDICATOR 7.5 (GLOVE) ×2
GLOVE BIOGEL PI INDICATOR 8 (GLOVE) ×2
GLOVE ECLIPSE 7.0 STRL STRAW (GLOVE) ×3 IMPLANT
GLOVE ORTHO TXT STRL SZ7.5 (GLOVE) ×3 IMPLANT
GOWN STRL REUS W/ TWL LRG LVL3 (GOWN DISPOSABLE) ×3 IMPLANT
GOWN STRL REUS W/TWL LRG LVL3 (GOWN DISPOSABLE) ×6
KIT BASIN OR (CUSTOM PROCEDURE TRAY) ×3 IMPLANT
KIT ROOM TURNOVER OR (KITS) ×3 IMPLANT
MANIFOLD NEPTUNE II (INSTRUMENTS) ×3 IMPLANT
NS IRRIG 1000ML POUR BTL (IV SOLUTION) ×3 IMPLANT
PACK GENERAL/GYN (CUSTOM PROCEDURE TRAY) ×3 IMPLANT
PACK UNIVERSAL I (CUSTOM PROCEDURE TRAY) ×3 IMPLANT
PAD ARMBOARD 7.5X6 YLW CONV (MISCELLANEOUS) ×6 IMPLANT
STAPLER VISISTAT 35W (STAPLE) ×3 IMPLANT
SUT VIC AB 0 CT1 27 (SUTURE) ×6
SUT VIC AB 0 CT1 27XBRD ANBCTR (SUTURE) ×3 IMPLANT
SUT VIC AB 1 CT1 27 (SUTURE) ×2
SUT VIC AB 1 CT1 27XBRD ANBCTR (SUTURE) ×1 IMPLANT
SUT VIC AB 2-0 CT1 27 (SUTURE) ×2
SUT VIC AB 2-0 CT1 TAPERPNT 27 (SUTURE) ×1 IMPLANT
TOWEL OR 17X24 6PK STRL BLUE (TOWEL DISPOSABLE) ×3 IMPLANT
TOWEL OR 17X26 10 PK STRL BLUE (TOWEL DISPOSABLE) ×3 IMPLANT
WATER STERILE IRR 1000ML POUR (IV SOLUTION) ×3 IMPLANT

## 2014-07-05 NOTE — Anesthesia Preprocedure Evaluation (Signed)
Anesthesia Evaluation  Patient identified by MRN, date of birth, ID band Patient awake    Reviewed: Allergy & Precautions, H&P , NPO status , Patient's Chart, lab work & pertinent test results  Airway Mallampati: II  TM Distance: >3 FB Neck ROM: Full    Dental no notable dental hx. (+) Edentulous Upper, Edentulous Lower, Dental Advisory Given   Pulmonary neg pulmonary ROS,  breath sounds clear to auscultation  Pulmonary exam normal       Cardiovascular +CHF negative cardio ROS  Rhythm:Regular Rate:Normal     Neuro/Psych negative neurological ROS  negative psych ROS   GI/Hepatic Neg liver ROS, GERD-  Medicated,  Endo/Other  diabetes, Type 2, Oral Hypoglycemic AgentsHypothyroidism   Renal/GU negative Renal ROS  negative genitourinary   Musculoskeletal   Abdominal   Peds  Hematology negative hematology ROS (+)   Anesthesia Other Findings   Reproductive/Obstetrics negative OB ROS                             Anesthesia Physical Anesthesia Plan  ASA: II  Anesthesia Plan: General   Post-op Pain Management:    Induction: Intravenous  Airway Management Planned: Oral ETT  Additional Equipment:   Intra-op Plan:   Post-operative Plan: Extubation in OR  Informed Consent: I have reviewed the patients History and Physical, chart, labs and discussed the procedure including the risks, benefits and alternatives for the proposed anesthesia with the patient or authorized representative who has indicated his/her understanding and acceptance.   Dental advisory given  Plan Discussed with: CRNA  Anesthesia Plan Comments:         Anesthesia Quick Evaluation

## 2014-07-05 NOTE — Progress Notes (Signed)
INITIAL NUTRITION ASSESSMENT  DOCUMENTATION CODES Per approved criteria  -Not Applicable   INTERVENTION: Diet advancement per MD Add Glucerna Shakes BID when diet advanced, each supplement provides 220 kcal and 10 grams of protein Provide Pro-stat once daily, provides 100 kcal and 15 grams of protein Provide Multivitamin with minerals daily RD to monitor for PO adequacy   NUTRITION DIAGNOSIS: Predicted sub optimal energy inatake related to scheduled surgery and advanced age as evidenced by pt's chart and NPO status.   Goal: Pt to meet >/= 90% of their estimated nutrition needs   Monitor:  Diet advancement, PO intake, supplement acceptance, weight trend, labs  Reason for Assessment: Consult for nutrition assessment  81 y.o. female  Admitting Dx: Fracture of femur, distal, left, closed  ASSESSMENT: 81 year old woman with history of diabetes, anorexia, weight loss, GERD, and hip fracture in the past who presented after a fall 3/4 on her back. [facility] she was diagnosed with a distal comminuted femur fracture left side. [facility].  Pt out of room for surgery at time of visit. No weight on file. Pt NPO for surgery. In October, pt was at 91% of her ideal body weight. Per MD note, pt appears frail.  Labs: glucose ranging 248-275 mg/dL   Height: Ht Readings from Last 1 Encounters:  02/11/14 4\' 7"  (1.397 m)    Weight: Wt Readings from Last 1 Encounters:  02/11/14 84 lb (38.102 kg)    Ideal Body Weight: 92 lbs  % Ideal Body Weight: 91%  Wt Readings from Last 10 Encounters:  02/11/14 84 lb (38.102 kg)  11/28/12 77 lb 4.8 oz (35.063 kg)  11/15/11 76 lb 3.2 oz (34.564 kg)  06/27/11 82 lb (37.195 kg)  04/30/11 108 lb 3.9 oz (49.1 kg)  04/25/11 98 lb 8.7 oz (44.7 kg)  04/18/11 86 lb (39.009 kg)  03/14/11 88 lb (39.917 kg)    Usual Body Weight: unknown  % Usual Body Weight:  NA  BMI:  There is no weight on file to calculate BMI.  Estimated Nutritional Needs: Kcal: 1150-1300 Protein: 50-60 grams Fluid: 1.3 L/day  Skin: intact  Diet Order: Diet NPO time specified  EDUCATION NEEDS: -No education needs identified at this time  No intake or output data in the 24 hours ending 07/05/14 0902  Last BM: 3/3  Labs:  No results for input(s): NA, K, CL, CO2, BUN, CREATININE, CALCIUM, MG, PHOS, GLUCOSE in the last 168 hours.  CBG (last 3)   Recent Labs  07/05/14 0638  GLUCAP 248*    Scheduled Meds: . docusate sodium  100 mg Oral BID  . ferrous sulfate  325 mg Oral TID PC  . insulin aspart  0-9 Units Subcutaneous 6 times per day  . levothyroxine  75 mcg Intravenous Daily  . pantoprazole (PROTONIX) IV  40 mg Intravenous Q24H    Continuous Infusions: . 0.9 % NaCl with KCl 20 mEq / L      Past Medical History  Diagnosis Date  . Anorexia   . Weight loss   . Jaundice   . Biliary obstruction   . Hypothyroidism   . Diabetes mellitus   . GERD (gastroesophageal reflux disease)   . Sarcoidosis   . Congestive heart failure December, 2012    [facility]    Past Surgical History  Procedure Laterality Date  . Vagina surgery  as a teen  . Ercp  02/16/2011  Procedure: ENDOSCOPIC RETROGRADE CHOLANGIOPANCREATOGRAPHY (ERCP);  Surgeon: Malissa Hippo, MD;  Location: [facility];  Service: Endoscopy;  Laterality: N/A;  7:30/with Stent Removal  . Biliary stent placement  02/16/2011    Procedure: BILIARY STENT PLACEMENT;  Surgeon: Malissa Hippo, MD;  Location: [facility];  Service: Endoscopy;  Laterality: N/A;  . Sphincterotomy  02/16/2011    Procedure: SPHINCTEROTOMY;  Surgeon: Malissa Hippo, MD;  Location: [facility];  Service: Endoscopy;  Laterality: N/A;  . Ercp  10/08/2010  . Appendectomy    . Ercp  04/20/2011    Procedure: ENDOSCOPIC RETROGRADE CHOLANGIOPANCREATOGRAPHY (ERCP);  Surgeon: Malissa Hippo, MD;  Location: [facility];   Service: Endoscopy;  Laterality: N/A;  removal of stent, removal of multiple stones with basket  . Spyglass cholangioscopy  04/20/2011    Procedure: SPYGLASS CHOLANGIOSCOPY;  Surgeon: Malissa Hippo, MD;  Location: [facility];  Service: Endoscopy;  Laterality: N/A;  . Biliary stent placement  04/20/2011    Procedure: BILIARY STENT PLACEMENT;  Surgeon: Malissa Hippo, MD;  Location: [facility];  Service: Endoscopy;  Laterality: N/A;  . Cholecystectomy  04/27/2011    Procedure: LAPAROSCOPIC CHOLECYSTECTOMY WITH INTRAOPERATIVE CHOLANGIOGRAM;  Surgeon: Fabio Bering, MD;  Location: [facility];  Service: General;  Laterality: N/A;  . Ercp  09/14/2011    Procedure: ENDOSCOPIC RETROGRADE CHOLANGIOPANCREATOGRAPHY (ERCP);  Surgeon: Malissa Hippo, MD;  [facility]: [facility];  Service: Endoscopy;  Laterality: N/A;  ERCP with Stent Removal  . Spyglass cholangioscopy  09/14/2011    Procedure: SPYGLASS CHOLANGIOSCOPY;  Surgeon: Malissa Hippo, MD;  [facility];  Service: Endoscopy;  Laterality: N/A;  . Sphincterotomy  09/14/2011    Procedure: SPHINCTEROTOMY;  Surgeon: Malissa Hippo, MD;  Location: [facility];  Service: Endoscopy;  Laterality: N/A;  . Hip surgery Left November 20.2014    Left Hip Fracture- Done @ Cherlyn Labella RD, LDN Inpatient Clinical Dietitian Pager: [phone] After Hours Pager: [phone]

## 2014-07-05 NOTE — Interval H&P Note (Signed)
History and Physical Interval Note:  07/05/2014 10:19 AM  Misty [name]  has presented today for surgery, with the diagnosis of LEFT SUBCONDYLAR FX  The various methods of treatment have been discussed with the patient and family. After consideration of risks, benefits and other options for treatment, the patient has consented to  Procedure(s): ORIF SUBCONDYLAR FEMUR FX (Left) as a surgical intervention .  The patient's history has been reviewed, patient examined, no change in status, stable for surgery.  I have reviewed the patient's chart and labs.  Questions were answered to the patient's satisfaction.     Pete Schnitzer C

## 2014-07-05 NOTE — Anesthesia Postprocedure Evaluation (Signed)
  Anesthesia Post-op Note  Patient: Misty [name]  Procedure(s) Performed: Procedure(s): Left super condylar femur fracture (Left)  Patient Location: PACU  Anesthesia Type:General  Level of Consciousness: awake and alert   Airway and Oxygen Therapy: Patient Spontanous Breathing  Post-op Pain: mild  Post-op Assessment: Post-op Vital signs reviewed, Patient's Cardiovascular Status Stable and Respiratory Function Stable  Post-op Vital Signs: Reviewed  Filed Vitals:   07/05/14 1306  BP: 180/73  Pulse: 85  Temp: 36.5 C  Resp:     Complications: No apparent anesthesia complications

## 2014-07-05 NOTE — Anesthesia Preprocedure Evaluation (Deleted)
Anesthesia Evaluation  Patient identified by MRN, date of birth, ID band Patient awake    Reviewed: Allergy & Precautions, H&P , NPO status , Patient's Chart, lab work & pertinent test results  Airway Mallampati: II  TM Distance: >3 FB Neck ROM: Full    Dental no notable dental hx. (+) Edentulous Upper, Edentulous Lower, Dental Advisory Given   Pulmonary neg pulmonary ROS,  breath sounds clear to auscultation  Pulmonary exam normal       Cardiovascular negative cardio ROS  Rhythm:Regular Rate:Normal     Neuro/Psych negative neurological ROS  negative psych ROS   GI/Hepatic Neg liver ROS, GERD-  ,  Endo/Other  diabetes, Type 2, Oral Hypoglycemic AgentsHypothyroidism   Renal/GU negative Renal ROS  negative genitourinary   Musculoskeletal   Abdominal   Peds  Hematology negative hematology ROS (+)   Anesthesia Other Findings   Reproductive/Obstetrics negative OB ROS                           Anesthesia Physical Anesthesia Plan  ASA: II  Anesthesia Plan: General   Post-op Pain Management:    Induction: Intravenous  Airway Management Planned: LMA  Additional Equipment:   Intra-op Plan:   Post-operative Plan: Extubation in OR  Informed Consent: I have reviewed the patients History and Physical, chart, labs and discussed the procedure including the risks, benefits and alternatives for the proposed anesthesia with the patient or authorized representative who has indicated his/her understanding and acceptance.   Dental advisory given  Plan Discussed with: CRNA  Anesthesia Plan Comments:       Anesthesia Quick Evaluation

## 2014-07-05 NOTE — Transfer of Care (Signed)
Immediate Anesthesia Transfer of Care Note  Patient: Misty [name]  Procedure(s) Performed: Procedure(s): Left super condylar femur fracture (Left)  Patient Location: PACU  Anesthesia Type:General  Level of Consciousness: awake, alert , oriented, patient cooperative and responds to stimulation  Airway & Oxygen Therapy: Patient Spontanous Breathing and Patient connected to nasal cannula oxygen  Post-op Assessment: Report given to RN and Post -op Vital signs reviewed and stable  Post vital signs: Reviewed and stable  Last Vitals:  Filed Vitals:   07/05/14 0932  BP: 117/57  Pulse: 108  Temp: 36.3 C  Resp: 18    Complications: No apparent anesthesia complications

## 2014-07-05 NOTE — Progress Notes (Signed)
Orthopedic Tech Progress Note Patient Details:  Pattijo B Vanetten 02/10/1930 [id number] Antis296  Patient ID: Minna AntisPiney B Kolk, female   DOB: 02/10/1930, 81 y.o.   MRN: [id number] Pt unable to use trapeze bar patient helper  Nikki DomCrawford, Zyliah Schier 07/05/2014, 9:42 AM

## 2014-07-05 NOTE — Consult Note (Signed)
Reason for Consult: fall at home with left supracondylar femur fracture. Closed and angulated Referring Physician: [facility] [place] [facility]. ( no Ortho coverage this weekend) and  Goodrich MD Hospitalist  Misty [name] is an 81 y.o. female.  HPI: fell at home , previous hip fracture left fixed by dr. Case in [place]. Left distal femur deformity  Past Medical History  Diagnosis Date  . Anorexia   . Weight loss   . Jaundice   . Biliary obstruction   . Hypothyroidism   . Diabetes mellitus   . GERD (gastroesophageal reflux disease)   . Sarcoidosis   . Congestive heart failure December, 2012    Due to pneumonia- [facility]    Past Surgical History  Procedure Laterality Date  . Vagina surgery  as a teen  . Ercp  02/16/2011    Procedure: ENDOSCOPIC RETROGRADE CHOLANGIOPANCREATOGRAPHY (ERCP);  Surgeon: Najeeb U Rehman, MD;  Location: [facility];  Service: Endoscopy;  Laterality: N/A;  7:30/with Stent Removal  . Biliary stent placement  02/16/2011    Procedure: BILIARY STENT PLACEMENT;  Surgeon: Najeeb U Rehman, MD;  Location: [facility];  Service: Endoscopy;  Laterality: N/A;  . Sphincterotomy  02/16/2011    Procedure: SPHINCTEROTOMY;  Surgeon: Najeeb U Rehman, MD;  Location: [facility];  Service: Endoscopy;  Laterality: N/A;  . Ercp  10/08/2010  . Appendectomy    . Ercp  04/20/2011    Procedure: ENDOSCOPIC RETROGRADE CHOLANGIOPANCREATOGRAPHY (ERCP);  Surgeon: Najeeb U Rehman, MD;  Location: [facility];  Service: Endoscopy;  Laterality: N/A;  removal of stent, removal of multiple stones with basket  . Spyglass cholangioscopy  04/20/2011    Procedure: SPYGLASS CHOLANGIOSCOPY;  Surgeon: Najeeb U Rehman, MD;  Location: [facility];  Service: Endoscopy;  Laterality: N/A;  . Biliary stent placement  04/20/2011    Procedure: BILIARY STENT PLACEMENT;  Surgeon: Najeeb U Rehman, MD;  Location: [facility];  Service: Endoscopy;  Laterality: N/A;  . Cholecystectomy  04/27/2011    Procedure: LAPAROSCOPIC CHOLECYSTECTOMY WITH  INTRAOPERATIVE CHOLANGIOGRAM;  Surgeon: Brent C Ziegler, MD;  Location: [facility];  Service: General;  Laterality: N/A;  . Ercp  09/14/2011    Procedure: ENDOSCOPIC RETROGRADE CHOLANGIOPANCREATOGRAPHY (ERCP);  Surgeon: Najeeb U Rehman, MD;  Location: [facility];  Service: Endoscopy;  Laterality: N/A;  ERCP with Stent Removal  . Spyglass cholangioscopy  09/14/2011    Procedure: SPYGLASS CHOLANGIOSCOPY;  Surgeon: Najeeb U Rehman, MD;  Location: [facility];  Service: Endoscopy;  Laterality: N/A;  . Sphincterotomy  09/14/2011    Procedure: SPHINCTEROTOMY;  Surgeon: Najeeb U Rehman, MD;  Location: [facility];  Service: Endoscopy;  Laterality: N/A;  . Hip surgery Left November 20.2014    Left Hip Fracture- Done @ [facility]    Family History  Problem Relation Age of Onset  . Anesthesia problems Neg Hx   . Hypotension Neg Hx   . Malignant hyperthermia Neg Hx   . Pseudochol deficiency Neg Hx   . Scoliosis Son   . Hypertension Son   . Sinusitis Son   . Healthy Son     Social History:  reports that she has never smoked. She has never used smokeless tobacco. She reports that she does not drink alcohol or use illicit drugs.  Allergies:  Allergies  Allergen Reactions  . Nitrofuran Derivatives Hives  . Sulfa Antibiotics Hives and Swelling    Medications: I have reviewed the patient's current medications.  Results for orders placed or performed during the hospital encounter of 07/05/14 (from   the past 48 hour(s))  Glucose, capillary     Status: Abnormal   Collection Time: 07/05/14  6:38 AM  Result Value Ref Range   Glucose-Capillary 248 (H) 70 - 99 mg/dL    No results found.  Review of Systems  Constitutional: Positive for weight loss and malaise/fatigue. Negative for fever and chills.  Eyes:       Glasses  Respiratory: Negative for cough.   Cardiovascular: Negative for chest pain and palpitations.  Gastrointestinal: Negative for heartburn.       Anorexia  Neurological: Positive for dizziness.   Psychiatric/Behavioral: Positive for depression.   Blood pressure 163/79, pulse 107, temperature 97.7 F (36.5 C), temperature source Oral, resp. rate 20, SpO2 97 %. Physical Exam  Constitutional:  Thin elderly chronically ill appearing  Eyes: Pupils are equal, round, and reactive to light.  Neck: Normal range of motion.  Cardiovascular: Normal rate.   Musculoskeletal:  Left distal femur deformity. Pulses normal  Neurological: She is alert.  Skin: Skin is warm and dry.  Psychiatric: She has a normal mood and affect. Her behavior is normal. Thought content normal.    Assessment/Plan: Left distal femur fx for ORIF. Risks of surgery discussed. Labs sent and pending.   [name] C 07/05/2014, 8:47 AM      

## 2014-07-05 NOTE — Brief Op Note (Signed)
07/05/2014  1:09 PM  PATIENT:  Misty [name]  81 y.o. female  PRE-OPERATIVE DIAGNOSIS:  left super condylar femur fracture  POST-OPERATIVE DIAGNOSIS:  left super condylar femur fracture  PROCEDURE:  Procedure(s): Left super condylar femur fracture (Left)  ORIF  SURGEON:  Surgeon(s) and Role:    * Eldred MangesMark C Shellene Sweigert, MD - Primary  PHYSICIAN ASSISTANT:   ASSISTANTS: none   ANESTHESIA:   local and general  EBL:  Total I/O In: 1250 [I.V.:1000; IV Piggyback:250] Out: 900 [Urine:900]  BLOOD ADMINISTERED:none  DRAINS: none   LOCAL MEDICATIONS USED:  MARCAINE     SPECIMEN:  No Specimen  DISPOSITION OF SPECIMEN:  N/A  COUNTS:  YES  TOURNIQUET:   Total Tourniquet Time Documented: Thigh (Left) - 60 minutes Total: Thigh (Left) - 60 minutes   DICTATION: .Other Dictation: Dictation Number [id number]  PLAN OF CARE: already inpt  PATIENT DISPOSITION:  PACU - hemodynamically stable.   Delay start of Pharmacological VTE agent (>24hrs) due to surgical blood loss or risk of bleeding: yes

## 2014-07-05 NOTE — H&P (Signed)
[facility] History and Physical                                                                                    Misty [name], is a 81 y.o. female  MRN: [id number]   DOB - Jun 19, 1929  Admit Date - 07/05/2014  Outpatient Primary MD for the patient is Donzetta Sprung, MD  With History of -  Past Medical History  Diagnosis Date  . Anorexia   . Weight loss   . Jaundice   . Biliary obstruction   . Hypothyroidism   . Diabetes mellitus   . GERD (gastroesophageal reflux disease)   . Sarcoidosis   . [facility]est[facility] heart failure December, 2012    [facility]      Past [facility] History  Procedure Laterality Date  . Vagina surgery  as a teen  . Ercp  02/16/2011    Procedure: ENDOSCOPIC RETROGRADE CHOLANGIOPANCREATOGRAPHY (ERCP);  Surgeon: Malissa Hippo, MD;  Location: [facility];  Service: Endoscopy;  Laterality: N/A;  7:30/with Stent Removal  . Biliary stent placement  02/16/2011    Procedure: BILIARY STENT PLACEMENT;  Surgeon: Malissa Hippo, MD;  Location: [facility];  Service: Endoscopy;  Laterality: N/A;  . Sphincterotomy  02/16/2011    Procedure: SPHINCTEROTOMY;  Surgeon: Malissa Hippo, MD;  Location: [facility];  Service: Endoscopy;  Laterality: N/A;  . Ercp  10/08/2010  . Appendectomy    . Ercp  04/20/2011    Procedure: ENDOSCOPIC RETROGRADE CHOLANGIOPANCREATOGRAPHY (ERCP);  Surgeon: Malissa Hippo, MD;  Location: [facility];  Service: Endoscopy;  Laterality: N/A;  removal of stent, removal of multiple stones with basket  . Spyglass cholangioscopy  04/20/2011    Procedure: SPYGLASS CHOLANGIOSCOPY;  Surgeon: Malissa Hippo, MD;  [facility]: [facility];  Service: Endoscopy;  Laterality: N/A;  . Biliary stent placement  04/20/2011    Procedure: BILIARY STENT PLACEMENT;  Surgeon: Malissa Hippo, MD;  Location: [facility];  Service: Endoscopy;  Laterality: N/A;  . Cholecystectomy  04/27/2011    Procedure: LAPAROSCOPIC CHOLECYSTECTOMY WITH INTRAOPERATIVE CHOLANGIOGRAM;   Surgeon: Fabio Bering, MD;  Location: [facility];  Service: General;  Laterality: N/A;  . Ercp  09/14/2011    Procedure: ENDOSCOPIC RETROGRADE CHOLANGIOPANCREATOGRAPHY (ERCP);  Surgeon: Malissa Hippo, MD;  [facility]: [facility];  Service: Endoscopy;  Laterality: N/A;  ERCP with Stent Removal  . Spyglass cholangioscopy  09/14/2011    Procedure: SPYGLASS CHOLANGIOSCOPY;  Surgeon: Malissa Hippo, MD;  [facility];  Service: Endoscopy;  Laterality: N/A;  . Sphincterotomy  09/14/2011    Procedure: SPHINCTEROTOMY;  Surgeon: Malissa Hippo, MD;  Location: [facility];  Service: Endoscopy;  Laterality: N/A;  . Hip surgery Left November 20.2014    Left Hip Fracture- Done @ [facility]    in for   No chief complaint on file.    HPI Misty [name]  is a 81 y.o. female, with a past medical history of hypothyroidism, diabetes mellitus on oral agents, prior biliary obstruction in setting of papillary stenosis and Mirizzi syndrome, osteoporosis, GERD and sarcoidosis and not on medications. Patient initially presented to [facility] after falling backwards  and subsequently injuring her left knee. X-rays there revealed a comminuted and significantly angulated fracture of the distal femoral metadiaphysis with marked posterior angulation. This was associated with a small knee joint effusion. Incidental finding of diffuse osteopenia. Other clinical evaluation done at the previous hospital included a chest x-ray that revealed no active disease but biapical fibrosis consistent with history of sarcoidosis. An EKG was completed that reveals sinus rhythm with PACs QTC 440 ms. laboratory data included: BUN 13, creatinine 0.60, calcium 9.2 normal LFTs, sodium 1:30, potassium 3.5, anion gap 13.5, glucose 233, WBCs 15,200, hemoglobin 12.8, hematocrit 38.8, platelets 237,000, absolute neutrophils 13.1% neutrophils 86.3%, PT 13.0 INR 0.9 and PTT 29.4. Patient was subsequently transferred to [facility] for admission. She is currently  being evaluated by Dr. Ophelia Charter with orthopedic surgery who plans on proceeding with surgical repair of the distal femur fracture today.  In discussion with the patient she does not recall being dizzy or tripping over anything to cause her fall backwards. No awareness of any palpitations, chest pain, shortness of breath prior to falling. She had no loss of consciousness after the fall. She says it happened very quickly she's not quite sure what really happened. She has had several falls in the past but none recently until yesterday. She fell in 2014 and sustained a left hip fracture that required surgical repair. She has no other specific complaints in regards to her review of systems.  Review of Systems   In addition to the HPI above,  No Fever-chills, myalgias or other constitutional symptoms No Headache, changes with Vision or hearing, new weakness, tingling, numbness in any extremity, No problems swallowing food or Liquids, indigestion/reflux No Chest pain, Cough or Shortness of Breath, palpitations, orthopnea or DOE No Abdominal pain, N/V; no melena or hematochezia, no dark tarry stools, Bowel movements are regular, No dysuria, hematuria or flank pain No new skin rashes, lesions, masses or bruises, No recent weight gain or loss-states has chronically low weight No polyuria, polydypsia or polyphagia,  *A full 10 point Review of Systems was done, except as stated above, all other Review of Systems were negative.  Social History History  Substance Use Topics  . Smoking status: Never Smoker   . Smokeless tobacco: Never Used  . Alcohol Use: No    Family History Family History  Problem Relation Age of Onset  . Anesthesia problems Neg Hx   . Hypotension Neg Hx   . Malignant hyperthermia Neg Hx   . Pseudochol deficiency Neg Hx   . Scoliosis Son   . Hypertension Son   . Sinusitis Son   . Healthy Hypertension  Coronary artery disease  Lupus  Breast cancer  Son Mother , deceased age  64  Brother  Sister  Sister     Prior to Admission medications   Medication Sig Start Date End Date Taking? Authorizing Provider  diazepam (VALIUM) 2 MG tablet Take 2 mg by mouth every 6 (six) hours as needed for anxiety.    Historical Provider, MD  diclofenac sodium (VOLTAREN) 1 % GEL Apply 1 application topically 4 (four) times daily. For arthritis pain    Historical Provider, MD  docusate sodium (COLACE) 100 MG capsule Take 2 capsules (200 mg total) by mouth daily. 02/11/14   Malissa Hippo, MD  glipiZIDE (GLUCOTROL XL) 10 MG 24 hr tablet Take 5 mg by mouth every morning. Patient states that she takes 1/2 of 5 mg tablet daily    Historical Provider, MD  levothyroxine (SYNTHROID,  LEVOTHROID) 137 MCG tablet Take 100 mcg by mouth daily.     Historical Provider, MD  polyethylene glycol (MIRALAX / GLYCOLAX) packet Take 17 g by mouth as needed.    Historical Provider, MD  traMADol (ULTRAM) 50 MG tablet Take 50 mg by mouth as needed.     Historical Provider, MD  ursodiol (ACTIGALL) 300 MG capsule TAKE 1 CAPSULE BY MOUTH DAILY. 12/17/13   Len Blalock, NP    Allergies  Allergen Reactions  . Nitrofuran Derivatives Hives  . Sulfa Antibiotics Hives and Swelling    Physical Exam  Vitals  Blood pressure 163/79, pulse 107, temperature 97.7 F (36.5 C), temperature source Oral, resp. rate 20, SpO2 97 %.   General:  In no acute distress, appears pale and chronically ill and underweight  Psych:  Normal affect, Denies Suicidal or Homicidal ideations, Awake Alert, Oriented X 3. Speech and thought patterns are clear and appropriate, no apparent short term memory deficits  Neuro:   No focal neurological deficits, CN II through XII intact, Strength 5/5 all 4 extremities except diminished left lower extremity secondary to acute knee pain and limited mobility from that, Sensation intact all 4 extremities.  ENT:  Ears and Eyes appear Normal, Conjunctivae clear, PER. Moist oral mucosa without  erythema or exudates.  Neck:  Supple, No lymphadenopathy appreciated  Respiratory:  Symmetrical chest wall movement, Good air movement bilaterally, CTAB. Room Air  Cardiac:  RRR, No Murmurs, no LE edema noted, no JVD, No carotid bruits, peripheral pulses palpable at 2+  Abdomen:  Positive bowel sounds, Soft and scaphoid, Non tender, Non distended,  No masses appreciated, no obvious hepatosplenomegaly  Skin:  No Cyanosis, Normal Skin Turgor, No Skin Rash or Bruise.  Extremities: Left lower extremity with bandage around knee region apparently for support, noted with swelling and effusion appearance, tender, did not ask patient to move the knee, right lower extremity unremarkable  Data Review  CBC No results for input(s): WBC, HGB, HCT, PLT, MCV, MCH, MCHC, RDW, LYMPHSABS, MONOABS, EOSABS, BASOSABS, BANDABS in the last 168 hours.  Invalid input(s): NEUTRABS, BANDSABD  Chemistries  No results for input(s): NA, K, CL, CO2, GLUCOSE, BUN, CREATININE, CALCIUM, MG, AST, ALT, ALKPHOS, BILITOT in the last 168 hours.  Invalid input(s): GFRCGP  CrCl cannot be calculated (Unknown ideal weight.).  No results for input(s): TSH, T4TOTAL, T3FREE, THYROIDAB in the last 72 hours.  Invalid input(s): FREET3  Coagulation profile No results for input(s): INR, PROTIME in the last 168 hours.  No results for input(s): DDIMER in the last 72 hours.  Cardiac Enzymes No results for input(s): CKMB, TROPONINI, MYOGLOBIN in the last 168 hours.  Invalid input(s): CK  Invalid input(s): POCBNP  Urinalysis    Component Value Date/Time   COLORURINE YELLOW 09/14/2011 1219   APPEARANCEUR CLEAR 09/14/2011 1219   LABSPEC 1.015 09/14/2011 1219   PHURINE 7.5 09/14/2011 1219   GLUCOSEU 100* 09/14/2011 1219   HGBUR MODERATE* 09/14/2011 1219   BILIRUBINUR NEGATIVE 09/14/2011 1219   KETONESUR NEGATIVE 09/14/2011 1219   PROTEINUR NEGATIVE 09/14/2011 1219   UROBILINOGEN 0.2 09/14/2011 1219   NITRITE NEGATIVE  09/14/2011 1219   LEUKOCYTESUR SMALL* 09/14/2011 1219    Imaging results:   No results found.   EKG: Sinus rhythm with PACs, QTC less than 500   Assessment & Plan  Principal Problem:   Closed distal femur fracture -Admit to [facility] [address]. -Appreciate orthopedic assistance/Dr. Ophelia Charter -Plan is to proceed with surgical intervention today after 12 noon -Nothing  by mouth -Per Dr. Ophelia CharterYates SCDs for DVT prophylaxis -IV fluids while nothing by mouth; borderline hypokalemia so we'll add potassium to maintenance fluids -Case manager and social worker contacted for potential discharge planning issues -Provide appropriate IV narcotic analgesia preoperatively  Active Problems:   Hyponatremia -Likely reflective of mildly elevated glucose -May also be related to volume depletion -Chek random urine creatinine and sodium -Check serum and urine osmolality -Repeat labs in a.m.    Osteoporosis/osteopenia -Given patient's history of fall backwards not preceded by dizziness or presyncopal symptoms concerning this may have been a spontaneous fracture worsened by the fall -Check calcium, vitamin D and albumin -Patient appears quite underweight so have requested nutritional consultation as well    Sarcoidosis -Patient reports is not currently under medical therapy for this condition -Chest x-ray does demonstrate fibrotic changes bi-apically    Diabetes mellitus, type 2 -CBGs elevated at presentation which is probably not unexpected given acute pain and acute injury -Nothing by mouth so check CBGs every 4 hours and provide sliding scale insulin coverage -Check hemoglobin A1c    Mirizzi's syndrome (h/o post cholecystectomy) -Follows up when necessary with gastroenterologist    Hypothyroidism -On Synthroid prior to admission but need to clarify dosage: Formulation listed as 137 g but actual dose written down as taken 100 g -For now we'll convert to IV and give 75 g daily -Check TSH    GERD  (gastroesophageal reflux disease) -IV Protonix    DVT Prophylaxis: SCDs  Family Communication:   Son at bedside  Code Status:  Full code  Condition:  Stable  Time spent in minutes : 60   ELLIS,ALLISON L. ANP on 07/05/2014 at 7:25 AM  Between 7am to 7pm - Pager - [phone]  After 7pm go to [email or web address] - password [email or web address]  And look for the night coverage person covering me after hours  [facility]

## 2014-07-05 NOTE — H&P (View-Only) (Signed)
Reason for Consult: fall at home with left supracondylar femur fracture. Closed and angulated [facility] ( no [facility] this weekend) and  Irene LimboGoodrich MD Hospitalist  Misty [name] is an 81 y.o. female.  HPI: fell at home , previous hip [place]. Left distal femur deformity  Past Medical History  Diagnosis Date  . Anorexia   . Weight loss   . Jaundice   . Biliary obstruction   . Hypothyroidism   . Diabetes mellitus   . GERD (gastroesophageal reflux disease)   . Sarcoidosis   . Congestive heart failure December, 2012    [facility]    Past Surgical History  Procedure Laterality Date  . Vagina surgery  as a teen  . Ercp  02/16/2011    Procedure: ENDOSCOPIC RETROGRADE CHOLANGIOPANCREATOGRAPHY (ERCP);  Surgeon: Malissa HippoNajeeb U Rehman, MD;  Location: [facility];  Service: Endoscopy;  Laterality: N/A;  7:30/with Stent Removal  . Biliary stent placement  02/16/2011    Procedure: BILIARY STENT PLACEMENT;  Surgeon: Malissa HippoNajeeb U Rehman, MD;  Location: [facility];  Service: Endoscopy;  Laterality: N/A;  . Sphincterotomy  02/16/2011    Procedure: SPHINCTEROTOMY;  Surgeon: Malissa HippoNajeeb U Rehman, MD;  Location: [facility];  Service: Endoscopy;  Laterality: N/A;  . Ercp  10/08/2010  . Appendectomy    . Ercp  04/20/2011    Procedure: ENDOSCOPIC RETROGRADE CHOLANGIOPANCREATOGRAPHY (ERCP);  Surgeon: Malissa HippoNajeeb U Rehman, MD;  Location: [facility];  Service: Endoscopy;  Laterality: N/A;  removal of stent, removal of multiple stones with basket  . Spyglass cholangioscopy  04/20/2011    Procedure: SPYGLASS CHOLANGIOSCOPY;  Surgeon: Malissa HippoNajeeb U Rehman, MD;  Location: [facility];  Service: Endoscopy;  Laterality: N/A;  . Biliary stent placement  04/20/2011    Procedure: BILIARY STENT PLACEMENT;  Surgeon: Malissa HippoNajeeb U Rehman, MD;  Location: [facility];  Service: Endoscopy;  Laterality: N/A;  . Cholecystectomy  04/27/2011    Procedure: LAPAROSCOPIC CHOLECYSTECTOMY WITH  INTRAOPERATIVE CHOLANGIOGRAM;  Surgeon: Fabio BeringBrent C Ziegler, MD;  Location: [facility];  Service: General;  Laterality: N/A;  . Ercp  09/14/2011    Procedure: ENDOSCOPIC RETROGRADE CHOLANGIOPANCREATOGRAPHY (ERCP);  Surgeon: Malissa HippoNajeeb U Rehman, MD;  Location: [facility];  Service: Endoscopy;  Laterality: N/A;  ERCP with Stent Removal  . Spyglass cholangioscopy  09/14/2011    Procedure: SPYGLASS CHOLANGIOSCOPY;  Surgeon: Malissa HippoNajeeb U Rehman, MD;  Location: [facility];  Service: Endoscopy;  Laterality: N/A;  . Sphincterotomy  09/14/2011    Procedure: SPHINCTEROTOMY;  Surgeon: Malissa HippoNajeeb U Rehman, MD;  Location: [facility];  Service: Endoscopy;  Laterality: N/A;  . Hip surgery Left November 20.2014    Left Hip Fracture- Done @ [facility]    Family History  Problem Relation Age of Onset  . Anesthesia problems Neg Hx   . Hypotension Neg Hx   . Malignant hyperthermia Neg Hx   . Pseudochol deficiency Neg Hx   . Scoliosis Son   . Hypertension Son   . Sinusitis Son   . Healthy Son     Social History:  reports that she has never smoked. She has never used smokeless tobacco. She reports that she does not drink alcohol or use illicit drugs.  Allergies:  Allergies  Allergen Reactions  . Nitrofuran Derivatives Hives  . Sulfa Antibiotics Hives and Swelling    Medications: I have reviewed the patient's current medications.  Results for orders placed or performed during the hospital encounter of 07/05/14 (from  the past 48 hour(s))  Glucose, capillary     Status: Abnormal   Collection Time: 07/05/14  6:38 AM  Result Value Ref Range   Glucose-Capillary 248 (H) 70 - 99 mg/dL    No results found.  Review of Systems  Constitutional: Positive for weight loss and malaise/fatigue. Negative for fever and chills.  Eyes:       Glasses  Respiratory: Negative for cough.   Cardiovascular: Negative for chest pain and palpitations.  Gastrointestinal: Negative for heartburn.       Anorexia  Neurological: Positive for dizziness.   Psychiatric/Behavioral: Positive for depression.   Blood pressure 163/79, pulse 107, temperature 97.7 F (36.5 C), temperature source Oral, resp. rate 20, SpO2 97 %. Physical Exam  Constitutional:  Thin elderly chronically ill appearing  Eyes: Pupils are equal, round, and reactive to light.  Neck: Normal range of motion.  Cardiovascular: Normal rate.   Musculoskeletal:  Left distal femur deformity. Pulses normal  Neurological: She is alert.  Skin: Skin is warm and dry.  Psychiatric: She has a normal mood and affect. Her behavior is normal. Thought content normal.    Assessment/Plan: Left distal femur fx for ORIF. Risks of surgery discussed. Labs sent and pending.   Lesleyann Fichter C 07/05/2014, 8:47 AM

## 2014-07-05 NOTE — Op Note (Signed)
NAMDonata Duff:  Gougeon, Posie                ACCOUNT NO.:  [id number]  MEDICAL RECORD NO.:  [id number]  LOCATION:  5N01C                        FACILITY:  [facility]  PHYSICIAN:  Delno Blaisdell C. Ophelia CharterYates, M.D.    DATE OF BIRTH:  [birth year]  DATE OF PROCEDURE:  07/05/2014 DATE OF DISCHARGE:                              OPERATIVE REPORT   PREOPERATIVE DIAGNOSIS:  Left supracondylar femur fracture (without intracondylar extension).  POSTOPERATIVE DIAGNOSIS:  Left supracondylar femur fracture (without intracondylar extension).  PROCEDURE:  Open reduction and internal fixation of left supracondylar femur fracture, lateral compression plate.  SURGEON:  Axelle Szwed C. Ophelia CharterYates, MD  ANESTHESIA:  General plus 20 mL Marcaine local.  TOURNIQUET TIME:  One hour.  IMPLANT:  Biomet lateral supracondylar plate with locking nonlocking screws.  After induction of general anesthesia, proximal thigh tourniquet, patient had some mild hypotension which responded to fluids.  She had previous troch nail done for hip fracture which stopped about the mid femur.  Supracondylar fracture without intracondylar extension and intact pulses.  Patient had poor bone quality, has had multiple medical problems with anemia of chronic disease, history of jaundice, weight loss, anorexia, sarcoidosis, paroxysmal heart failure, and diabetes.  Usual extremity sheets and drapes, entire left lower extremity was prepped from the tourniquet down to the tip of the toes.  Impervious stockinette, Coban, split sheets, drapes, half sheet and extremity sheet and drapes were applied.  Time-out procedure was completed and a sterile skin marker was used for lateral skin incision and skin was sealed with a single Betadine Steri-Drape.  Time-out procedures done.  Ancef prophylaxis 2 g.  Leg was wrapped in Esmarch, tourniquet inflated. Lateral incision was made.  Iliotibial band was split.  Fracture was reduced 6-hole plate was selected, placed on the femur.   C-arm was draped, brought in and continued work, trying to get the plate properly positioned.  Once it was in satisfactory position, central screw hole was placed.  Plate on the clamp was used proximally.  Once it was in satisfactory position and distal screws were filled, proximal screws were filled which helped corrected the extension at the fracture site. There was some combination of the posterior cortex and 1 or 2 screws were little bit long since there were only two 34 mm screws in the set, these were non-sterile.  A total of 5 screws were placed in the shaft.  Proximal hole was not filled since it was come at the very, very tip of the old troch nail that had been placed in the past.  Final spot pictures were taken.  The wound was irrigated. Standard closure with #1 and 0 Vicryl on the tensor fascia, 2-0 on the subcutaneous tissue, skin staple closure, Marcaine infiltration in the skin and then the knee joint.  Total of 20 mL Adaptic, 4x4s, Webril, Ace wrap, and knee immobilizer.  Instrument count and needle count was correct.     Kortney Schoenfelder C. Ophelia CharterYates, M.D.     MCY/MEDQ  D:  07/05/2014  T:  07/05/2014  Job:  [id number]

## 2014-07-06 DIAGNOSIS — E038 Other specified hypothyroidism: Secondary | ICD-10-CM

## 2014-07-06 DIAGNOSIS — M810 Age-related osteoporosis without current pathological fracture: Secondary | ICD-10-CM

## 2014-07-06 DIAGNOSIS — D62 Acute posthemorrhagic anemia: Secondary | ICD-10-CM

## 2014-07-06 DIAGNOSIS — D869 Sarcoidosis, unspecified: Secondary | ICD-10-CM

## 2014-07-06 DIAGNOSIS — K831 Obstruction of bile duct: Secondary | ICD-10-CM

## 2014-07-06 LAB — URINALYSIS, ROUTINE W REFLEX MICROSCOPIC
Bilirubin Urine: NEGATIVE
Glucose, UA: NEGATIVE mg/dL
Ketones, ur: NEGATIVE mg/dL
Nitrite: NEGATIVE
Protein, ur: NEGATIVE mg/dL
Specific Gravity, Urine: 1.008 (ref 1.005–1.030)
Urobilinogen, UA: 0.2 mg/dL (ref 0.0–1.0)
pH: 6 (ref 5.0–8.0)

## 2014-07-06 LAB — CREATININE, URINE, RANDOM: Creatinine, Urine: 38.6 mg/dL

## 2014-07-06 LAB — BASIC METABOLIC PANEL
Anion gap: 5 (ref 5–15)
BUN: 12 mg/dL (ref 6–23)
CO2: 29 mmol/L (ref 19–32)
Calcium: 8.6 mg/dL (ref 8.4–10.5)
Chloride: 99 mmol/L (ref 96–112)
Creatinine, Ser: 0.75 mg/dL (ref 0.50–1.10)
GFR calc Af Amer: 89 mL/min — ABNORMAL LOW (ref >90)
GFR calc non Af Amer: 77 mL/min — ABNORMAL LOW (ref >90)
Glucose, Bld: 108 mg/dL — ABNORMAL HIGH (ref 70–99)
Potassium: 4.3 mmol/L (ref 3.5–5.1)
Sodium: 133 mmol/L — ABNORMAL LOW (ref 135–145)

## 2014-07-06 LAB — SODIUM, URINE, RANDOM: Sodium, Ur: <10 mmol/L

## 2014-07-06 LAB — CBC
HCT: 22.9 % — ABNORMAL LOW (ref 36.0–46.0)
Hemoglobin: 7.6 g/dL — ABNORMAL LOW (ref 12.0–15.0)
MCH: 30.4 pg (ref 26.0–34.0)
MCHC: 33.2 g/dL (ref 30.0–36.0)
MCV: 91.6 fL (ref 78.0–100.0)
Platelets: 164 10*3/uL (ref 150–400)
RBC: 2.5 MIL/uL — ABNORMAL LOW (ref 3.87–5.11)
RDW: 12.8 % (ref 11.5–15.5)
WBC: 10.6 10*3/uL — ABNORMAL HIGH (ref 4.0–10.5)

## 2014-07-06 LAB — URINE MICROSCOPIC-ADD ON

## 2014-07-06 LAB — PREPARE RBC (CROSSMATCH)

## 2014-07-06 LAB — GLUCOSE, CAPILLARY
Glucose-Capillary: 115 mg/dL — ABNORMAL HIGH (ref 70–99)
Glucose-Capillary: 142 mg/dL — ABNORMAL HIGH (ref 70–99)
Glucose-Capillary: 155 mg/dL — ABNORMAL HIGH (ref 70–99)
Glucose-Capillary: 217 mg/dL — ABNORMAL HIGH (ref 70–99)
Glucose-Capillary: 224 mg/dL — ABNORMAL HIGH (ref 70–99)

## 2014-07-06 LAB — OSMOLALITY, URINE: Osmolality, Ur: 201 mOsm/kg — ABNORMAL LOW (ref 390–1090)

## 2014-07-06 MED ORDER — INSULIN ASPART 100 UNIT/ML ~~LOC~~ SOLN
0.0000 [IU] | Freq: Every day | SUBCUTANEOUS | Status: DC
  Administered 2014-07-06 – 2014-07-07 (×2): 2 [IU] via SUBCUTANEOUS

## 2014-07-06 MED ORDER — TRAMADOL HCL 50 MG PO TABS
50.0000 mg | ORAL_TABLET | Freq: Once | ORAL | Status: AC
  Administered 2014-07-06: 50 mg via ORAL
  Filled 2014-07-06: qty 1

## 2014-07-06 MED ORDER — INSULIN ASPART 100 UNIT/ML ~~LOC~~ SOLN
0.0000 [IU] | Freq: Three times a day (TID) | SUBCUTANEOUS | Status: DC
  Administered 2014-07-06 (×2): 3 [IU] via SUBCUTANEOUS
  Administered 2014-07-07 (×2): 2 [IU] via SUBCUTANEOUS
  Administered 2014-07-07: 3 [IU] via SUBCUTANEOUS
  Administered 2014-07-08: 2 [IU] via SUBCUTANEOUS
  Administered 2014-07-08: 3 [IU] via SUBCUTANEOUS

## 2014-07-06 MED ORDER — LEVOTHYROXINE SODIUM 100 MCG PO TABS
100.0000 ug | ORAL_TABLET | Freq: Every day | ORAL | Status: DC
  Administered 2014-07-07 – 2014-07-08 (×2): 100 ug via ORAL
  Filled 2014-07-06 (×2): qty 1

## 2014-07-06 MED ORDER — SODIUM CHLORIDE 0.9 % IV SOLN
Freq: Once | INTRAVENOUS | Status: AC
  Administered 2014-07-06: 10:00:00 via INTRAVENOUS

## 2014-07-06 MED ORDER — SODIUM CHLORIDE 0.9 % IV SOLN
INTRAVENOUS | Status: AC
  Administered 2014-07-06: 18:00:00 via INTRAVENOUS

## 2014-07-06 MED ORDER — PANTOPRAZOLE SODIUM 40 MG PO TBEC
40.0000 mg | DELAYED_RELEASE_TABLET | Freq: Every day | ORAL | Status: DC
  Administered 2014-07-07 – 2014-07-08 (×2): 40 mg via ORAL
  Filled 2014-07-06 (×2): qty 1

## 2014-07-06 MED ORDER — SODIUM CHLORIDE 0.9 % IV BOLUS (SEPSIS): 500.0000 mL | Freq: Once | INTRAVENOUS | Status: DC

## 2014-07-06 NOTE — Evaluation (Signed)
Clinical/Bedside Swallow Evaluation Patient Details  Name: Misty [name] MRN: [id number] Date of Birth: [birth year]  Today's Date: 07/06/2014 Time: SLP Start Time (ACUTE ONLY): 1450 SLP Stop Time (ACUTE ONLY): 1458 SLP Time Calculation (min) (ACUTE ONLY): 8 min  Past Medical History:  Past Medical History  Diagnosis Date  . Anorexia   . Weight loss   . Jaundice   . Biliary obstruction   . Hypothyroidism   . Diabetes mellitus   . GERD (gastroesophageal reflux disease)   . Sarcoidosis   . Congest[facility] heart failure December, 2012    [facility]   Past Surgical History:  Past Surgical History  Procedure Laterality Date  . Vagina surgery  as a teen  . Ercp  02/16/2011    Procedure: ENDOSCOPIC RETROGRADE CHOLANGIOPANCREATOGRAPHY (ERCP);  Surgeon: Malissa Hippo, MD;  Location: [facility];  Service: Endoscopy;  Laterality: N/A;  7:30/with Stent Removal  . Biliary stent placement  02/16/2011    Procedure: BILIARY STENT PLACEMENT;  Surgeon: Malissa Hippo, MD;  Location: [facility];  Service: Endoscopy;  Laterality: N/A;  . Sphincterotomy  02/16/2011    Procedure: SPHINCTEROTOMY;  Surgeon: Malissa Hippo, MD;  Location: [facility];  Service: Endoscopy;  Laterality: N/A;  . Ercp  10/08/2010  . Appendectomy    . Ercp  04/20/2011    Procedure: ENDOSCOPIC RETROGRADE CHOLANGIOPANCREATOGRAPHY (ERCP);  Surgeon: Malissa Hippo, MD;  Location: [facility];  Service: Endoscopy;  Laterality: N/A;  removal of stent, removal of multiple stones with basket  . Spyglass cholangioscopy  04/20/2011    Procedure: SPYGLASS CHOLANGIOSCOPY;  Surgeon: Malissa Hippo, MD;  Location: [facility];  Service: Endoscopy;  Laterality: N/A;  . Biliary stent placement  04/20/2011    Procedure: BILIARY STENT PLACEMENT;  Surgeon: Malissa Hippo, MD;  Location: [facility];  Service: Endoscopy;  Laterality: N/A;  . Cholecystectomy  04/27/2011    Procedure: LAPAROSCOPIC CHOLECYSTECTOMY WITH INTRAOPERATIVE CHOLANGIOGRAM;   Surgeon: Fabio Bering, MD;  Location: [facility];  Service: General;  Laterality: N/A;  . Ercp  09/14/2011    Procedure: ENDOSCOPIC RETROGRADE CHOLANGIOPANCREATOGRAPHY (ERCP);  Surgeon: Malissa Hippo, MD;  Location: [facility];  Service: Endoscopy;  Laterality: N/A;  ERCP with Stent Removal  . Spyglass cholangioscopy  09/14/2011    Procedure: SPYGLASS CHOLANGIOSCOPY;  Surgeon: Malissa Hippo, MD;  Location: [facility];  Service: Endoscopy;  Laterality: N/A;  . Sphincterotomy  09/14/2011    Procedure: SPHINCTEROTOMY;  Surgeon: Malissa Hippo, MD;  Location: [facility];  Service: Endoscopy;  Laterality: N/A;  . Hip surgery Left November 20.2014    Left Hip Fracture- Done @ [facility]   HPI:  Pt is an 81 year old female admitted after fall at home resulting in left supracondylar femur fracture. Pt underwent ORIF on 07/05/14.    Assessment / Plan / Recommendation Clinical Impression  Pt demonstrates normal swallow function. No SLP f/u needed, will sign off.     Aspiration Risk  Mild    Diet Recommendation Regular;Thin liquid   Liquid Administration via: Cup;Straw Medication Administration: Whole meds with liquid Supervision: Patient able to self feed Postural Changes and/or Swallow Maneuvers: Seated upright 90 degrees    Other  Recommendations Oral Care Recommendations: Oral care BID   Follow Up Recommendations  None    Frequency and Duration        Pertinent Vitals/Pain NA    SLP Swallow Goals     Swallow Study Prior Functional Status  General HPI: Pt is an 81 year old female admitted after fall at home resulting in left supracondylar femur fracture. Pt underwent ORIF on 07/05/14.  Type of Study: Bedside swallow evaluation Previous Swallow Assessment: none Diet Prior to this Study: Regular;Thin liquids Temperature Spikes Noted: No Respiratory Status: Room air History of Recent Intubation: Yes Length of Intubations (days): 1 days (for surgery) Date extubated:  07/05/14 Behavior/Cognition: Alert;Cooperative;Pleasant mood Oral Cavity - Dentition: Adequate natural dentition Self-Feeding Abilities: Able to feed self Patient Positioning: Upright in bed Baseline Vocal Quality: Clear Volitional Cough: Strong Volitional Swallow: Able to elicit    Oral/Motor/Sensory Function Overall Oral Motor/Sensory Function: Appears within functional limits for tasks assessed   Ice Chips     Thin Liquid Thin Liquid: Within functional limits    Nectar Thick Nectar Thick Liquid: Not tested   Honey Thick Honey Thick Liquid: Not tested   Puree Puree: Not tested   Solid   GO    Solid: Within functional limits       Misty [name], Misty [name] 07/06/2014,3:08 PM

## 2014-07-06 NOTE — Progress Notes (Signed)
Physical/Occupational Therapy  Note  Evals complete and to follow;   BPs during session noteworthy for hypotension, and were as follows:    07/06/14 0802 07/06/14 0812 07/06/14 0820  Vital Signs  Pulse Rate 80 (!) 111 (!) 120  Pulse Rate Source Dinamap --  --   BP 106/63 mmHg 105/74 mmHg (!) 87/66 mmHg  BP Location Left Arm Left Arm Left Arm  Patient Position (if appropriate) Lying Sitting Sitting (reclined to near-supine post squat pivot transfer)     07/06/14 0827  Vital Signs  Pulse Rate (!) 113  Pulse Rate Source --   BP 118/89 mmHg  BP Location Left Arm  Patient Position (if appropriate) Sitting (more upright in recliner)   Misty [name], PT  Acute Rehabilitation Services Pager [phone] Office [phone]

## 2014-07-06 NOTE — Evaluation (Signed)
Physical Therapy Evaluation Patient Details Name: Misty [name] B Scaduto MRN: [id number] DOB: [birth year] Today's Date: 07/06/2014   History of Present Illness  Pt is an 81 y.o. Female s/p Left ORIF distal femur for fx sustained in fall at home. Pt with hx of multiple falls at home resulting in fxs. PMH of anorexia, DM, GERD, sarcoidosis, CHF, and previous left hip surgery in 03/2013 for fx.  Clinical Impression   Patient is s/p above surgery resulting in functional limitations due to the deficits listed below (see PT Problem List).  Patient will benefit from skilled PT to increase their independence and safety with mobility to allow discharge to the venue listed below.    Noted hypotensive with upright sitting; RN notified; noted plan for blood transfusion;     Follow Up Recommendations SNF;Supervision/Assistance - 24 hour    Equipment Recommendations  None recommended by PT (pretty well-equipped at home)    Recommendations for Other Services       Precautions / Restrictions Precautions Precautions: Fall Precaution Comments: orthostatic Required Braces or Orthoses: Knee Immobilizer - Left Restrictions Weight Bearing Restrictions: Yes LLE Weight Bearing: Partial weight bearing LLE Partial Weight Bearing Percentage or Pounds: 50      Mobility  Bed Mobility Overal bed mobility: Needs Assistance Bed Mobility: Supine to Sit     Supine to sit: Mod assist;HOB elevated     General bed mobility comments: Mod A to progress LEs to EOB and elevate trunk off bed. Bed pad used to rotate hips and pt able to assist with scooting to EOB. Pt shaking minimally on EOB.   Transfers Overall transfer level: Needs assistance Equipment used: Rolling walker (2 wheeled) (gait belt assisted transfer) Transfers: Sit to/from Stand Sit to Stand: Max assist;+2 safety/equipment   Squat pivot transfers: Max assist;+2 safety/equipment     General transfer comment: Max A to stand from EOB with use of RW. Pt  with strong posterior lean and unable to achieve balance in standing. Max A to squat pivot to chair, pt shaking during transfer and mobility. She reports she shakes a little at baseline.   Ambulation/Gait             General Gait Details: Unable to attempt today, with significant posterior lean in standing  Stairs            Wheelchair Mobility    Modified Rankin (Stroke Patients Only)       Balance Overall balance assessment: History of Falls                                           Pertinent Vitals/Pain Pain Assessment: Faces Faces Pain Scale: Hurts little more Pain Location: LLE Pain Descriptors / Indicators: Aching;Grimacing Pain Intervention(s): Limited activity within patient's tolerance;Monitored during session;Repositioned    Home Living Family/patient expects to be discharged to:: Private residence Living Arrangements: Children (lives with son and his wife) Available Help at Discharge: Family;Available 24 hours/day Type of Home: House Home Access: Ramped entrance     Home Layout: One level Home Equipment: Bedside commode;Crutches;Walker - 2 wheels;Wheelchair - manual;Hospital bed Additional Comments: pt takes sponge baths sitting on commode. Uses 3N1 over toilet    Prior Function Level of Independence: Independent with assistive device(s)         Comments: hx of falls with multiple fxs. Pt ambulates minimally with RW.     Hand Dominance  Dominant Hand: Right    Extremity/Trunk Assessment   Upper Extremity Assessment: Generalized weakness           Lower Extremity Assessment: Generalized weakness;LLE deficits/detail   LLE Deficits / Details: Grossly decr AROM and strength, limited by pain postop  Cervical / Trunk Assessment: Normal  Communication   Communication: No difficulties  Cognition Arousal/Alertness: Awake/alert Behavior During Therapy: WFL for tasks assessed/performed;Anxious Overall Cognitive Status:  Within Functional Limits for tasks assessed                      General Comments General comments (skin integrity, edema, etc.): See other note of this date re: orthostatic BPs    Exercises        Assessment/Plan    PT Assessment Patient needs continued PT services  PT Diagnosis Difficulty walking;Generalized weakness;Acute pain   PT Problem List Decreased strength;Decreased range of motion;Decreased activity tolerance;Decreased balance;Decreased mobility;Decreased coordination;Decreased knowledge of use of DME;Decreased knowledge of precautions;Pain;Cardiopulmonary status limiting activity  PT Treatment Interventions DME instruction;Gait training;Stair training;Functional mobility training;Therapeutic activities;Therapeutic exercise;Balance training;Patient/family education   PT Goals (Current goals can be found in the Care Plan section) Acute Rehab PT Goals Patient Stated Goal: to go home PT Goal Formulation: With patient Time For Goal Achievement: 07/20/14 Potential to Achieve Goals: Fair    Frequency Min 3X/week   Barriers to discharge        Co-evaluation PT/OT/SLP Co-Evaluation/Treatment: Yes Reason for Co-Treatment: For patient/therapist safety PT goals addressed during session: Mobility/safety with mobility OT goals addressed during session: ADL's and self-care       End of Session Equipment Utilized During Treatment: Gait belt;Left knee immobilizer Activity Tolerance: Patient tolerated treatment well Patient left: in chair;with call bell/phone within reach;with family/visitor present Nurse Communication: Mobility status         Time: 6962-9528 PT Time Calculation (min) (ACUTE ONLY): 35 min   Charges:   PT Evaluation $Initial PT Evaluation Tier I: 1 Procedure     PT G CodesVan Clines Hamff 07/06/2014, 11:01 AM  Van Clines, PT  Acute Rehabilitation Services Pager [phone] Office [phone]

## 2014-07-06 NOTE — Progress Notes (Signed)
Utilization Review Completed.   Erskine Steinfeldt, RN, BSN Nurse Case Manager  

## 2014-07-06 NOTE — Clinical Social Work Psychosocial (Signed)
Clinical Social Work Department BRIEF PSYCHOSOCIAL ASSESSMENT 07/06/2014  Patient:  Misty [name], Misty [name]     Account Number:  [id number]     Admit date:  07/05/2014  Clinical Social Worker:  Hubert Azure  Date/Time:  07/06/2014 03:51 PM  Referred by:  Physician  Date Referred:  07/06/2014 Referred for  SNF Placement   Other Referral:   Interview type:  Family Other interview type:   CSW met with patient's son and patient.    PSYCHOSOCIAL DATA Living Status:  FAMILY Admitted from facility:   Level of care:   Primary support name:  Misty [name] ([phone]) Primary support relationship to patient:  CHILD, ADULT Degree of support available:   Good. Patient's son present at bedside.    CURRENT CONCERNS Current Concerns  Post-Acute Placement   Other Concerns:    SOCIAL WORK ASSESSMENT / PLAN CSW met with patient and son who was present at bedside. CSW introduced self and explained role. CSW discussed d/c plan with patient and son. Per son, patient lives with him and his wife. Son states patient has been to [facility] before, and he and patient prefer [facility].   Assessment/plan status:  Other - See comment Other assessment/ plan:   CSW to submit PASARR and complete FL2 for placement.   Information/referral to community resources:    PATIENT'S/FAMILY'S RESPONSE TO PLAN OF CARE: Patient and son agreeable to SNF placement and prefer [facility] and [facility] for rehab before she returns home. Patient and son like how rehab at the facility, "helped her to get better."   Carrington Clamp, [place] [facility] Clinical Social Worker [phone]

## 2014-07-06 NOTE — Progress Notes (Signed)
PROGRESS NOTE  Misty [name]:[id number] DOB: 11/07/1929 DOA: 07/05/2014 PCP: Donzetta Sprung, MD  HPI/Recap of past 24 hours: S/p ORIF to left super condylar femur fracture on 3/5, getting blood transfusion, no acute compliant. Son at bedside.  Assessment/Plan: Principal Problem:   Fracture of femur, distal, left, closed Active Problems:   Mirizzi's syndrome (h/o post cholecystectomy)   Sarcoidosis   Hypothyroidism   Diabetes mellitus, type 2   GERD (gastroesophageal reflux disease)   Osteoporosis   Hyponatremia  Closed distal femur fracture -s/p ORIF to left super condylar femur fracture on 3/5, Appreciate orthopedic assistance/Dr. Ophelia Charter -Per Dr. Ophelia Charter SCDs for DVT prophylaxis -Case manager and social worker contacted for potential discharge planning issues -denies pain  Post op anemia: prbc transfusion/ h/o chf? Will monitor volume status, check echo.  Active Problems:  Hyponatremia -Likely reflective of mildly elevated glucose/dehydration -continue ivf with ns.    Osteoporosis/osteopenia -Given patient's history of fall backwards not preceded by dizziness or presyncopal symptoms concerning this may have been a spontaneous fracture worsened by the fall -Check calcium, vitamin D and albumin -Patient appears quite underweight so have requested nutritional consultation as well   Sarcoidosis -Patient reports is not currently under medical therapy for this condition, has been stable for the last 79yrs -Chest x-ray does demonstrate fibrotic changes bi-apically   Diabetes mellitus, type 2 -carb modified diet/ssi - hemoglobin A1c pending   Mirizzi's syndrome (h/o post cholecystectomy) -Follows up when necessary with gastroenterologist   Hypothyroidism -On Synthroid prior to admission but need to clarify dosage: Formulation listed as 137 g but actual dose written down as taken 100 g -home meds resumed post op, tsh wnl.   GERD (gastroesophageal reflux  disease) -Protonix    DVT Prophylaxis: SCDs/lovenox  Family Communication: Son at bedside  Code Status: Full code    Consultants:  ortho  Procedures: ORIF to left super condylar femur fracture on 3/5,  Antibiotics:  none   Objective: BP 86/64 mmHg  Pulse 102  Temp(Src) 98.6 F (37 C) (Oral)  Resp 18  SpO2 100%  Intake/Output Summary (Last 24 hours) at 07/06/14 1544 Last data filed at 07/06/14 1400  Gross per 24 hour  Intake 966.25 ml  Output   1300 ml  Net -333.75 ml   There were no vitals filed for this visit.  Exam:   General: NAD, Frail, very thin/cachectic. Weight is 84 pounds.  Cardiovascular: Regular rate and rhythm, no murmur, rub or gallop. No lower extremity edema.  Respiratory: Clear to auscultation bilaterally, no wheezes, rales or rhonchi. Normal respiratory effort.  Musculoskeletal: post op changes, left leg in imobolizer.  Psychiatric: grossly normal mood and affect, speech fluent and appropriate  Neurologic: grossly non-focal.   Data Reviewed: Basic Metabolic Panel:  Recent Labs Lab 07/05/14 0920 07/05/14 1640 07/06/14 0515  NA  --   --  133*  K  --   --  4.3  CL  --   --  99  CO2  --   --  29  GLUCOSE  --   --  108*  BUN  --   --  12  CREATININE  --  0.89 0.75  CALCIUM 8.6  --  8.6   Liver Function Tests:  Recent Labs Lab 07/05/14 0920  ALBUMIN 3.8   No results for input(s): LIPASE, AMYLASE in the last 168 hours. No results for input(s): AMMONIA in the last 168 hours. CBC:  Recent Labs Lab 07/05/14 1640 07/06/14 0515  WBC 10.4  10.6*  HGB 9.1* 7.6*  HCT 27.2* 22.9*  MCV 92.5 91.6  PLT 188 164   Cardiac Enzymes:   No results for input(s): CKTOTAL, CKMB, CKMBINDEX, TROPONINI in the last 168 hours. BNP (last 3 results) No results for input(s): BNP in the last 8760 hours.  ProBNP (last 3 results) No results for input(s): PROBNP in the last 8760 hours.  CBG:  Recent Labs Lab 07/05/14 2148  07/06/14 0015 07/06/14 0515 07/06/14 0747 07/06/14 1127  GLUCAP 223* 142* 115* 155* 217*    Recent Results (from the past 240 hour(s))  Surgical pcr screen     Status: None   Collection Time: 07/05/14  9:39 AM  Result Value Ref Range Status   MRSA, PCR NEGATIVE NEGATIVE Final   Staphylococcus aureus NEGATIVE NEGATIVE Final    Comment:        The Xpert SA Assay (FDA approved for NASAL specimens in patients over 34 years of age), is one component of a comprehensive surveillance program.  Test performance has been validated by [facility] for patients greater than or equal to 47 year old. It is not intended to diagnose infection nor to guide or monitor treatment.      Studies: Dg C-arm 1-60 Min  07/05/2014   CLINICAL DATA:  ORIF left supracondylar femur fracture  EXAM: DG C-ARM 61-120 MIN; LEFT FEMUR 2 VIEWS  TECHNIQUE: Five fluoroscopic spot views  FLUOROSCOPY TIME:  Radiation Exposure Index (as provided by the fluoroscopic device):  If the device does not provide the exposure index:  Fluoroscopy Time (in minutes and seconds):  0 minutes 52 seconds  Number of Acquired Images:  5  COMPARISON:  07/05/2014  FINDINGS: There is a compression plate across the distal half of the femur with 9 screws attaching it underlying bone. There is also a partially visualized intra medullary nail.  There is a comminuted supracondylar femur fracture with persistent but decreased displacement and angulation.  IMPRESSION: ORIF comminuted distal femur fracture   Electronically Signed   By: Esperanza Heir M.D.   On: 07/05/2014 14:16   Dg Femur Min 2 Views Left  07/05/2014   CLINICAL DATA:  ORIF left supracondylar femur fracture  EXAM: DG C-ARM 61-120 MIN; LEFT FEMUR 2 VIEWS  TECHNIQUE: Five fluoroscopic spot views  FLUOROSCOPY TIME:  Radiation Exposure Index (as provided by the fluoroscopic device):  If the device does not provide the exposure index:  Fluoroscopy Time (in minutes and seconds):  0 minutes 52  seconds  Number of Acquired Images:  5  COMPARISON:  07/05/2014  FINDINGS: There is a compression plate across the distal half of the femur with 9 screws attaching it underlying bone. There is also a partially visualized intra medullary nail.  There is a comminuted supracondylar femur fracture with persistent but decreased displacement and angulation.  IMPRESSION: ORIF comminuted distal femur fracture   Electronically Signed   By: Esperanza Heir M.D.   On: 07/05/2014 14:16    Scheduled Meds: . docusate sodium  100 mg Oral BID  . enoxaparin (LOVENOX) injection  40 mg Subcutaneous Q24H  . feeding supplement (GLUCERNA SHAKE)  237 mL Oral BID BM  . feeding supplement (PRO-STAT SUGAR FREE 64)  30 mL Oral Daily  . ferrous sulfate  325 mg Oral TID PC  . insulin aspart  0-5 Units Subcutaneous QHS  . insulin aspart  0-9 Units Subcutaneous TID WC  . [START ON 07/07/2014] levothyroxine  100 mcg Oral QAC breakfast  . multivitamin with  minerals  1 tablet Oral Daily  . [START ON 07/07/2014] pantoprazole  40 mg Oral Daily  . sodium chloride  500 mL Intravenous Once    Continuous Infusions: . sodium chloride        Emri Sample  [facility] Pager [phone]. If 7PM-7AM, please contact night-coverage at [email or web address], password [email or web address] 07/06/2014, 3:44 PM  LOS: 1 day

## 2014-07-06 NOTE — Evaluation (Signed)
Occupational Therapy Evaluation Patient Details Name: Misty [name] MRN: [id number] DOB: 10/14/1929 Today's Date: 07/06/2014    History of Present Illness Pt is an 81 y.o. Female s/p Left ORIF distal femur for fx sustained in fall at home. Pt with hx of multiple falls at home resulting in fxs. PMH of anorexia, DM, GERD, sarcoidosis, CHF, and previous left hip surgery in 03/2013 for fx.   Clinical Impression   PTA pt lived at home and was independent with dressing and sponge bathing and ambulated short distances with use of RW. Pt has her son at home to assist with ADLs and is hopeful to return home at min A level. At this time, recommend SNF due to limitations with mobility and ADLs due to pain, decreased ROM, and orthostatic hypotension. Pt will benefit from acute OT to address LB ADLs and functional transfers.     Follow Up Recommendations  SNF;Supervision/Assistance - 24 hour    Equipment Recommendations  None recommended by OT    Recommendations for Other Services       Precautions / Restrictions Precautions Precautions: Fall Precaution Comments: orthostatic Required Braces or Orthoses: Knee Immobilizer - Left Restrictions Weight Bearing Restrictions: Yes LLE Weight Bearing: Partial weight bearing LLE Partial Weight Bearing Percentage or Pounds: 50      Mobility Bed Mobility Overal bed mobility: Needs Assistance Bed Mobility: Supine to Sit     Supine to sit: Mod assist;HOB elevated     General bed mobility comments: Mod A to progress LEs to EOB and elevate trunk off bed. Bed pad used to rotate hips and pt able to assist with scooting to EOB. Pt shaking minimally on EOB.   Transfers Overall transfer level: Needs assistance Equipment used: Rolling walker (2 wheeled) (gait belt assisted transfer) Transfers: Sit to/from [employer]Stand;Squat Pivot Transfers Sit to Stand: Max assist;+2 safety/equipment   Squat pivot transfers: Max assist;+2 safety/equipment     General  transfer comment: Max A to stand from EOB with use of RW. Pt with strong posterior lean and unable to achieve balance in standing. Max A to squat pivot to chair, pt shaking during transfer and mobility. She reports she shakes a little at baseline.     Balance Overall balance assessment: History of Falls                                          ADL Overall ADL's : Needs assistance/impaired Eating/Feeding: Set up;Sitting   Grooming: Set up;Sitting   Upper Body Bathing: Set up;Sitting   Lower Body Bathing: Total assistance;+2 for physical assistance;Sit to/from stand   Upper Body Dressing : Set up;Sitting   Lower Body Dressing: Total assistance;+2 for physical assistance;Sit to/from stand   Toilet Transfer: Maximal assistance;Squat-pivot Toilet Transfer Details (indicate cue type and reason): bed>BSC Toileting- Clothing Manipulation and Hygiene: Total assistance         General ADL Comments: Pt with low Hgb this AM and orthostatic after transfer. Please see note re: vitals.      Vision Additional Comments: Pt reports no change from baseline.           Pertinent Vitals/Pain Pain Assessment: Faces Faces Pain Scale: Hurts little more Pain Location: LLE Pain Descriptors / Indicators: Aching Pain Intervention(s): Limited activity within patient's tolerance;Monitored during session;Repositioned;RN gave pain meds during session     Hand Dominance Right   Extremity/Trunk Assessment Upper Extremity Assessment  Upper Extremity Assessment: Generalized weakness   Lower Extremity Assessment Lower Extremity Assessment: Defer to PT evaluation   Cervical / Trunk Assessment Cervical / Trunk Assessment: Normal   Communication Communication Communication: No difficulties   Cognition Arousal/Alertness: Awake/alert Behavior During Therapy: WFL for tasks assessed/performed;Anxious Overall Cognitive Status: Within Functional Limits for tasks assessed                                 Home [name] Family/patient expects to be discharged to:: Private residence [name] Arrangements: Children (lives with son and his wife) Available Help at Discharge: Family;Available 24 hours/day Type of Home: House Home Access: Ramped entrance     Home Layout: One level     Bathroom Shower/Tub: Chief Strategy Officer: Standard     Home Equipment: Bedside commode;Crutches;Walker - 2 wheels;Wheelchair - manual;Hospital bed   Additional Comments: pt takes sponge baths sitting on commode. Uses 3N1 over toilet      Prior Functioning/Environment Level of Independence: Independent with assistive device(s)        Comments: hx of falls with multiple fxs. Pt ambulates minimally with RW.    OT Diagnosis: Generalized weakness;Acute pain   OT Problem List: Decreased strength;Decreased range of motion;Decreased activity tolerance;Impaired balance (sitting and/or standing);Decreased knowledge of use of DME or AE;Decreased knowledge of precautions;Pain   OT Treatment/Interventions: Self-care/ADL training;Therapeutic exercise;Energy conservation;DME and/or AE instruction;Therapeutic activities;Patient/family education;Balance training    OT Goals(Current goals can be found in the care plan section) Acute Rehab OT Goals Patient Stated Goal: to go home OT Goal Formulation: With patient Time For Goal Achievement: 07/20/14 Potential to Achieve Goals: Good ADL Goals Pt Will Perform Lower Body Bathing: with min assist;sit to/from stand Pt Will Perform Lower Body Dressing: with min assist;sit to/from stand Pt Will Transfer to Toilet: with min assist;ambulating;bedside commode Pt Will Perform Toileting - Clothing Manipulation and hygiene: with min assist;sit to/from stand  OT Frequency: Min 2X/week           Co-evaluation PT/OT/SLP Co-Evaluation/Treatment: Yes Reason for Co-Treatment: For patient/therapist safety   OT goals addressed during  session: ADL's and self-care      End of Session Equipment Utilized During Treatment: Gait belt;Rolling walker;Left knee immobilizer Nurse Communication: Mobility status  Activity Tolerance: Patient tolerated treatment well Patient left: in chair;with call bell/phone within reach;with chair alarm set;with family/visitor present   Time: 1610-9604 OT Time Calculation (min): 33 min Charges:  OT General Charges $OT Visit: 1 Procedure OT Evaluation $Initial OT Evaluation Tier I: 1 Procedure G-Codes:    Rae Lips 07-25-14, 9:02 AM  Misty [name], OTR/L Occupational Therapist [phone] (pager)

## 2014-07-06 NOTE — Progress Notes (Signed)
Subjective: 1 Day Post-Op Procedure(s) (LRB): Left super condylar femur fracture (Left) Patient reports pain as moderate.    Objective: Vital signs in last 24 hours: Temp:  [97.4 F (36.3 C)-98.9 F (37.2 C)] 98 F (36.7 C) (03/06 0512) Pulse Rate:  [74-120] 113 (03/06 0827) Resp:  [16-24] 18 (03/06 0805) BP: (87-180)/(43-89) 118/89 mmHg (03/06 0827) SpO2:  [96 %-100 %] 100 % (03/06 0512)  Intake/Output from previous day: 03/05 0701 - 03/06 0700 In: 1851.3 [I.V.:1601.3; IV Piggyback:250] Out: 1900 [Urine:1900] Intake/Output this shift:     Recent Labs  07/05/14 1640 07/06/14 0515  HGB 9.1* 7.6*    Recent Labs  07/05/14 1640 07/06/14 0515  WBC 10.4 10.6*  RBC 2.94* 2.50*  HCT 27.2* 22.9*  PLT 188 164    Recent Labs  07/05/14 0920 07/05/14 1640 07/06/14 0515  NA  --   --  133*  K  --   --  4.3  CL  --   --  99  CO2  --   --  29  BUN  --   --  12  CREATININE  --  0.89 0.75  GLUCOSE  --   --  108*  CALCIUM 8.6  --  8.6   No results for input(s): LABPT, INR in the last 72 hours.  Neurologically intact  Assessment/Plan: 1 Day Post-Op Procedure(s) (LRB): Left super condylar femur fracture (Left) Up with therapy  Got hypotensive with therapy. Will transfuse two unit PRBC's.  With other medical problems.  Misty [name] C 07/06/2014, 8:51 AM

## 2014-07-07 ENCOUNTER — Encounter (HOSPITAL_COMMUNITY): Payer: Self-pay | Admitting: Orthopaedic Surgery

## 2014-07-07 DIAGNOSIS — I509 Heart failure, unspecified: Secondary | ICD-10-CM

## 2014-07-07 LAB — VITAMIN D 25 HYDROXY (VIT D DEFICIENCY, FRACTURES): Vit D, 25-Hydroxy: 30.3 ng/mL (ref 30.0–100.0)

## 2014-07-07 LAB — CBC
HCT: 30.3 % — ABNORMAL LOW (ref 36.0–46.0)
HEMATOCRIT: 33.7 % — AB (ref 36.0–46.0)
Hemoglobin: 11.9 g/dL — ABNORMAL LOW (ref 12.0–15.0)
Hemoglobin: 10.4 g/dL — ABNORMAL LOW (ref 12.0–15.0)
MCH: 31 pg (ref 26.0–34.0)
MCH: 31 pg (ref 26.0–34.0)
MCHC: 35.3 g/dL (ref 30.0–36.0)
MCHC: 34.3 g/dL (ref 30.0–36.0)
MCV: 87.8 fL (ref 78.0–100.0)
MCV: 90.2 fL (ref 78.0–100.0)
PLATELETS: 123 10*3/uL — AB (ref 150–400)
Platelets: 109 10*3/uL — ABNORMAL LOW (ref 150–400)
RBC: 3.84 MIL/uL — AB (ref 3.87–5.11)
RBC: 3.36 MIL/uL — AB (ref 3.87–5.11)
RDW: 14.9 % (ref 11.5–15.5)
RDW: 15.2 % (ref 11.5–15.5)
WBC: 9.3 10*3/uL (ref 4.0–10.5)
WBC: 7.6 10*3/uL (ref 4.0–10.5)

## 2014-07-07 LAB — HEPATIC FUNCTION PANEL
ALT: 12 U/L (ref 0–35)
AST: 28 U/L (ref 0–37)
Albumin: 2.9 g/dL — ABNORMAL LOW (ref 3.5–5.2)
Alkaline Phosphatase: 39 U/L (ref 39–117)
BILIRUBIN DIRECT: 0.5 mg/dL (ref 0.0–0.5)
BILIRUBIN INDIRECT: 1.5 mg/dL — AB (ref 0.3–0.9)
Total Bilirubin: 2 mg/dL — ABNORMAL HIGH (ref 0.3–1.2)
Total Protein: 5.2 g/dL — ABNORMAL LOW (ref 6.0–8.3)

## 2014-07-07 LAB — TYPE AND SCREEN
ABO/RH(D): O POS
Antibody Screen: NEGATIVE
Unit division: 0
Unit division: 0

## 2014-07-07 LAB — URINE CULTURE
COLONY COUNT: NO GROWTH
CULTURE: NO GROWTH

## 2014-07-07 LAB — GLUCOSE, CAPILLARY
GLUCOSE-CAPILLARY: 195 mg/dL — AB (ref 70–99)
GLUCOSE-CAPILLARY: 245 mg/dL — AB (ref 70–99)
Glucose-Capillary: 229 mg/dL — ABNORMAL HIGH (ref 70–99)
Glucose-Capillary: 181 mg/dL — ABNORMAL HIGH (ref 70–99)
Glucose-Capillary: 222 mg/dL — ABNORMAL HIGH (ref 70–99)

## 2014-07-07 LAB — BASIC METABOLIC PANEL
Anion gap: 4 — ABNORMAL LOW (ref 5–15)
BUN: 15 mg/dL (ref 6–23)
CHLORIDE: 101 mmol/L (ref 96–112)
CO2: 29 mmol/L (ref 19–32)
CREATININE: 0.6 mg/dL (ref 0.50–1.10)
Calcium: 7.8 mg/dL — ABNORMAL LOW (ref 8.4–10.5)
GFR calc Af Amer: >90 mL/min (ref >90)
GFR calc non Af Amer: 83 mL/min — ABNORMAL LOW (ref >90)
Glucose, Bld: 183 mg/dL — ABNORMAL HIGH (ref 70–99)
Potassium: 3.7 mmol/L (ref 3.5–5.1)
Sodium: 134 mmol/L — ABNORMAL LOW (ref 135–145)

## 2014-07-07 LAB — PROTIME-INR
INR: 1.14 (ref 0.00–1.49)
Prothrombin Time: 14.7 seconds (ref 11.6–15.2)

## 2014-07-07 LAB — HEMOGLOBIN A1C
HEMOGLOBIN A1C: 7.2 % — AB (ref 4.8–5.6)
MEAN PLASMA GLUCOSE: 160 mg/dL

## 2014-07-07 MED ORDER — TRAMADOL HCL 50 MG PO TABS
50.0000 mg | ORAL_TABLET | ORAL | Status: DC | PRN
  Administered 2014-07-07: 50 mg via ORAL
  Filled 2014-07-07: qty 1

## 2014-07-07 MED ORDER — TRAMADOL HCL 50 MG PO TABS: 50.0000 mg | ORAL_TABLET | Freq: Four times a day (QID) | ORAL | Status: DC

## 2014-07-07 MED ORDER — SENNOSIDES-DOCUSATE SODIUM 8.6-50 MG PO TABS
2.0000 | ORAL_TABLET | Freq: Two times a day (BID) | ORAL | Status: DC
  Administered 2014-07-07 – 2014-07-08 (×2): 2 via ORAL
  Filled 2014-07-07 (×2): qty 2

## 2014-07-07 MED ORDER — URSODIOL 300 MG PO CAPS
300.0000 mg | ORAL_CAPSULE | Freq: Every day | ORAL | Status: DC
  Administered 2014-07-07 – 2014-07-08 (×2): 300 mg via ORAL
  Filled 2014-07-07 (×2): qty 1

## 2014-07-07 MED ORDER — POLYETHYLENE GLYCOL 3350 17 G PO PACK
17.0000 g | PACK | Freq: Every day | ORAL | Status: DC
  Administered 2014-07-08: 17 g via ORAL
  Filled 2014-07-07: qty 1

## 2014-07-07 MED ORDER — TRAMADOL HCL 50 MG PO TABS
50.0000 mg | ORAL_TABLET | Freq: Four times a day (QID) | ORAL | Status: DC | PRN
Start: 2014-07-07 — End: 2014-07-07
  Administered 2014-07-07: 50 mg via ORAL
  Filled 2014-07-07: qty 1

## 2014-07-07 NOTE — Progress Notes (Signed)
Physical Therapy Treatment Patient Details Name: Misty [name] MRN: [id number] DOB: 1930-03-07 Today's Date: 07/07/2014    History of Present Illness Pt is an 81 y.o. Female s/p Left ORIF distal femur for fx sustained in fall at home. Pt with hx of multiple falls at home resulting in fxs. PMH of anorexia, DM, GERD, sarcoidosis, CHF, and previous left hip surgery in 03/2013 for fx.    PT Comments    Noting much better activity tolerance today, and able to stand and take steps to [facility]; Much less "shaking" or tremors today as well; Overall progressing well; Anticipate continuing good progress at post-acute rehabilitation.   Follow Up Recommendations  SNF;Supervision/Assistance - 24 hour     Equipment Recommendations  None recommended by PT (pretty well-equipped at home)    Recommendations for Other Services       Precautions / Restrictions Precautions Precautions: Fall Required Braces or Orthoses: Knee Immobilizer - Left Restrictions Weight Bearing Restrictions: Yes LLE Weight Bearing: Partial weight bearing LLE Partial Weight Bearing Percentage or Pounds: 50    Mobility  Bed Mobility Overal bed mobility: Needs Assistance Bed Mobility: Supine to Sit     Supine to sit: Min assist     General bed mobility comments: Cues for technique; took extra time, but needing less physical assist; min assist to use bed pad to square off hips at EOB; used bedrails to pull and scoot  Transfers Overall transfer level: Needs assistance Equipment used: Rolling walker (2 wheeled) Transfers: Sit to/from Stand Sit to Stand: Mod assist         General transfer comment: Heavy mod assist to power up; max cues and support to fully extend hips and get weight shift anteriorly as posterior lean continues  Ambulation/Gait Ambulation/Gait assistance: Mod assist Ambulation Distance (Feet):  (pivot steps bed to chair) Assistive device: Rolling walker (2 wheeled) Gait Pattern/deviations:  Shuffle;Leaning posteriorly     General Gait Details: Cues for sequence and to bear weight into RW for 50%PWB; heavy mod assist as pt continued with posterior lean, though with good moments wen she was able to more organize her center over the RW   Stairs            Wheelchair Mobility    Modified Rankin (Stroke Patients Only)       Balance Overall balance assessment: History of Falls                                  Cognition Arousal/Alertness: Awake/alert Behavior During Therapy: WFL for tasks assessed/performed;Anxious Overall Cognitive Status: Within Functional Limits for tasks assessed                      Exercises      General Comments        Pertinent Vitals/Pain Pain Assessment: Faces Faces Pain Scale: Hurts little more Pain Location: LLE Pain Descriptors / Indicators: Grimacing Pain Intervention(s): Limited activity within patient's tolerance;Monitored during session;Repositioned    Home Living                      Prior Function            PT Goals (current goals can now be found in the care plan section) Acute Rehab PT Goals Patient Stated Goal: to go home PT Goal Formulation: With patient Time For Goal Achievement: 07/20/14 Potential to Achieve Goals: Fair Progress towards  PT goals: Progressing toward goals    Frequency  Min 3X/week    PT Plan Current plan remains appropriate    Co-evaluation             End of Session Equipment Utilized During Treatment: Gait belt;Left knee immobilizer Activity Tolerance: Patient tolerated treatment well Patient left: with call bell/phone within reach;with family/visitor present (on Bedside commode; RN aware)     Time: [id number] PT Time Calculation (min) (ACUTE ONLY): 16 min  Charges:  $Therapeutic Activity: 8-22 mins                    G Codes:      Van ClinesGarrigan, Jayleene Glaeser Hamff 07/07/2014, 12:00 PM  Van ClinesHolly Kanav Kazmierczak, [place]PT  Acute Rehabilitation Services Pager  [phone] Office [phone]

## 2014-07-07 NOTE — Progress Notes (Signed)
Subjective: 2 Days Post-Op Procedure(s) (LRB): Left super condylar femur fracture (Left) Patient reports pain as mild.  Patient wants tramadol for pain.  Objective: Vital signs in last 24 hours: Temp:  [97.9 F (36.6 C)-99.8 F (37.7 C)] 98.5 F (36.9 C) (03/07 0500) Pulse Rate:  [89-113] 96 (03/07 0500) Resp:  [18] 18 (03/07 0500) BP: (84-126)/(39-70) 126/70 mmHg (03/07 0500) SpO2:  [96 %-100 %] 99 % (03/07 0500)  Intake/Output from previous day: 03/06 0701 - 03/07 0700 In: 1162.5 [P.O.:240; I.V.:252.5; Blood:670] Out: 300 [Urine:300] Intake/Output this shift:     Recent Labs  07/05/14 1640 07/06/14 0515 07/06/14 1945 07/07/14 0601  HGB 9.1* 7.6* 11.9* 10.4*    Recent Labs  07/06/14 1945 07/07/14 0601  WBC 9.3 7.6  RBC 3.84* 3.36*  HCT 33.7* 30.3*  PLT 123* PENDING    Recent Labs  07/05/14 0920 07/05/14 1640 07/06/14 0515  NA  --   --  133*  K  --   --  4.3  CL  --   --  99  CO2  --   --  29  BUN  --   --  12  CREATININE  --  0.89 0.75  GLUCOSE  --   --  108*  CALCIUM 8.6  --  8.6    Recent Labs  07/07/14 0601  INR 1.14    Neurologically intact  Assessment/Plan: 2 Days Post-Op Procedure(s) (LRB): Left super condylar femur fracture (Left)    Acute blood loss anemia due to femur fracture.  Discharge to SNF  Louiza Moor C 07/07/2014, 9:12 AM

## 2014-07-07 NOTE — Clinical Social Work Placement (Addendum)
Clinical Social Work Department CLINICAL SOCIAL WORK PLACEMENT NOTE 07/07/2014  Patient:  Misty [name],Misty [name]  Account Number:  [id number] Admit date:  07/05/2014  Clinical Social Worker:  Read DriversEGINA Tashianna Broome, LCSWA  Date/time:  07/07/2014 11:47 AM  Clinical Social Work is seeking post-discharge placement for this patient at the following level of care:   SKILLED NURSING   (*CSW will update this form in Epic as items are completed)   07/07/2014  Patient/family provided with [facility] Department of Clinical Social Work's list of facilities offering this level of care within the geographic area requested by the patient (or if unable, by the patient's family).  07/07/2014  Patient/family informed of their freedom to choose among providers that offer the needed level of care, that participate in Medicare, Medicaid or managed care program needed by the patient, have an available bed and are willing to accept the patient.  07/07/2014  Patient/family informed of [facility]' ownership interest in [facility], as well as of the fact that they are under no obligation to receive care at this facility.  PASARR submitted to EDS on  PASARR number received on   FL2 transmitted to all facilities in geographic area requested by pt/family on  07/07/2014 FL2 transmitted to all facilities within larger geographic area on   Patient informed that his/her managed care company has contracts with or will negotiate with  certain facilities, including the following:     Patient/family informed of bed offers received:  07/07/2014 [facility] Physician recommends and patient chooses bed at  n/a  [facility] on  07/08/2014 Patient to be transferred to facility by [facility] Patient and family notified of transfer on 07/08/2014 Name of family member notified:  Misty [name] (son)  The following physician request were entered in Epic:   Additional Comments: PASARR  existing   Vickii PennaGina Meshulem Onorato, LCSWA [phone]  Psychiatric & Orthopedics (5N 1-16) Clinical Social Worker

## 2014-07-07 NOTE — Progress Notes (Signed)
  Echocardiogram 2D Echocardiogram has been performed.  Misty [name], Misty [name] 07/07/2014, 9:06 AM

## 2014-07-07 NOTE — Clinical Documentation Improvement (Signed)
"  Post-op anemia: PRBC transfusion" documented in current chart.  Labs as below.  Please provide greater specificity of the post-op anemia in your progress note and carry over to the discharge summary.    Component      RBC Hemoglobin HCT  Latest Ref Rng      3.87 - 5.11 MIL/uL 12.0 - 15.0 g/dL 81.136.0 - 91.446.0 %  7/8/29563/08/2014     4:40 PM 2.94 (L) 9.1 (L) 27.2 (L)  07/06/2014     5:15 AM 2.50 (L) 7.6 (L) 22.9 (L)  07/06/2014     7:45 PM 3.84 (L) 11.9 (L) 33.7 (L)   Possible Clinical Conditions: -Acute blood loss anemia -Other anemia (please specify acuity and type) -Unable to determine at present  Thank you, Doy MinceVangela Oney Folz, RN [phone] Clinical Documentation Specialist

## 2014-07-07 NOTE — Progress Notes (Signed)
07/07/14 PT/OT recommended SNF. Referral made to CSW, CSW working on SNF placement. CM will continue to follow until discharged.

## 2014-07-07 NOTE — Progress Notes (Signed)
Inpatient Diabetes Program Recommendations  AACE/ADA: New Consensus Statement on Inpatient Glycemic Control (2013)  Target Ranges:  Prepandial:   less than 140 mg/dL      Peak postprandial:   less than 180 mg/dL (1-2 hours)      Critically ill patients:  140 - 180 mg/dL     Results for Minna AntisBOWMAN, Krina B (MRN [id number]) as of 07/07/2014 09:30  Ref. Range 07/06/2014 07:47 07/06/2014 11:27 07/06/2014 21:18  Glucose-Capillary Latest Range: 70-99 mg/dL 782155 (H) 956217 (H) 213224 (H)     Chief Complaint: Femur Fracture  History: DM, Sarcoidosis  Home DM Meds: Glipizide 2.5 mg daily  Current DM Orders: Novolog Sensitive SSI tid ac + HS    **Note A1c pending  **Patient having elevated postprandial glucose levels    MD- Please consider adding Novolog Meal Coverage- Novolog 3 units tid with meals (hold if patient NPO, hold if patient eats < 50% of meal)    Will follow Ambrose FinlandJeannine Johnston Jaxtyn Linville RN, MSN, CDE Diabetes Coordinator Inpatient Diabetes Program Team Pager: [phone] (8a-10p)

## 2014-07-07 NOTE — Progress Notes (Signed)
PROGRESS NOTE  Misty [name] ZOX:[id number] DOB: 1929/09/29 DOA: 07/05/2014 PCP: Donzetta Sprung, MD  HPI/Recap of past 24 hours: S/p ORIF to left super condylar femur fracture on 3/5,  Received prbcx2 on 3/6 Sitting in chair today on 3/7 ,states feeling better, reported no bm for 4 days.  Son at bedside.  Assessment/Plan: Principal Problem:   Fracture of femur, distal, left, closed Active Problems:   Mirizzi's syndrome (h/o post cholecystectomy)   Sarcoidosis   Hypothyroidism   Diabetes mellitus, type 2   GERD (gastroesophageal reflux disease)   Osteoporosis   Hyponatremia  Closed distal femur fracture -s/p ORIF to left super condylar femur fracture on 3/5, Appreciate orthopedic assistance/Dr. Ophelia Charter -Per Dr. Ophelia Charter SCDs for DVT prophylaxis -social worker to arrange snf placement   Post op anemia: h/h stable after prbc transfusionx2.  h/o chf? Will monitor volume status, check echo. bp stable, heart rate better.  Active Problems:  Hyponatremia -Likely reflective of mildly elevated glucose/dehydration -continue ivf with ns 75cc/hr, likely will d/c ivf 3/8.   Osteoporosis/osteopenia -Given patient's history of fall backwards not preceded by dizziness or presyncopal symptoms concerning this may have been a spontaneous fracture worsened by the fall -Check calcium, vitamin D and albumin -Patient appears quite underweight so have requested nutritional consultation as well   Sarcoidosis -Patient reports is not currently under medical therapy for this condition, has been stable for the last 69yrs -Chest x-ray does demonstrate fibrotic changes bi-apically   Diabetes mellitus, type 2 -carb modified diet/ssi - hemoglobin A1c pending   Mirizzi's syndrome (h/o post cholecystectomy) -Follows up when necessary with gastroenterologist   Hypothyroidism -On Synthroid prior to admission but need to clarify dosage: Formulation listed as 137 g but actual dose written down as  taken 100 g -home meds resumed post op, tsh wnl.   GERD (gastroesophageal reflux disease) -Protonix  Constipation: start stool softener.  DVT Prophylaxis: SCDs/lovenox  Family Communication: Son at bedside  Code Status: Full code    Consultants:  ortho  Procedures: ORIF to left super condylar femur fracture on 3/5,  Antibiotics:  none   Objective: BP 129/57 mmHg  Pulse 87  Temp(Src) 98.1 F (36.7 C) (Oral)  Resp 18  SpO2 95%  Intake/Output Summary (Last 24 hours) at 07/07/14 1335 Last data filed at 07/07/14 0500  Gross per 24 hour  Intake  827.5 ml  Output      0 ml  Net  827.5 ml   There were no vitals filed for this visit.  Exam:   General: NAD, Frail, very thin/cachectic. Weight is 84 pounds.  Cardiovascular: Regular rate and rhythm, no murmur, rub or gallop. No lower extremity edema.  Respiratory: Clear to auscultation bilaterally, no wheezes, rales or rhonchi. Normal respiratory effort.  Musculoskeletal: post op changes, left leg in imobolizer.  Psychiatric: grossly normal mood and affect, speech fluent and appropriate  Neurologic: grossly non-focal.   Data Reviewed: Basic Metabolic Panel:  Recent Labs Lab 07/05/14 0920 07/05/14 1640 07/06/14 0515 07/07/14 0601  NA  --   --  133* 134*  K  --   --  4.3 3.7  CL  --   --  99 101  CO2  --   --  29 29  GLUCOSE  --   --  108* 183*  BUN  --   --  12 15  CREATININE  --  0.89 0.75 0.60  CALCIUM 8.6  --  8.6 7.8*   Liver Function Tests:  Recent Labs  Lab 07/05/14 0920 07/07/14 0601  AST  --  28  ALT  --  12  ALKPHOS  --  39  BILITOT  --  2.0*  PROT  --  5.2*  ALBUMIN 3.8 2.9*   No results for input(s): LIPASE, AMYLASE in the last 168 hours. No results for input(s): AMMONIA in the last 168 hours. CBC:  Recent Labs Lab 07/05/14 1640 07/06/14 0515 07/06/14 1945 07/07/14 0601  WBC 10.4 10.6* 9.3 7.6  HGB 9.1* 7.6* 11.9* 10.4*  HCT 27.2* 22.9* 33.7* 30.3*  MCV 92.5  91.6 87.8 90.2  PLT 188 164 123* 109*   Cardiac Enzymes:   No results for input(s): CKTOTAL, CKMB, CKMBINDEX, TROPONINI in the last 168 hours. BNP (last 3 results) No results for input(s): BNP in the last 8760 hours.  ProBNP (last 3 results) No results for input(s): PROBNP in the last 8760 hours.  CBG:  Recent Labs Lab 07/06/14 1127 07/06/14 1629 07/06/14 2118 07/07/14 0621 07/07/14 1148  GLUCAP 217* 229* 224* 195* 245*    Recent Results (from the past 240 hour(s))  Surgical pcr screen     Status: None   Collection Time: 07/05/14  9:39 AM  Result Value Ref Range Status   MRSA, PCR NEGATIVE NEGATIVE Final   Staphylococcus aureus NEGATIVE NEGATIVE Final    Comment:        The Xpert SA Assay (FDA approved for NASAL specimens in patients over 79 years of age), is one component of a comprehensive surveillance program.  Test performance has been validated by [facility] for patients greater than or equal to 79 year old. It is not intended to diagnose infection nor to guide or monitor treatment.      Studies: No results found.  Scheduled Meds: . enoxaparin (LOVENOX) injection  40 mg Subcutaneous Q24H  . feeding supplement (GLUCERNA SHAKE)  237 mL Oral BID BM  . feeding supplement (PRO-STAT SUGAR FREE 64)  30 mL Oral Daily  . ferrous sulfate  325 mg Oral TID PC  . insulin aspart  0-5 Units Subcutaneous QHS  . insulin aspart  0-9 Units Subcutaneous TID WC  . levothyroxine  100 mcg Oral QAC breakfast  . multivitamin with minerals  1 tablet Oral Daily  . pantoprazole  40 mg Oral Daily  . polyethylene glycol  17 g Oral Daily  . senna-docusate  2 tablet Oral BID  . ursodiol  300 mg Oral Daily    Continuous Infusions: . sodium chloride 75 mL/hr at 07/06/14 1738      Lacey Wallman  [facility] Pager [phone]. If 7PM-7AM, please contact night-coverage at [email or web address], password [email or web address] 07/07/2014, 1:35 PM  LOS: 2 days

## 2014-07-08 DIAGNOSIS — R739 Hyperglycemia, unspecified: Secondary | ICD-10-CM | POA: Diagnosis not present

## 2014-07-08 DIAGNOSIS — E871 Hypo-osmolality and hyponatremia: Secondary | ICD-10-CM | POA: Diagnosis not present

## 2014-07-08 DIAGNOSIS — J984 Other disorders of lung: Secondary | ICD-10-CM | POA: Diagnosis not present

## 2014-07-08 DIAGNOSIS — J8410 Pulmonary fibrosis, unspecified: Secondary | ICD-10-CM | POA: Diagnosis not present

## 2014-07-08 DIAGNOSIS — R279 Unspecified lack of coordination: Secondary | ICD-10-CM | POA: Diagnosis not present

## 2014-07-08 DIAGNOSIS — G8918 Other acute postprocedural pain: Secondary | ICD-10-CM | POA: Diagnosis not present

## 2014-07-08 DIAGNOSIS — S72402A Unspecified fracture of lower end of left femur, initial encounter for closed fracture: Secondary | ICD-10-CM | POA: Diagnosis not present

## 2014-07-08 DIAGNOSIS — M7989 Other specified soft tissue disorders: Secondary | ICD-10-CM | POA: Diagnosis not present

## 2014-07-08 DIAGNOSIS — Z9889 Other specified postprocedural states: Secondary | ICD-10-CM | POA: Diagnosis not present

## 2014-07-08 DIAGNOSIS — F419 Anxiety disorder, unspecified: Secondary | ICD-10-CM | POA: Diagnosis not present

## 2014-07-08 DIAGNOSIS — R2242 Localized swelling, mass and lump, left lower limb: Secondary | ICD-10-CM | POA: Diagnosis not present

## 2014-07-08 DIAGNOSIS — T8189XA Other complications of procedures, not elsewhere classified, initial encounter: Secondary | ICD-10-CM | POA: Diagnosis not present

## 2014-07-08 DIAGNOSIS — D869 Sarcoidosis, unspecified: Secondary | ICD-10-CM | POA: Diagnosis not present

## 2014-07-08 DIAGNOSIS — E1165 Type 2 diabetes mellitus with hyperglycemia: Secondary | ICD-10-CM | POA: Diagnosis not present

## 2014-07-08 DIAGNOSIS — Z9181 History of falling: Secondary | ICD-10-CM | POA: Diagnosis not present

## 2014-07-08 DIAGNOSIS — R262 Difficulty in walking, not elsewhere classified: Secondary | ICD-10-CM | POA: Diagnosis not present

## 2014-07-08 DIAGNOSIS — K449 Diaphragmatic hernia without obstruction or gangrene: Secondary | ICD-10-CM | POA: Diagnosis not present

## 2014-07-08 DIAGNOSIS — K219 Gastro-esophageal reflux disease without esophagitis: Secondary | ICD-10-CM | POA: Diagnosis not present

## 2014-07-08 DIAGNOSIS — S72452D Displaced supracondylar fracture without intracondylar extension of lower end of left femur, subsequent encounter for closed fracture with routine healing: Secondary | ICD-10-CM | POA: Diagnosis not present

## 2014-07-08 DIAGNOSIS — M79605 Pain in left leg: Secondary | ICD-10-CM | POA: Diagnosis not present

## 2014-07-08 DIAGNOSIS — Z7982 Long term (current) use of aspirin: Secondary | ICD-10-CM | POA: Diagnosis not present

## 2014-07-08 DIAGNOSIS — E039 Hypothyroidism, unspecified: Secondary | ICD-10-CM | POA: Diagnosis not present

## 2014-07-08 DIAGNOSIS — S72452S Displaced supracondylar fracture without intracondylar extension of lower end of left femur, sequela: Secondary | ICD-10-CM | POA: Diagnosis not present

## 2014-07-08 DIAGNOSIS — M1990 Unspecified osteoarthritis, unspecified site: Secondary | ICD-10-CM | POA: Diagnosis not present

## 2014-07-08 DIAGNOSIS — M6281 Muscle weakness (generalized): Secondary | ICD-10-CM | POA: Diagnosis not present

## 2014-07-08 DIAGNOSIS — E119 Type 2 diabetes mellitus without complications: Secondary | ICD-10-CM | POA: Diagnosis not present

## 2014-07-08 DIAGNOSIS — M810 Age-related osteoporosis without current pathological fracture: Secondary | ICD-10-CM | POA: Diagnosis not present

## 2014-07-08 DIAGNOSIS — Z7401 Bed confinement status: Secondary | ICD-10-CM | POA: Diagnosis not present

## 2014-07-08 DIAGNOSIS — R278 Other lack of coordination: Secondary | ICD-10-CM | POA: Diagnosis not present

## 2014-07-08 DIAGNOSIS — S72002A Fracture of unspecified part of neck of left femur, initial encounter for closed fracture: Secondary | ICD-10-CM | POA: Diagnosis not present

## 2014-07-08 DIAGNOSIS — K831 Obstruction of bile duct: Secondary | ICD-10-CM | POA: Diagnosis not present

## 2014-07-08 DIAGNOSIS — E038 Other specified hypothyroidism: Secondary | ICD-10-CM | POA: Diagnosis not present

## 2014-07-08 DIAGNOSIS — Z4789 Encounter for other orthopedic aftercare: Secondary | ICD-10-CM | POA: Diagnosis not present

## 2014-07-08 DIAGNOSIS — M79652 Pain in left thigh: Secondary | ICD-10-CM | POA: Diagnosis not present

## 2014-07-08 DIAGNOSIS — D62 Acute posthemorrhagic anemia: Secondary | ICD-10-CM | POA: Diagnosis not present

## 2014-07-08 LAB — BASIC METABOLIC PANEL
Anion gap: 3 — ABNORMAL LOW (ref 5–15)
BUN: 15 mg/dL (ref 6–23)
CO2: 30 mmol/L (ref 19–32)
Calcium: 8.2 mg/dL — ABNORMAL LOW (ref 8.4–10.5)
Chloride: 101 mmol/L (ref 96–112)
Creatinine, Ser: 0.68 mg/dL (ref 0.50–1.10)
GFR calc Af Amer: >90 mL/min (ref >90)
GFR, EST NON AFRICAN AMERICAN: 79 mL/min — AB (ref >90)
GLUCOSE: 168 mg/dL — AB (ref 70–99)
POTASSIUM: 4.1 mmol/L (ref 3.5–5.1)
Sodium: 134 mmol/L — ABNORMAL LOW (ref 135–145)

## 2014-07-08 LAB — CBC
HCT: 29.1 % — ABNORMAL LOW (ref 36.0–46.0)
Hemoglobin: 9.8 g/dL — ABNORMAL LOW (ref 12.0–15.0)
MCH: 30.3 pg (ref 26.0–34.0)
MCHC: 33.7 g/dL (ref 30.0–36.0)
MCV: 90.1 fL (ref 78.0–100.0)
Platelets: 114 10*3/uL — ABNORMAL LOW (ref 150–400)
RBC: 3.23 MIL/uL — ABNORMAL LOW (ref 3.87–5.11)
RDW: 14.4 % (ref 11.5–15.5)
WBC: 6.2 10*3/uL (ref 4.0–10.5)

## 2014-07-08 LAB — GLUCOSE, CAPILLARY
Glucose-Capillary: 164 mg/dL — ABNORMAL HIGH (ref 70–99)
Glucose-Capillary: 201 mg/dL — ABNORMAL HIGH (ref 70–99)

## 2014-07-08 MED ORDER — PRO-STAT SUGAR FREE PO LIQD: 30.0000 mL | Freq: Every day | ORAL | Status: AC

## 2014-07-08 MED ORDER — METFORMIN HCL 500 MG PO TABS: 500.0000 mg | ORAL_TABLET | Freq: Two times a day (BID) | ORAL | Status: DC

## 2014-07-08 MED ORDER — ASPIRIN EC 325 MG PO TBEC: 325.0000 mg | DELAYED_RELEASE_TABLET | Freq: Two times a day (BID) | ORAL | Status: DC

## 2014-07-08 MED ORDER — TRAMADOL HCL 50 MG PO TABS: 50.0000 mg | ORAL_TABLET | Freq: Four times a day (QID) | ORAL | Status: DC | PRN

## 2014-07-08 MED ORDER — TRAMADOL HCL 50 MG PO TABS: 50.0000 mg | ORAL_TABLET | Freq: Four times a day (QID) | ORAL | Status: AC | PRN

## 2014-07-08 MED ORDER — GLUCERNA SHAKE PO LIQD: 237.0000 mL | Freq: Two times a day (BID) | ORAL | Status: AC

## 2014-07-08 MED ORDER — MINERAL OIL RE ENEM
1.0000 | ENEMA | Freq: Once | RECTAL | Status: DC
  Filled 2014-07-08: qty 1

## 2014-07-08 MED ORDER — DIAZEPAM 2 MG PO TABS: 2.0000 mg | ORAL_TABLET | Freq: Three times a day (TID) | ORAL | Status: DC | PRN

## 2014-07-08 NOTE — Discharge Summary (Signed)
Discharge Summary  Misty [name] ZOX:[id number] DOB: 1929/09/01  PCP: Donzetta Sprung, MD  Admit date: 07/05/2014 Discharge date: 07/08/2014  Time spent: >9mins  Recommendations for Outpatient Follow-up:  1. F/u with pmd Dr. Donzetta Sprung, pmd to repeat cbc/bmp in 2wks. 2. F/u with orthopedics Dr. Ophelia Charter   Discharge Diagnoses:  Active Hospital Problems   Diagnosis Date Noted  . Fracture of femur, distal, left, closed 07/05/2014  . Diabetes mellitus, type 2 07/05/2014  . GERD (gastroesophageal reflux disease) 07/05/2014  . Osteoporosis 07/05/2014  . Hyponatremia 07/05/2014  . Hypothyroidism 11/28/2012  . Mirizzi's syndrome (h/o post cholecystectomy) 06/27/2011  . Sarcoidosis 06/27/2011    Resolved Hospital Problems   Diagnosis Date Noted Date Resolved  No resolved problems to display.    Discharge Condition: stable  Diet recommendation: heart healthy and carb modified  There were no vitals filed for this visit.  History of present illness:  81 year old woman with history of hip fracture in the past who presented after a fall 3/4 on her back. She has subsequent left knee pain. She went to [facility] where she was diagnosed with a distal comminuted femur fracture left side. Patient was transferred to [facility] for operative intervention.  On arrival to the medical floor afebrile, pulse 107, hypertensive. No hypoxia. Random blood sugar 248.   Records from [facility]: Chest x-ray no acute disease, bilateral apical fibrosis correlating with history of sarcoidosis. Left knee films revealed comminuted and significantly angulated fracture distal femoral metaphysis. EKG showed sinus rhythm, PAC, nonspecific ST changes. No acute changes seen. Compared to previous study 04/28/2011, no acute changes seen.   She reports she was standing yesterday with her walker when she lost her balance and fell onto her back. Specifically she did not fall on her left knee. She does have a  history of hip fracture and arm fracture after falls in the past. In general she is in fairly good health. No chest pain, no shortness of breath. She denies any cardiac history, has never seen a cardiologist and never had any stress testing done on her heart. She never has chest pain or shortness of breath with exertion. Heart failure is listed in her history but there is no evidence to substantiate that in discussion with the family. Regardless she is asymptomatic.  Hospital Course:  Principal Problem:   Fracture of femur, distal, left, closed Active Problems:   Mirizzi's syndrome (h/o post cholecystectomy)   Sarcoidosis   Hypothyroidism   Diabetes mellitus, type 2   GERD (gastroesophageal reflux disease)   Osteoporosis   Hyponatremia  Closed distal femur fracture -s/p ORIF to left super condylar femur fracture on 3/5,  -patient is discharged on EC asa 325 bid for DVT prophylaxis, Dr. Ophelia Charter to determine duration at hospital follow up appointment.  Post op anemia: h/h stable after prbc transfusionx2. h/h stable at time of discharge   chronic diastolic chf: Clinically dry, echo with adequate LVEF, does has chronic grade 2 diastolic dysfunction.   Active Problems:  Hyponatremia -Likely reflective of mildly elevated glucose/dehydration -improved with  ivf with ns 75cc/hr,    Osteoporosis/osteopenia -Given patient's history of fall backwards not preceded by dizziness or presyncopal symptoms concerning this may have been a spontaneous fracture worsened by the fall -calcium 8.2, vitamin D 30and albumin 2.9 -nutritional consultation obtained in the hospital, patient is started on nutrition supplement   Sarcoidosis -Patient reports is not currently under medical therapy for this condition, has been stable for the last 80yrs -  Chest x-ray does demonstrate fibrotic changes bi-apically   Diabetes mellitus, type 2, noninsulin dependent -carb modified diet - hemoglobin A1c  7.2 -discharged with metformin 500mg  po bid   Mirizzi's syndrome (h/o post cholecystectomy) -Follows up when necessary with gastroenterologist   Hypothyroidism -On Synthroid prior to admission but need to clarify dosage: Formulation listed as 137 g but actual dose written down as taken 100 g -home meds resumed post op, tsh wnl.   GERD (gastroesophageal reflux disease) -Protonix  Constipation: start stool softener. Received enema prior to discharge     Code Status: Full code    Consultants:  ortho  Procedures: ORIF to left super condylar femur fracture on 3/5, prbc x2units  Antibiotics:  none  Discharge Exam: BP 95/55 mmHg  Pulse 85  Temp(Src) 97.9 F (36.6 C) (Oral)  Resp 16  SpO2 96%   General: NAD, Frail, very thin/cachectic. Weight is 84 pounds.  Cardiovascular: Regular rate and rhythm, no murmur, rub or gallop. No lower extremity edema.  Respiratory: Clear to auscultation bilaterally, no wheezes, rales or rhonchi. Normal respiratory effort.  Musculoskeletal: post op changes, left leg in imobolizer.  Psychiatric: grossly normal mood and affect, speech fluent and appropriate  Neurologic: grossly non-focal.    Discharge Instructions You were cared for by a hospitalist during your hospital stay. If you have any questions about your discharge medications or the care you received while you were in the hospital after you are discharged, you can call the unit and asked to speak with the hospitalist on call if the hospitalist that took care of you is not available. Once you are discharged, your primary care physician will handle any further medical issues. Please note that NO REFILLS for any discharge medications will be authorized once you are discharged, as it is imperative that you return to your primary care physician (or establish a relationship with a primary care physician if you do not have one) for your aftercare needs so that they can reassess  your need for medications and monitor your lab values.      Discharge Instructions    Increase activity slowly    Complete by:  As directed             Medication List    STOP taking these medications        glipiZIDE 2.5 MG 24 hr tablet  Commonly known as:  GLUCOTROL XL      TAKE these medications        aspirin EC 325 MG tablet  Take 1 tablet (325 mg total) by mouth 2 (two) times daily.     diazepam 2 MG tablet  Commonly known as:  VALIUM  Take 2 mg by mouth every 8 (eight) hours as needed for anxiety.     diclofenac sodium 1 % Gel  Commonly known as:  VOLTAREN  Apply 1 application topically 4 (four) times daily. For arthritis pain     feeding supplement (GLUCERNA SHAKE) Liqd  Take 237 mLs by mouth 2 (two) times daily between meals.     feeding supplement (PRO-STAT SUGAR FREE 64) Liqd  Take 30 mLs by mouth daily.     levothyroxine 100 MCG tablet  Commonly known as:  SYNTHROID, LEVOTHROID  Take 100 mcg by mouth daily before breakfast.     metFORMIN 500 MG tablet  Commonly known as:  GLUCOPHAGE  Take 1 tablet (500 mg total) by mouth 2 (two) times daily with a meal.     pantoprazole 40  MG tablet  Commonly known as:  PROTONIX  Take 40 mg by mouth daily.     polyethylene glycol packet  Commonly known as:  MIRALAX / GLYCOLAX  Take 17 g by mouth as needed.     traMADol 50 MG tablet  Commonly known as:  ULTRAM  Take 50 mg by mouth every 6 (six) hours as needed for moderate pain.     ursodiol 300 MG capsule  Commonly known as:  ACTIGALL  TAKE 1 CAPSULE BY MOUTH DAILY.       Allergies  Allergen Reactions  . Nitrofuran Derivatives Hives  . Sulfa Antibiotics Hives and Swelling   Follow-up Information    Follow up with YATES,MARK C, MD In 1 week.   Specialty:  Orthopedic Surgery   Contact information:   [address] [place] [address] [phone]       Follow up with Donzetta SprungANIEL, TERRY, MD In 2 weeks.   Specialty:  Family Medicine   Contact  information:   [address] [place] [address] [phone]        The results of significant diagnostics from this hospitalization (including imaging, microbiology, ancillary and laboratory) are listed below for reference.    Significant Diagnostic Studies: Dg C-arm 1-60 Min  07/05/2014   CLINICAL DATA:  ORIF left supracondylar femur fracture  EXAM: DG C-ARM 61-120 MIN; LEFT FEMUR 2 VIEWS  TECHNIQUE: Five fluoroscopic spot views  FLUOROSCOPY TIME:  Radiation Exposure Index (as provided by the fluoroscopic device):  If the device does not provide the exposure index:  Fluoroscopy Time (in minutes and seconds):  0 minutes 52 seconds  Number of Acquired Images:  5  COMPARISON:  07/05/2014  FINDINGS: There is a compression plate across the distal half of the femur with 9 screws attaching it underlying bone. There is also a partially visualized intra medullary nail.  There is a comminuted supracondylar femur fracture with persistent but decreased displacement and angulation.  IMPRESSION: ORIF comminuted distal femur fracture   Electronically Signed   By: Esperanza Heiraymond  Rubner M.D.   On: 07/05/2014 14:16   Dg Femur Min 2 Views Left  07/05/2014   CLINICAL DATA:  ORIF left supracondylar femur fracture  EXAM: DG C-ARM 61-120 MIN; LEFT FEMUR 2 VIEWS  TECHNIQUE: Five fluoroscopic spot views  FLUOROSCOPY TIME:  Radiation Exposure Index (as provided by the fluoroscopic device):  If the device does not provide the exposure index:  Fluoroscopy Time (in minutes and seconds):  0 minutes 52 seconds  Number of Acquired Images:  5  COMPARISON:  07/05/2014  FINDINGS: There is a compression plate across the distal half of the femur with 9 screws attaching it underlying bone. There is also a partially visualized intra medullary nail.  There is a comminuted supracondylar femur fracture with persistent but decreased displacement and angulation.  IMPRESSION: ORIF comminuted distal femur fracture   Electronically Signed   By: Esperanza Heiraymond   Rubner M.D.   On: 07/05/2014 14:16    Microbiology: Recent Results (from the past 240 hour(s))  Surgical pcr screen     Status: None   Collection Time: 07/05/14  9:39 AM  Result Value Ref Range Status   MRSA, PCR NEGATIVE NEGATIVE Final   Staphylococcus aureus NEGATIVE NEGATIVE Final    Comment:        The Xpert SA Assay (FDA approved for NASAL specimens in patients over 79 years of age), is one component of a comprehensive surveillance program.  Test performance has been  validated by [facility] for patients greater than or equal to 80 year old. It is not intended to diagnose infection nor to guide or monitor treatment.   Urine culture     Status: None   Collection Time: 07/06/14 12:15 AM  Result Value Ref Range Status   Specimen Description URINE, RANDOM  Final   Special Requests NONE  Final   Colony Count NO GROWTH Performed at [facility]   Final   Culture NO GROWTH Performed at [facility]   Final   Report Status 07/07/2014 FINAL  Final     Labs: Basic Metabolic Panel:  Recent Labs Lab 07/05/14 0920 07/05/14 1640 07/06/14 0515 07/07/14 0601 07/08/14 0519  NA  --   --  133* 134* 134*  K  --   --  4.3 3.7 4.1  CL  --   --  99 101 101  CO2  --   --  GLUCOSE  --   --  108* 183* 168*  BUN  --   --  CREATININE  --  0.89 0.75 0.60 0.68  CALCIUM 8.6  --  8.6 7.8* 8.2*   Liver Function Tests:  Recent Labs Lab 07/05/14 0920 07/07/14 0601  AST  --  28  ALT  --  12  ALKPHOS  --  39  BILITOT  --  2.0*  PROT  --  5.2*  ALBUMIN 3.8 2.9*   No results for input(s): LIPASE, AMYLASE in the last 168 hours. No results for input(s): AMMONIA in the last 168 hours. CBC:  Recent Labs Lab 07/05/14 1640 07/06/14 0515 07/06/14 1945 07/07/14 0601 07/08/14 0519  WBC 10.4 10.6* 9.3 7.6 6.2  HGB 9.1* 7.6* 11.9* 10.4* 9.8*  HCT 27.2* 22.9* 33.7* 30.3* 29.1*  MCV 92.5 91.6 87.8 90.2 90.1  PLT 188 164 123* 109* 114*    Cardiac Enzymes: No results for input(s): CKTOTAL, CKMB, CKMBINDEX, TROPONINI in the last 168 hours. BNP: BNP (last 3 results) No results for input(s): BNP in the last 8760 hours.  ProBNP (last 3 results) No results for input(s): PROBNP in the last 8760 hours.  CBG:  Recent Labs Lab 07/07/14 0621 07/07/14 1148 07/07/14 1619 07/07/14 2113 07/08/14 0635  GLUCAP 195* 245* 181* 222* 164*       Signed:  Layken Doenges  [facility] 07/08/2014, 9:24 AM

## 2014-07-08 NOTE — Progress Notes (Signed)
Physical Therapy Treatment Patient Details Name: Misty [name] MRN: [id number] DOB: 1929-09-03 Today's Date: 07/08/2014    History of Present Illness Pt is an 81 y.o. Female s/p Left ORIF distal femur for fx sustained in fall at home. Pt with hx of multiple falls at home resulting in fxs. PMH of anorexia, DM, GERD, sarcoidosis, CHF, and previous left hip surgery in 03/2013 for fx.    PT Comments    Noting excellent progress with mobility, especially with keeping weight inside RW with amb, and increasing amb distance and activity tolerance; Will opt to continue amb with KI until otherwise ordered; Overall progressing well; Anticipate continuing good progress at post-acute rehabilitation.   Follow Up Recommendations  SNF;Supervision/Assistance - 24 hour     Equipment Recommendations  None recommended by PT (pretty well-equipped at home)    Recommendations for Other Services       Precautions / Restrictions Precautions Precautions: Fall Required Braces or Orthoses: Knee Immobilizer - Left Restrictions Weight Bearing Restrictions: Yes LLE Weight Bearing: Weight bearing as tolerated (Per PT orders on 3/8)    Mobility  Bed Mobility Overal bed mobility: Needs Assistance Bed Mobility: Supine to Sit     Supine to sit: Min assist     General bed mobility comments: Cues for technique; took extra time, but needing less physical assist; min assist to use bed pad to square off hips at EOB; used bedrails to pull and scoot  Transfers Overall transfer level: Needs assistance Equipment used: Rolling walker (2 wheeled) Transfers: Sit to/from Stand Sit to Stand: Mod assist         General transfer comment: Heavy mod assist to power up; max cues and support to fully extend hips and get weight shift anteriorly as posterior lean continues  Ambulation/Gait Ambulation/Gait assistance: Mod assist;+2 safety/equipment Ambulation Distance (Feet): 22 Feet Assistive device: Rolling walker (2  wheeled) Gait Pattern/deviations: Step-to pattern;Decreased step length - right;Decreased step length - left;Decreased stride length;Trunk flexed;Leaning posteriorly     General Gait Details: Cues for sequence and to bear weight into RW for 50%PWB; heavy mod assist as pt continued with posterior lean, though with good moments wen she was able to more organize her center over the RW   Stairs            Wheelchair Mobility    Modified Rankin (Stroke Patients Only)       Balance Overall balance assessment: History of Falls                                  Cognition Arousal/Alertness: Awake/alert Behavior During Therapy: WFL for tasks assessed/performed Overall Cognitive Status: Within Functional Limits for tasks assessed                      Exercises General Exercises - Lower Extremity Quad Sets: AROM;Left;5 reps Heel Slides: AAROM;Left;5 reps (approx 0-55deg)    General Comments        Pertinent Vitals/Pain Pain Assessment: 0-10 Pain Score: 4  Pain Location: L knee Pain Descriptors / Indicators: Aching Pain Intervention(s): Monitored during session (RN aware)    Home Living                      Prior Function            PT Goals (current goals can now be found in the care plan section) Acute Rehab PT  Goals Patient Stated Goal: to go home PT Goal Formulation: With patient Time For Goal Achievement: 07/20/14 Potential to Achieve Goals: Fair Progress towards PT goals: Progressing toward goals    Frequency  Min 3X/week    PT Plan Current plan remains appropriate    Co-evaluation             End of Session Equipment Utilized During Treatment: Gait belt;Left knee immobilizer Activity Tolerance: Patient tolerated treatment well Patient left: in chair;with call bell/phone within reach     Time: [id number] PT Time Calculation (min) (ACUTE ONLY): 15 min  Charges:  $Gait Training: 8-22 mins                    G  Codes:      Van ClinesGarrigan, Kamren Heintzelman Hamff 07/08/2014, 10:08 AM  Van ClinesHolly Kaydynce Pat, PT  Acute Rehabilitation Services Pager [phone] Office [phone]

## 2014-07-08 NOTE — Progress Notes (Signed)
Subjective: 3 Days Post-Op Procedure(s) (LRB): Left super condylar femur fracture (Left) Patient reports pain as mild.    Objective: Vital signs in last 24 hours: Temp:  [97.8 F (36.6 C)-98.1 F (36.7 C)] 97.9 F (36.6 C) (03/08 0521) Pulse Rate:  [85-100] 85 (03/08 0521) Resp:  [16-18] 16 (03/08 0521) BP: (95-129)/(55-57) 95/55 mmHg (03/08 0521) SpO2:  [95 %-97 %] 96 % (03/08 0521)  Intake/Output from previous day: 03/07 0701 - 03/08 0700 In: 240 [P.O.:240] Out: -  Intake/Output this shift:     Recent Labs  07/05/14 1640 07/06/14 0515 07/06/14 1945 07/07/14 0601 07/08/14 0519  HGB 9.1* 7.6* 11.9* 10.4* 9.8*    Recent Labs  07/07/14 0601 07/08/14 0519  WBC 7.6 6.2  RBC 3.36* 3.23*  HCT 30.3* 29.1*  PLT 109* 114*    Recent Labs  07/07/14 0601 07/08/14 0519  NA 134* 134*  K 3.7 4.1  CL 101 101  CO2 29 30  BUN 15 15  CREATININE 0.60 0.68  GLUCOSE 183* 168*  CALCIUM 7.8* 8.2*    Recent Labs  07/07/14 0601  INR 1.14    Neurologically intact  Assessment/Plan: 3 Days Post-Op Procedure(s) (LRB): Left super condylar femur fracture (Left) Plan    Dressing change,   Ready for SNF from Orthopedic standpoint. ROV one week  Misty [name] C 07/08/2014, 7:28 AM

## 2014-07-08 NOTE — Discharge Planning (Signed)
Patient will discharge today per MD order. [facility] RN to call report prior to transportation to [phone] Transportation: [facility]  CSW sent discharge summary to SNF for review.  Packet is complete.  RN, patient and family aware of discharge plans.  Vickii PennaGina Catricia Scheerer, LCSWA [phone]  Psychiatric & Orthopedics (5N 1-16) Clinical Social Worker

## 2014-07-17 DIAGNOSIS — S72452D Displaced supracondylar fracture without intracondylar extension of lower end of left femur, subsequent encounter for closed fracture with routine healing: Secondary | ICD-10-CM | POA: Diagnosis not present

## 2014-07-20 DIAGNOSIS — J8410 Pulmonary fibrosis, unspecified: Secondary | ICD-10-CM | POA: Diagnosis not present

## 2014-07-20 DIAGNOSIS — D869 Sarcoidosis, unspecified: Secondary | ICD-10-CM | POA: Diagnosis not present

## 2014-07-20 DIAGNOSIS — G8918 Other acute postprocedural pain: Secondary | ICD-10-CM | POA: Diagnosis not present

## 2014-07-20 DIAGNOSIS — E1165 Type 2 diabetes mellitus with hyperglycemia: Secondary | ICD-10-CM | POA: Diagnosis not present

## 2014-07-20 DIAGNOSIS — E039 Hypothyroidism, unspecified: Secondary | ICD-10-CM | POA: Diagnosis not present

## 2014-07-20 DIAGNOSIS — K449 Diaphragmatic hernia without obstruction or gangrene: Secondary | ICD-10-CM | POA: Diagnosis not present

## 2014-07-20 DIAGNOSIS — M79652 Pain in left thigh: Secondary | ICD-10-CM | POA: Diagnosis not present

## 2014-07-20 DIAGNOSIS — R739 Hyperglycemia, unspecified: Secondary | ICD-10-CM | POA: Diagnosis not present

## 2014-07-20 DIAGNOSIS — S72452D Displaced supracondylar fracture without intracondylar extension of lower end of left femur, subsequent encounter for closed fracture with routine healing: Secondary | ICD-10-CM | POA: Diagnosis not present

## 2014-07-20 DIAGNOSIS — Z7982 Long term (current) use of aspirin: Secondary | ICD-10-CM | POA: Diagnosis not present

## 2014-07-20 DIAGNOSIS — J984 Other disorders of lung: Secondary | ICD-10-CM | POA: Diagnosis not present

## 2014-07-20 DIAGNOSIS — Z9889 Other specified postprocedural states: Secondary | ICD-10-CM | POA: Diagnosis not present

## 2014-07-21 DIAGNOSIS — M7989 Other specified soft tissue disorders: Secondary | ICD-10-CM | POA: Diagnosis not present

## 2014-07-21 DIAGNOSIS — E1165 Type 2 diabetes mellitus with hyperglycemia: Secondary | ICD-10-CM | POA: Diagnosis not present

## 2014-07-21 DIAGNOSIS — M79605 Pain in left leg: Secondary | ICD-10-CM | POA: Diagnosis not present

## 2014-07-21 DIAGNOSIS — R2242 Localized swelling, mass and lump, left lower limb: Secondary | ICD-10-CM | POA: Diagnosis not present

## 2014-08-17 DIAGNOSIS — E1165 Type 2 diabetes mellitus with hyperglycemia: Secondary | ICD-10-CM | POA: Diagnosis not present

## 2014-08-21 DIAGNOSIS — S72452S Displaced supracondylar fracture without intracondylar extension of lower end of left femur, sequela: Secondary | ICD-10-CM | POA: Diagnosis not present

## 2014-08-31 DIAGNOSIS — K831 Obstruction of bile duct: Secondary | ICD-10-CM | POA: Diagnosis not present

## 2014-08-31 DIAGNOSIS — R278 Other lack of coordination: Secondary | ICD-10-CM | POA: Diagnosis not present

## 2014-08-31 DIAGNOSIS — F419 Anxiety disorder, unspecified: Secondary | ICD-10-CM | POA: Diagnosis not present

## 2014-08-31 DIAGNOSIS — D869 Sarcoidosis, unspecified: Secondary | ICD-10-CM | POA: Diagnosis not present

## 2014-08-31 DIAGNOSIS — S72452S Displaced supracondylar fracture without intracondylar extension of lower end of left femur, sequela: Secondary | ICD-10-CM | POA: Diagnosis not present

## 2014-08-31 DIAGNOSIS — D62 Acute posthemorrhagic anemia: Secondary | ICD-10-CM | POA: Diagnosis not present

## 2014-08-31 DIAGNOSIS — R262 Difficulty in walking, not elsewhere classified: Secondary | ICD-10-CM | POA: Diagnosis not present

## 2014-08-31 DIAGNOSIS — E1165 Type 2 diabetes mellitus with hyperglycemia: Secondary | ICD-10-CM | POA: Diagnosis not present

## 2014-08-31 DIAGNOSIS — E119 Type 2 diabetes mellitus without complications: Secondary | ICD-10-CM | POA: Diagnosis not present

## 2014-08-31 DIAGNOSIS — M6281 Muscle weakness (generalized): Secondary | ICD-10-CM | POA: Diagnosis not present

## 2014-08-31 DIAGNOSIS — Z4789 Encounter for other orthopedic aftercare: Secondary | ICD-10-CM | POA: Diagnosis not present

## 2014-08-31 DIAGNOSIS — Z9181 History of falling: Secondary | ICD-10-CM | POA: Diagnosis not present

## 2014-08-31 DIAGNOSIS — E871 Hypo-osmolality and hyponatremia: Secondary | ICD-10-CM | POA: Diagnosis not present

## 2014-08-31 DIAGNOSIS — M1990 Unspecified osteoarthritis, unspecified site: Secondary | ICD-10-CM | POA: Diagnosis not present

## 2014-08-31 DIAGNOSIS — K219 Gastro-esophageal reflux disease without esophagitis: Secondary | ICD-10-CM | POA: Diagnosis not present

## 2014-09-18 DIAGNOSIS — S72452S Displaced supracondylar fracture without intracondylar extension of lower end of left femur, sequela: Secondary | ICD-10-CM | POA: Diagnosis not present

## 2014-10-10 DIAGNOSIS — E1165 Type 2 diabetes mellitus with hyperglycemia: Secondary | ICD-10-CM | POA: Diagnosis not present

## 2014-10-16 DIAGNOSIS — K219 Gastro-esophageal reflux disease without esophagitis: Secondary | ICD-10-CM | POA: Diagnosis not present

## 2014-10-16 DIAGNOSIS — Z9181 History of falling: Secondary | ICD-10-CM | POA: Diagnosis not present

## 2014-10-16 DIAGNOSIS — E119 Type 2 diabetes mellitus without complications: Secondary | ICD-10-CM | POA: Diagnosis not present

## 2014-10-16 DIAGNOSIS — M150 Primary generalized (osteo)arthritis: Secondary | ICD-10-CM | POA: Diagnosis not present

## 2014-10-16 DIAGNOSIS — E039 Hypothyroidism, unspecified: Secondary | ICD-10-CM | POA: Diagnosis not present

## 2014-10-16 DIAGNOSIS — M810 Age-related osteoporosis without current pathological fracture: Secondary | ICD-10-CM | POA: Diagnosis not present

## 2014-10-16 DIAGNOSIS — S72002D Fracture of unspecified part of neck of left femur, subsequent encounter for closed fracture with routine healing: Secondary | ICD-10-CM | POA: Diagnosis not present

## 2014-10-16 DIAGNOSIS — D860 Sarcoidosis of lung: Secondary | ICD-10-CM | POA: Diagnosis not present

## 2014-10-20 DIAGNOSIS — M810 Age-related osteoporosis without current pathological fracture: Secondary | ICD-10-CM | POA: Diagnosis not present

## 2014-10-20 DIAGNOSIS — E039 Hypothyroidism, unspecified: Secondary | ICD-10-CM | POA: Diagnosis not present

## 2014-10-20 DIAGNOSIS — M150 Primary generalized (osteo)arthritis: Secondary | ICD-10-CM | POA: Diagnosis not present

## 2014-10-20 DIAGNOSIS — E119 Type 2 diabetes mellitus without complications: Secondary | ICD-10-CM | POA: Diagnosis not present

## 2014-10-20 DIAGNOSIS — D860 Sarcoidosis of lung: Secondary | ICD-10-CM | POA: Diagnosis not present

## 2014-10-20 DIAGNOSIS — S72002D Fracture of unspecified part of neck of left femur, subsequent encounter for closed fracture with routine healing: Secondary | ICD-10-CM | POA: Diagnosis not present

## 2014-10-21 DIAGNOSIS — E119 Type 2 diabetes mellitus without complications: Secondary | ICD-10-CM | POA: Diagnosis not present

## 2014-10-21 DIAGNOSIS — M150 Primary generalized (osteo)arthritis: Secondary | ICD-10-CM | POA: Diagnosis not present

## 2014-10-21 DIAGNOSIS — S72002D Fracture of unspecified part of neck of left femur, subsequent encounter for closed fracture with routine healing: Secondary | ICD-10-CM | POA: Diagnosis not present

## 2014-10-21 DIAGNOSIS — D860 Sarcoidosis of lung: Secondary | ICD-10-CM | POA: Diagnosis not present

## 2014-10-21 DIAGNOSIS — E039 Hypothyroidism, unspecified: Secondary | ICD-10-CM | POA: Diagnosis not present

## 2014-10-21 DIAGNOSIS — M810 Age-related osteoporosis without current pathological fracture: Secondary | ICD-10-CM | POA: Diagnosis not present

## 2014-10-23 DIAGNOSIS — M810 Age-related osteoporosis without current pathological fracture: Secondary | ICD-10-CM | POA: Diagnosis not present

## 2014-10-23 DIAGNOSIS — E119 Type 2 diabetes mellitus without complications: Secondary | ICD-10-CM | POA: Diagnosis not present

## 2014-10-23 DIAGNOSIS — M150 Primary generalized (osteo)arthritis: Secondary | ICD-10-CM | POA: Diagnosis not present

## 2014-10-23 DIAGNOSIS — S72002D Fracture of unspecified part of neck of left femur, subsequent encounter for closed fracture with routine healing: Secondary | ICD-10-CM | POA: Diagnosis not present

## 2014-10-23 DIAGNOSIS — E039 Hypothyroidism, unspecified: Secondary | ICD-10-CM | POA: Diagnosis not present

## 2014-10-23 DIAGNOSIS — D860 Sarcoidosis of lung: Secondary | ICD-10-CM | POA: Diagnosis not present

## 2014-10-24 DIAGNOSIS — D860 Sarcoidosis of lung: Secondary | ICD-10-CM | POA: Diagnosis not present

## 2014-10-24 DIAGNOSIS — E039 Hypothyroidism, unspecified: Secondary | ICD-10-CM | POA: Diagnosis not present

## 2014-10-24 DIAGNOSIS — M810 Age-related osteoporosis without current pathological fracture: Secondary | ICD-10-CM | POA: Diagnosis not present

## 2014-10-24 DIAGNOSIS — S72002D Fracture of unspecified part of neck of left femur, subsequent encounter for closed fracture with routine healing: Secondary | ICD-10-CM | POA: Diagnosis not present

## 2014-10-24 DIAGNOSIS — M150 Primary generalized (osteo)arthritis: Secondary | ICD-10-CM | POA: Diagnosis not present

## 2014-10-24 DIAGNOSIS — E119 Type 2 diabetes mellitus without complications: Secondary | ICD-10-CM | POA: Diagnosis not present

## 2014-10-27 DIAGNOSIS — S72002D Fracture of unspecified part of neck of left femur, subsequent encounter for closed fracture with routine healing: Secondary | ICD-10-CM | POA: Diagnosis not present

## 2014-10-27 DIAGNOSIS — M150 Primary generalized (osteo)arthritis: Secondary | ICD-10-CM | POA: Diagnosis not present

## 2014-10-27 DIAGNOSIS — E039 Hypothyroidism, unspecified: Secondary | ICD-10-CM | POA: Diagnosis not present

## 2014-10-27 DIAGNOSIS — E119 Type 2 diabetes mellitus without complications: Secondary | ICD-10-CM | POA: Diagnosis not present

## 2014-10-27 DIAGNOSIS — M810 Age-related osteoporosis without current pathological fracture: Secondary | ICD-10-CM | POA: Diagnosis not present

## 2014-10-27 DIAGNOSIS — D860 Sarcoidosis of lung: Secondary | ICD-10-CM | POA: Diagnosis not present

## 2014-10-28 DIAGNOSIS — E119 Type 2 diabetes mellitus without complications: Secondary | ICD-10-CM | POA: Diagnosis not present

## 2014-10-28 DIAGNOSIS — D860 Sarcoidosis of lung: Secondary | ICD-10-CM | POA: Diagnosis not present

## 2014-10-28 DIAGNOSIS — E039 Hypothyroidism, unspecified: Secondary | ICD-10-CM | POA: Diagnosis not present

## 2014-10-28 DIAGNOSIS — M150 Primary generalized (osteo)arthritis: Secondary | ICD-10-CM | POA: Diagnosis not present

## 2014-10-28 DIAGNOSIS — M810 Age-related osteoporosis without current pathological fracture: Secondary | ICD-10-CM | POA: Diagnosis not present

## 2014-10-28 DIAGNOSIS — S72002D Fracture of unspecified part of neck of left femur, subsequent encounter for closed fracture with routine healing: Secondary | ICD-10-CM | POA: Diagnosis not present

## 2014-10-29 DIAGNOSIS — E039 Hypothyroidism, unspecified: Secondary | ICD-10-CM | POA: Diagnosis not present

## 2014-10-29 DIAGNOSIS — D860 Sarcoidosis of lung: Secondary | ICD-10-CM | POA: Diagnosis not present

## 2014-10-29 DIAGNOSIS — S72002D Fracture of unspecified part of neck of left femur, subsequent encounter for closed fracture with routine healing: Secondary | ICD-10-CM | POA: Diagnosis not present

## 2014-10-29 DIAGNOSIS — M810 Age-related osteoporosis without current pathological fracture: Secondary | ICD-10-CM | POA: Diagnosis not present

## 2014-10-29 DIAGNOSIS — E119 Type 2 diabetes mellitus without complications: Secondary | ICD-10-CM | POA: Diagnosis not present

## 2014-10-29 DIAGNOSIS — M150 Primary generalized (osteo)arthritis: Secondary | ICD-10-CM | POA: Diagnosis not present

## 2014-10-31 DIAGNOSIS — M150 Primary generalized (osteo)arthritis: Secondary | ICD-10-CM | POA: Diagnosis not present

## 2014-10-31 DIAGNOSIS — D860 Sarcoidosis of lung: Secondary | ICD-10-CM | POA: Diagnosis not present

## 2014-10-31 DIAGNOSIS — E119 Type 2 diabetes mellitus without complications: Secondary | ICD-10-CM | POA: Diagnosis not present

## 2014-10-31 DIAGNOSIS — S72002D Fracture of unspecified part of neck of left femur, subsequent encounter for closed fracture with routine healing: Secondary | ICD-10-CM | POA: Diagnosis not present

## 2014-10-31 DIAGNOSIS — M810 Age-related osteoporosis without current pathological fracture: Secondary | ICD-10-CM | POA: Diagnosis not present

## 2014-10-31 DIAGNOSIS — E039 Hypothyroidism, unspecified: Secondary | ICD-10-CM | POA: Diagnosis not present

## 2014-11-03 DIAGNOSIS — D860 Sarcoidosis of lung: Secondary | ICD-10-CM | POA: Diagnosis not present

## 2014-11-03 DIAGNOSIS — M150 Primary generalized (osteo)arthritis: Secondary | ICD-10-CM | POA: Diagnosis not present

## 2014-11-03 DIAGNOSIS — M810 Age-related osteoporosis without current pathological fracture: Secondary | ICD-10-CM | POA: Diagnosis not present

## 2014-11-03 DIAGNOSIS — E039 Hypothyroidism, unspecified: Secondary | ICD-10-CM | POA: Diagnosis not present

## 2014-11-03 DIAGNOSIS — E119 Type 2 diabetes mellitus without complications: Secondary | ICD-10-CM | POA: Diagnosis not present

## 2014-11-03 DIAGNOSIS — S72002D Fracture of unspecified part of neck of left femur, subsequent encounter for closed fracture with routine healing: Secondary | ICD-10-CM | POA: Diagnosis not present

## 2014-11-04 DIAGNOSIS — M810 Age-related osteoporosis without current pathological fracture: Secondary | ICD-10-CM | POA: Diagnosis not present

## 2014-11-04 DIAGNOSIS — M150 Primary generalized (osteo)arthritis: Secondary | ICD-10-CM | POA: Diagnosis not present

## 2014-11-04 DIAGNOSIS — S72002D Fracture of unspecified part of neck of left femur, subsequent encounter for closed fracture with routine healing: Secondary | ICD-10-CM | POA: Diagnosis not present

## 2014-11-04 DIAGNOSIS — E119 Type 2 diabetes mellitus without complications: Secondary | ICD-10-CM | POA: Diagnosis not present

## 2014-11-04 DIAGNOSIS — D860 Sarcoidosis of lung: Secondary | ICD-10-CM | POA: Diagnosis not present

## 2014-11-04 DIAGNOSIS — E039 Hypothyroidism, unspecified: Secondary | ICD-10-CM | POA: Diagnosis not present

## 2014-11-05 DIAGNOSIS — M810 Age-related osteoporosis without current pathological fracture: Secondary | ICD-10-CM | POA: Diagnosis not present

## 2014-11-05 DIAGNOSIS — M150 Primary generalized (osteo)arthritis: Secondary | ICD-10-CM | POA: Diagnosis not present

## 2014-11-05 DIAGNOSIS — S72002D Fracture of unspecified part of neck of left femur, subsequent encounter for closed fracture with routine healing: Secondary | ICD-10-CM | POA: Diagnosis not present

## 2014-11-05 DIAGNOSIS — D860 Sarcoidosis of lung: Secondary | ICD-10-CM | POA: Diagnosis not present

## 2014-11-05 DIAGNOSIS — E039 Hypothyroidism, unspecified: Secondary | ICD-10-CM | POA: Diagnosis not present

## 2014-11-05 DIAGNOSIS — E119 Type 2 diabetes mellitus without complications: Secondary | ICD-10-CM | POA: Diagnosis not present

## 2014-11-07 DIAGNOSIS — M810 Age-related osteoporosis without current pathological fracture: Secondary | ICD-10-CM | POA: Diagnosis not present

## 2014-11-07 DIAGNOSIS — M150 Primary generalized (osteo)arthritis: Secondary | ICD-10-CM | POA: Diagnosis not present

## 2014-11-07 DIAGNOSIS — E039 Hypothyroidism, unspecified: Secondary | ICD-10-CM | POA: Diagnosis not present

## 2014-11-07 DIAGNOSIS — E119 Type 2 diabetes mellitus without complications: Secondary | ICD-10-CM | POA: Diagnosis not present

## 2014-11-07 DIAGNOSIS — S72002D Fracture of unspecified part of neck of left femur, subsequent encounter for closed fracture with routine healing: Secondary | ICD-10-CM | POA: Diagnosis not present

## 2014-11-07 DIAGNOSIS — D860 Sarcoidosis of lung: Secondary | ICD-10-CM | POA: Diagnosis not present

## 2014-11-11 ENCOUNTER — Encounter (INDEPENDENT_AMBULATORY_CARE_PROVIDER_SITE_OTHER): Payer: Self-pay | Admitting: *Deleted

## 2014-11-13 DIAGNOSIS — M150 Primary generalized (osteo)arthritis: Secondary | ICD-10-CM | POA: Diagnosis not present

## 2014-11-13 DIAGNOSIS — E119 Type 2 diabetes mellitus without complications: Secondary | ICD-10-CM | POA: Diagnosis not present

## 2014-11-13 DIAGNOSIS — D860 Sarcoidosis of lung: Secondary | ICD-10-CM | POA: Diagnosis not present

## 2014-11-13 DIAGNOSIS — E039 Hypothyroidism, unspecified: Secondary | ICD-10-CM | POA: Diagnosis not present

## 2014-11-13 DIAGNOSIS — S72002D Fracture of unspecified part of neck of left femur, subsequent encounter for closed fracture with routine healing: Secondary | ICD-10-CM | POA: Diagnosis not present

## 2014-11-13 DIAGNOSIS — M810 Age-related osteoporosis without current pathological fracture: Secondary | ICD-10-CM | POA: Diagnosis not present

## 2014-11-14 DIAGNOSIS — E1165 Type 2 diabetes mellitus with hyperglycemia: Secondary | ICD-10-CM | POA: Diagnosis not present

## 2014-11-14 DIAGNOSIS — E782 Mixed hyperlipidemia: Secondary | ICD-10-CM | POA: Diagnosis not present

## 2014-11-14 DIAGNOSIS — E039 Hypothyroidism, unspecified: Secondary | ICD-10-CM | POA: Diagnosis not present

## 2014-11-20 DIAGNOSIS — E119 Type 2 diabetes mellitus without complications: Secondary | ICD-10-CM | POA: Diagnosis not present

## 2014-11-20 DIAGNOSIS — D860 Sarcoidosis of lung: Secondary | ICD-10-CM | POA: Diagnosis not present

## 2014-11-20 DIAGNOSIS — M810 Age-related osteoporosis without current pathological fracture: Secondary | ICD-10-CM | POA: Diagnosis not present

## 2014-11-20 DIAGNOSIS — E039 Hypothyroidism, unspecified: Secondary | ICD-10-CM | POA: Diagnosis not present

## 2014-11-20 DIAGNOSIS — S72002D Fracture of unspecified part of neck of left femur, subsequent encounter for closed fracture with routine healing: Secondary | ICD-10-CM | POA: Diagnosis not present

## 2014-11-20 DIAGNOSIS — M150 Primary generalized (osteo)arthritis: Secondary | ICD-10-CM | POA: Diagnosis not present

## 2014-12-31 ENCOUNTER — Other Ambulatory Visit (INDEPENDENT_AMBULATORY_CARE_PROVIDER_SITE_OTHER): Payer: Self-pay | Admitting: Internal Medicine

## 2015-02-12 ENCOUNTER — Ambulatory Visit (INDEPENDENT_AMBULATORY_CARE_PROVIDER_SITE_OTHER): Payer: Medicare Other | Admitting: Internal Medicine

## 2015-03-20 DIAGNOSIS — R5383 Other fatigue: Secondary | ICD-10-CM | POA: Diagnosis not present

## 2015-03-20 DIAGNOSIS — E782 Mixed hyperlipidemia: Secondary | ICD-10-CM | POA: Diagnosis not present

## 2015-03-20 DIAGNOSIS — E039 Hypothyroidism, unspecified: Secondary | ICD-10-CM | POA: Diagnosis not present

## 2015-03-20 DIAGNOSIS — E1165 Type 2 diabetes mellitus with hyperglycemia: Secondary | ICD-10-CM | POA: Diagnosis not present

## 2015-03-27 DIAGNOSIS — E039 Hypothyroidism, unspecified: Secondary | ICD-10-CM | POA: Diagnosis not present

## 2015-03-27 DIAGNOSIS — E1165 Type 2 diabetes mellitus with hyperglycemia: Secondary | ICD-10-CM | POA: Diagnosis not present

## 2015-03-27 DIAGNOSIS — E782 Mixed hyperlipidemia: Secondary | ICD-10-CM | POA: Diagnosis not present

## 2015-03-27 DIAGNOSIS — H6122 Impacted cerumen, left ear: Secondary | ICD-10-CM | POA: Diagnosis not present

## 2015-04-13 ENCOUNTER — Ambulatory Visit (INDEPENDENT_AMBULATORY_CARE_PROVIDER_SITE_OTHER): Payer: Medicare Other | Admitting: Internal Medicine

## 2015-04-13 ENCOUNTER — Encounter (INDEPENDENT_AMBULATORY_CARE_PROVIDER_SITE_OTHER): Payer: Self-pay | Admitting: Internal Medicine

## 2015-04-13 VITALS — BP 122/82 | HR 88 | Temp 98.0°F | Ht 60.0 in | Wt 76.5 lb

## 2015-04-13 DIAGNOSIS — K831 Obstruction of bile duct: Secondary | ICD-10-CM | POA: Diagnosis not present

## 2015-04-13 NOTE — Patient Instructions (Addendum)
Hepatic function today. OV in 1 year. 

## 2015-04-13 NOTE — Progress Notes (Signed)
Subjective:    Patient ID: Misty [name], female    DOB: 01/21/1930, 81 y.o.   MRN: [id number] PCP: Dr. Rosann Auerbach HPI Here today for follow up. She was last seen by Dr. Karilyn Cota in October of 2015. She tells me her constipation is better. She is eating fruits. She says she doesn't take the Miralax as needed. She usually has a BM every. No melena or BRRB. Her appetite is okay. Really no change in appetite. She has lost 7.5 pounds since her last visit in October of 2015. She denies any abdominal pain. No dysphagia. She denies acid reflux and has stopped the Protonix. 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   07/02/2014  Open reduction of left femur fracture. She sat she sat down in the floor (fall) and she said her femur broke. She is in a w/ch in office today. She spent 3 months in Rehab at [facility]   CMP Latest Ref Rng 07/08/2014 07/07/2014 07/06/2014  Glucose 70 - 99 mg/dL 147(W) 295(A) 213(Y)  BUN 6 - 23 mg/dL Creatinine 0.50 - 1.10 mg/dL 8.65 7.84 6.96  Sodium 135 - 145 mmol/L 134(L) 134(L) 133(L)  Potassium 3.5 - 5.1 mmol/L 4.1 3.7 4.3  Chloride 96 - 112 mmol/L 101 101 99  CO2 19 - 32 mmol/L Calcium 8.4 - 10.5 mg/dL 8.2(L) 7.8(L) 8.6  Total Protein 6.0 - 8.3 g/dL - 5.2(L) -  Total Bilirubin 0.3 - 1.2 mg/dL - 2.0(H) -  Alkaline Phos 39 - 117 U/L - 39 -  AST 0 - 37 U/L - 28 -  ALT 0 - 35 U/L - 12 -      Review of Systems Past Medical History  Diagnosis Date  . Anorexia   . Weight loss   . Jaundice   . Biliary obstruction   . Hypothyroidism   . Diabetes mellitus   . GERD (gastroesophageal reflux disease)   . Sarcoidosis (HCC)   . Congestive heart failure [facility]) December, 2012    [facility]    Past Surgical History  Procedure Laterality Date  . Vagina surgery  as a teen  . Ercp  02/16/2011     Procedure: ENDOSCOPIC RETROGRADE CHOLANGIOPANCREATOGRAPHY (ERCP);  Surgeon: Malissa Hippo, MD;  Location: [facility] ORS;  Service: Endoscopy;  Laterality: N/A;  7:30/with Stent Removal  . Biliary stent placement  02/16/2011    Procedure: BILIARY STENT PLACEMENT;  Surgeon: Malissa Hippo, MD;  Location: [facility] ORS;  Service: Endoscopy;  Laterality: N/A;  . Sphincterotomy  02/16/2011    Procedure: SPHINCTEROTOMY;  Surgeon: Malissa Hippo, MD;  Location: [facility] ORS;  Service: Endoscopy;  Laterality: N/A;  . Ercp  10/08/2010  . Appendectomy    . Ercp  04/20/2011    Procedure: ENDOSCOPIC RETROGRADE CHOLANGIOPANCREATOGRAPHY (ERCP);  Surgeon: Malissa Hippo, MD;  Location: [facility] ORS;  Service: Endoscopy;  Laterality: N/A;  removal of stent, removal of multiple stones with basket  . Spyglass cholangioscopy  04/20/2011    Procedure: SPYGLASS CHOLANGIOSCOPY;  Surgeon: Malissa Hippo, MD;  Location: [facility] ORS;  Service: Endoscopy;  Laterality: N/A;  . Biliary stent placement  04/20/2011    Procedure: BILIARY STENT PLACEMENT;  Surgeon: Malissa Hippo, MD;  Location: [facility] ORS;  Service: Endoscopy;  Laterality: N/A;  . Cholecystectomy  04/27/2011  Procedure: LAPAROSCOPIC CHOLECYSTECTOMY WITH INTRAOPERATIVE CHOLANGIOGRAM;  Surgeon: Fabio BeringBrent C Ziegler, MD;  Location: [facility] ORS;  Service: General;  Laterality: N/A;  . Ercp  09/14/2011    Procedure: ENDOSCOPIC RETROGRADE CHOLANGIOPANCREATOGRAPHY (ERCP);  Surgeon: Malissa HippoNajeeb U Rehman, MD;  Location: [facility] ORS;  Service: Endoscopy;  Laterality: N/A;  ERCP with Stent Removal  . Spyglass cholangioscopy  09/14/2011    Procedure: SPYGLASS CHOLANGIOSCOPY;  Surgeon: Malissa HippoNajeeb U Rehman, MD;  Location: [facility] ORS;  Service: Endoscopy;  Laterality: N/A;  . Sphincterotomy  09/14/2011    Procedure: SPHINCTEROTOMY;  Surgeon: Malissa HippoNajeeb U Rehman, MD;  Location: [facility] ORS;  Service: Endoscopy;  Laterality: N/A;  . Hip surgery Left November 20.2014    Left Hip Fracture- Done @ [facility]  . Orif femur fracture Left  07/05/2014    Procedure: Left super condylar femur fracture;  Surgeon: Eldred MangesMark C Yates, MD;  [facility];  Service: Orthopedics;  Laterality: Left;    Allergies  Allergen Reactions  . Nitrofuran Derivatives Hives  . Sulfa Antibiotics Hives and Swelling    Current Outpatient Prescriptions on File Prior to Visit  Medication Sig Dispense Refill  . Amino Acids-Protein Hydrolys (FEEDING SUPPLEMENT, PRO-STAT SUGAR FREE 64,) LIQD Take 30 mLs by mouth daily. (Patient taking differently: Take 30 mLs by mouth as needed. ) 900 mL 0  . diclofenac sodium (VOLTAREN) 1 % GEL Apply 1 application topically 4 (four) times daily. For arthritis pain    . feeding supplement, GLUCERNA SHAKE, (GLUCERNA SHAKE) LIQD Take 237 mLs by mouth 2 (two) times daily between meals. 30 Can 3  . levothyroxine (SYNTHROID, LEVOTHROID) 100 MCG tablet Take 100 mcg by mouth daily before breakfast.    . polyethylene glycol (MIRALAX / GLYCOLAX) packet Take 17 g by mouth as needed.    . traMADol (ULTRAM) 50 MG tablet Take 1 tablet (50 mg total) by mouth every 6 (six) hours as needed for moderate pain. 30 tablet 0  . ursodiol (ACTIGALL) 300 MG capsule TAKE 1 CAPSULE BY MOUTH DAILY. 30 capsule 11   No current facility-administered medications on file prior to visit.        Objective:   Physical ExamBlood pressure 122/82, pulse 88, temperature 98 F (36.7 C), height 5' (1.524 m), weight 76 lb 8 oz (34.7 kg).  Alert and oriented. Skin warm and dry. Oral mucosa is moist.   . Sclera anicteric, conjunctivae is pink. Thyroid not enlarged. No cervical lymphadenopathy. Lungs clear. Heart regular rate and rhythm.  Abdomen is soft. Bowel sounds are positive. No hepatomegaly. No abdominal masses felt. No tenderness.  No edema to lower extremities.         Assessment & Plan:    #1. 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 She underwent cholecystectomy in December 2012 and diuresed and was removed in May 2013. She has very large tortuous bile duct at risk for stasis and on her last ERCP of May 2013 she had multiple common duct stones removed as well.. She has no biliary symptoms. . #2. Chronic constipation which has improved. Takes Miralax as needed. She is having BM daily. Encouraged to drink Boost or other supplements.   Hepatic function today. OV in 1 year.

## 2015-04-14 ENCOUNTER — Telehealth (INDEPENDENT_AMBULATORY_CARE_PROVIDER_SITE_OTHER): Payer: Self-pay | Admitting: Internal Medicine

## 2015-04-14 LAB — HEPATIC FUNCTION PANEL
ALT: 8 U/L (ref 6–29)
AST: 18 U/L (ref 10–35)
Albumin: 4 g/dL (ref 3.6–5.1)
Alkaline Phosphatase: 60 U/L (ref 33–130)
BILIRUBIN INDIRECT: 0.4 mg/dL (ref 0.2–1.2)
Bilirubin, Direct: 0.2 mg/dL (ref <=0.2)
TOTAL PROTEIN: 6.6 g/dL (ref 6.1–8.1)
Total Bilirubin: 0.6 mg/dL (ref 0.2–1.2)

## 2015-04-15 DIAGNOSIS — E1140 Type 2 diabetes mellitus with diabetic neuropathy, unspecified: Secondary | ICD-10-CM | POA: Diagnosis not present

## 2015-04-15 DIAGNOSIS — L110 Acquired keratosis follicularis: Secondary | ICD-10-CM | POA: Diagnosis not present

## 2015-04-15 DIAGNOSIS — E1142 Type 2 diabetes mellitus with diabetic polyneuropathy: Secondary | ICD-10-CM | POA: Diagnosis not present

## 2015-04-23 NOTE — Telephone Encounter (Signed)
error 

## 2015-06-22 DIAGNOSIS — J209 Acute bronchitis, unspecified: Secondary | ICD-10-CM | POA: Diagnosis not present

## 2015-06-22 DIAGNOSIS — R05 Cough: Secondary | ICD-10-CM | POA: Diagnosis not present

## 2015-06-24 DIAGNOSIS — Z823 Family history of stroke: Secondary | ICD-10-CM | POA: Diagnosis not present

## 2015-06-24 DIAGNOSIS — M1990 Unspecified osteoarthritis, unspecified site: Secondary | ICD-10-CM | POA: Diagnosis present

## 2015-06-24 DIAGNOSIS — K831 Obstruction of bile duct: Secondary | ICD-10-CM | POA: Diagnosis present

## 2015-06-24 DIAGNOSIS — J441 Chronic obstructive pulmonary disease with (acute) exacerbation: Secondary | ICD-10-CM | POA: Diagnosis not present

## 2015-06-24 DIAGNOSIS — E039 Hypothyroidism, unspecified: Secondary | ICD-10-CM | POA: Diagnosis present

## 2015-06-24 DIAGNOSIS — R0902 Hypoxemia: Secondary | ICD-10-CM | POA: Diagnosis not present

## 2015-06-24 DIAGNOSIS — Z888 Allergy status to other drugs, medicaments and biological substances status: Secondary | ICD-10-CM | POA: Diagnosis not present

## 2015-06-24 DIAGNOSIS — J440 Chronic obstructive pulmonary disease with acute lower respiratory infection: Secondary | ICD-10-CM | POA: Diagnosis not present

## 2015-06-24 DIAGNOSIS — Z79899 Other long term (current) drug therapy: Secondary | ICD-10-CM | POA: Diagnosis not present

## 2015-06-24 DIAGNOSIS — F419 Anxiety disorder, unspecified: Secondary | ICD-10-CM | POA: Diagnosis present

## 2015-06-24 DIAGNOSIS — R509 Fever, unspecified: Secondary | ICD-10-CM | POA: Diagnosis not present

## 2015-06-24 DIAGNOSIS — Z8249 Family history of ischemic heart disease and other diseases of the circulatory system: Secondary | ICD-10-CM | POA: Diagnosis not present

## 2015-06-24 DIAGNOSIS — J9601 Acute respiratory failure with hypoxia: Secondary | ICD-10-CM | POA: Diagnosis present

## 2015-06-24 DIAGNOSIS — R05 Cough: Secondary | ICD-10-CM | POA: Diagnosis not present

## 2015-06-24 DIAGNOSIS — E119 Type 2 diabetes mellitus without complications: Secondary | ICD-10-CM | POA: Diagnosis present

## 2015-06-24 DIAGNOSIS — Z23 Encounter for immunization: Secondary | ICD-10-CM | POA: Diagnosis not present

## 2015-06-24 DIAGNOSIS — J209 Acute bronchitis, unspecified: Secondary | ICD-10-CM | POA: Diagnosis not present

## 2015-06-24 DIAGNOSIS — D869 Sarcoidosis, unspecified: Secondary | ICD-10-CM | POA: Diagnosis not present

## 2015-07-23 ENCOUNTER — Telehealth (INDEPENDENT_AMBULATORY_CARE_PROVIDER_SITE_OTHER): Payer: Self-pay | Admitting: Internal Medicine

## 2015-07-23 NOTE — Telephone Encounter (Signed)
Trey PaulaJeff, the husband of Mrs. Orson SlickBowman called saying he was told by [facility] they've faxed a request for a refill and prior authorization information for the medication Ursodiol and haven't received a response from the office. Mr. Orson SlickBowman is upset this hasn't been taken care of and wants to know if the insurance carrier will or won't cover the cost of the medication. He said he expects a return phone call today.  Pt's ph# [phone] Thank you.

## 2015-07-23 NOTE — Telephone Encounter (Signed)
Patient was previously in Uro 300 mg. Insurance denied, they will cover the  Urso 250 mg - or the 500 mg. Per Dr.Rehman she may take 250 mg by mouth twice daily.  A [facility] was [facility] to [facility] for the following. Ursodiol 250 mg - take 1 by mouth twice daily. #60 11 refills. This was covered for a copay of $3.50.  Patient's son , Trey PaulaJeff was called and made aware.

## 2015-07-27 DIAGNOSIS — J9601 Acute respiratory failure with hypoxia: Secondary | ICD-10-CM | POA: Diagnosis not present

## 2015-07-27 DIAGNOSIS — J449 Chronic obstructive pulmonary disease, unspecified: Secondary | ICD-10-CM | POA: Diagnosis not present

## 2015-08-18 DIAGNOSIS — E039 Hypothyroidism, unspecified: Secondary | ICD-10-CM | POA: Diagnosis not present

## 2015-08-18 DIAGNOSIS — E1165 Type 2 diabetes mellitus with hyperglycemia: Secondary | ICD-10-CM | POA: Diagnosis not present

## 2015-08-18 DIAGNOSIS — J449 Chronic obstructive pulmonary disease, unspecified: Secondary | ICD-10-CM | POA: Diagnosis not present

## 2015-08-18 DIAGNOSIS — E782 Mixed hyperlipidemia: Secondary | ICD-10-CM | POA: Diagnosis not present

## 2015-08-27 DIAGNOSIS — J449 Chronic obstructive pulmonary disease, unspecified: Secondary | ICD-10-CM | POA: Diagnosis not present

## 2015-08-27 DIAGNOSIS — E1165 Type 2 diabetes mellitus with hyperglycemia: Secondary | ICD-10-CM | POA: Diagnosis not present

## 2015-08-27 DIAGNOSIS — E782 Mixed hyperlipidemia: Secondary | ICD-10-CM | POA: Diagnosis not present

## 2015-08-27 DIAGNOSIS — E039 Hypothyroidism, unspecified: Secondary | ICD-10-CM | POA: Diagnosis not present

## 2015-08-27 DIAGNOSIS — M810 Age-related osteoporosis without current pathological fracture: Secondary | ICD-10-CM | POA: Diagnosis not present

## 2015-12-14 DIAGNOSIS — R300 Dysuria: Secondary | ICD-10-CM | POA: Diagnosis not present

## 2015-12-14 DIAGNOSIS — E782 Mixed hyperlipidemia: Secondary | ICD-10-CM | POA: Diagnosis not present

## 2015-12-14 DIAGNOSIS — E039 Hypothyroidism, unspecified: Secondary | ICD-10-CM | POA: Diagnosis not present

## 2015-12-14 DIAGNOSIS — E1165 Type 2 diabetes mellitus with hyperglycemia: Secondary | ICD-10-CM | POA: Diagnosis not present

## 2015-12-14 DIAGNOSIS — R5383 Other fatigue: Secondary | ICD-10-CM | POA: Diagnosis not present

## 2015-12-17 DIAGNOSIS — E1165 Type 2 diabetes mellitus with hyperglycemia: Secondary | ICD-10-CM | POA: Diagnosis not present

## 2015-12-17 DIAGNOSIS — E782 Mixed hyperlipidemia: Secondary | ICD-10-CM | POA: Diagnosis not present

## 2015-12-17 DIAGNOSIS — J449 Chronic obstructive pulmonary disease, unspecified: Secondary | ICD-10-CM | POA: Diagnosis not present

## 2015-12-17 DIAGNOSIS — E039 Hypothyroidism, unspecified: Secondary | ICD-10-CM | POA: Diagnosis not present

## 2015-12-17 DIAGNOSIS — M810 Age-related osteoporosis without current pathological fracture: Secondary | ICD-10-CM | POA: Diagnosis not present

## 2015-12-17 DIAGNOSIS — I739 Peripheral vascular disease, unspecified: Secondary | ICD-10-CM | POA: Diagnosis not present

## 2015-12-17 DIAGNOSIS — Z681 Body mass index (BMI) 19 or less, adult: Secondary | ICD-10-CM | POA: Diagnosis not present

## 2016-03-04 ENCOUNTER — Encounter (INDEPENDENT_AMBULATORY_CARE_PROVIDER_SITE_OTHER): Payer: Self-pay

## 2016-03-04 ENCOUNTER — Encounter (INDEPENDENT_AMBULATORY_CARE_PROVIDER_SITE_OTHER): Payer: Self-pay | Admitting: Internal Medicine

## 2016-04-11 DIAGNOSIS — J449 Chronic obstructive pulmonary disease, unspecified: Secondary | ICD-10-CM | POA: Diagnosis not present

## 2016-04-11 DIAGNOSIS — E782 Mixed hyperlipidemia: Secondary | ICD-10-CM | POA: Diagnosis not present

## 2016-04-11 DIAGNOSIS — E1165 Type 2 diabetes mellitus with hyperglycemia: Secondary | ICD-10-CM | POA: Diagnosis not present

## 2016-04-11 DIAGNOSIS — R5383 Other fatigue: Secondary | ICD-10-CM | POA: Diagnosis not present

## 2016-04-11 DIAGNOSIS — M810 Age-related osteoporosis without current pathological fracture: Secondary | ICD-10-CM | POA: Diagnosis not present

## 2016-04-11 DIAGNOSIS — E039 Hypothyroidism, unspecified: Secondary | ICD-10-CM | POA: Diagnosis not present

## 2016-04-12 ENCOUNTER — Ambulatory Visit (INDEPENDENT_AMBULATORY_CARE_PROVIDER_SITE_OTHER): Payer: Medicare Other | Admitting: Internal Medicine

## 2016-04-14 DIAGNOSIS — E782 Mixed hyperlipidemia: Secondary | ICD-10-CM | POA: Diagnosis not present

## 2016-04-14 DIAGNOSIS — Z681 Body mass index (BMI) 19 or less, adult: Secondary | ICD-10-CM | POA: Diagnosis not present

## 2016-04-14 DIAGNOSIS — I739 Peripheral vascular disease, unspecified: Secondary | ICD-10-CM | POA: Diagnosis not present

## 2016-04-14 DIAGNOSIS — J449 Chronic obstructive pulmonary disease, unspecified: Secondary | ICD-10-CM | POA: Diagnosis not present

## 2016-04-14 DIAGNOSIS — E1165 Type 2 diabetes mellitus with hyperglycemia: Secondary | ICD-10-CM | POA: Diagnosis not present

## 2016-04-14 DIAGNOSIS — E039 Hypothyroidism, unspecified: Secondary | ICD-10-CM | POA: Diagnosis not present

## 2016-04-14 DIAGNOSIS — M810 Age-related osteoporosis without current pathological fracture: Secondary | ICD-10-CM | POA: Diagnosis not present

## 2016-04-18 ENCOUNTER — Ambulatory Visit (INDEPENDENT_AMBULATORY_CARE_PROVIDER_SITE_OTHER): Payer: Medicare Other | Admitting: Internal Medicine

## 2016-08-10 DIAGNOSIS — R300 Dysuria: Secondary | ICD-10-CM | POA: Diagnosis not present

## 2016-08-10 DIAGNOSIS — L110 Acquired keratosis follicularis: Secondary | ICD-10-CM | POA: Diagnosis not present

## 2016-08-10 DIAGNOSIS — J449 Chronic obstructive pulmonary disease, unspecified: Secondary | ICD-10-CM | POA: Diagnosis not present

## 2016-08-10 DIAGNOSIS — E1140 Type 2 diabetes mellitus with diabetic neuropathy, unspecified: Secondary | ICD-10-CM | POA: Diagnosis not present

## 2016-08-10 DIAGNOSIS — E1142 Type 2 diabetes mellitus with diabetic polyneuropathy: Secondary | ICD-10-CM | POA: Diagnosis not present

## 2016-08-10 DIAGNOSIS — Z9189 Other specified personal risk factors, not elsewhere classified: Secondary | ICD-10-CM | POA: Diagnosis not present

## 2016-08-10 DIAGNOSIS — E1165 Type 2 diabetes mellitus with hyperglycemia: Secondary | ICD-10-CM | POA: Diagnosis not present

## 2016-08-12 DIAGNOSIS — Z681 Body mass index (BMI) 19 or less, adult: Secondary | ICD-10-CM | POA: Diagnosis not present

## 2016-08-12 DIAGNOSIS — I739 Peripheral vascular disease, unspecified: Secondary | ICD-10-CM | POA: Diagnosis not present

## 2016-08-12 DIAGNOSIS — Z1389 Encounter for screening for other disorder: Secondary | ICD-10-CM | POA: Diagnosis not present

## 2016-08-12 DIAGNOSIS — M810 Age-related osteoporosis without current pathological fracture: Secondary | ICD-10-CM | POA: Diagnosis not present

## 2016-08-12 DIAGNOSIS — E782 Mixed hyperlipidemia: Secondary | ICD-10-CM | POA: Diagnosis not present

## 2016-08-12 DIAGNOSIS — E1165 Type 2 diabetes mellitus with hyperglycemia: Secondary | ICD-10-CM | POA: Diagnosis not present

## 2016-08-12 DIAGNOSIS — E039 Hypothyroidism, unspecified: Secondary | ICD-10-CM | POA: Diagnosis not present

## 2016-08-12 DIAGNOSIS — J449 Chronic obstructive pulmonary disease, unspecified: Secondary | ICD-10-CM | POA: Diagnosis not present

## 2016-08-18 DIAGNOSIS — Z681 Body mass index (BMI) 19 or less, adult: Secondary | ICD-10-CM | POA: Diagnosis not present

## 2016-08-18 DIAGNOSIS — J449 Chronic obstructive pulmonary disease, unspecified: Secondary | ICD-10-CM | POA: Diagnosis not present

## 2016-10-13 DIAGNOSIS — Z9181 History of falling: Secondary | ICD-10-CM | POA: Diagnosis not present

## 2016-10-13 DIAGNOSIS — E119 Type 2 diabetes mellitus without complications: Secondary | ICD-10-CM | POA: Diagnosis not present

## 2016-10-13 DIAGNOSIS — M6281 Muscle weakness (generalized): Secondary | ICD-10-CM | POA: Diagnosis not present

## 2016-10-13 DIAGNOSIS — Z7984 Long term (current) use of oral hypoglycemic drugs: Secondary | ICD-10-CM | POA: Diagnosis not present

## 2016-10-13 DIAGNOSIS — R296 Repeated falls: Secondary | ICD-10-CM | POA: Diagnosis not present

## 2016-10-13 DIAGNOSIS — J449 Chronic obstructive pulmonary disease, unspecified: Secondary | ICD-10-CM | POA: Diagnosis not present

## 2016-10-18 DIAGNOSIS — Z7984 Long term (current) use of oral hypoglycemic drugs: Secondary | ICD-10-CM | POA: Diagnosis not present

## 2016-10-18 DIAGNOSIS — R296 Repeated falls: Secondary | ICD-10-CM | POA: Diagnosis not present

## 2016-10-18 DIAGNOSIS — E119 Type 2 diabetes mellitus without complications: Secondary | ICD-10-CM | POA: Diagnosis not present

## 2016-10-18 DIAGNOSIS — Z9181 History of falling: Secondary | ICD-10-CM | POA: Diagnosis not present

## 2016-10-18 DIAGNOSIS — M6281 Muscle weakness (generalized): Secondary | ICD-10-CM | POA: Diagnosis not present

## 2016-10-18 DIAGNOSIS — J449 Chronic obstructive pulmonary disease, unspecified: Secondary | ICD-10-CM | POA: Diagnosis not present

## 2016-10-20 DIAGNOSIS — Z7984 Long term (current) use of oral hypoglycemic drugs: Secondary | ICD-10-CM | POA: Diagnosis not present

## 2016-10-20 DIAGNOSIS — Z9181 History of falling: Secondary | ICD-10-CM | POA: Diagnosis not present

## 2016-10-20 DIAGNOSIS — R296 Repeated falls: Secondary | ICD-10-CM | POA: Diagnosis not present

## 2016-10-20 DIAGNOSIS — J449 Chronic obstructive pulmonary disease, unspecified: Secondary | ICD-10-CM | POA: Diagnosis not present

## 2016-10-20 DIAGNOSIS — E119 Type 2 diabetes mellitus without complications: Secondary | ICD-10-CM | POA: Diagnosis not present

## 2016-10-20 DIAGNOSIS — M6281 Muscle weakness (generalized): Secondary | ICD-10-CM | POA: Diagnosis not present

## 2016-10-21 DIAGNOSIS — Z9181 History of falling: Secondary | ICD-10-CM | POA: Diagnosis not present

## 2016-10-21 DIAGNOSIS — M6281 Muscle weakness (generalized): Secondary | ICD-10-CM | POA: Diagnosis not present

## 2016-10-21 DIAGNOSIS — Z7984 Long term (current) use of oral hypoglycemic drugs: Secondary | ICD-10-CM | POA: Diagnosis not present

## 2016-10-21 DIAGNOSIS — R296 Repeated falls: Secondary | ICD-10-CM | POA: Diagnosis not present

## 2016-10-21 DIAGNOSIS — E119 Type 2 diabetes mellitus without complications: Secondary | ICD-10-CM | POA: Diagnosis not present

## 2016-10-21 DIAGNOSIS — J449 Chronic obstructive pulmonary disease, unspecified: Secondary | ICD-10-CM | POA: Diagnosis not present

## 2016-10-24 DIAGNOSIS — M6281 Muscle weakness (generalized): Secondary | ICD-10-CM | POA: Diagnosis not present

## 2016-10-24 DIAGNOSIS — Z9181 History of falling: Secondary | ICD-10-CM | POA: Diagnosis not present

## 2016-10-24 DIAGNOSIS — Z7984 Long term (current) use of oral hypoglycemic drugs: Secondary | ICD-10-CM | POA: Diagnosis not present

## 2016-10-24 DIAGNOSIS — R296 Repeated falls: Secondary | ICD-10-CM | POA: Diagnosis not present

## 2016-10-24 DIAGNOSIS — J449 Chronic obstructive pulmonary disease, unspecified: Secondary | ICD-10-CM | POA: Diagnosis not present

## 2016-10-24 DIAGNOSIS — E119 Type 2 diabetes mellitus without complications: Secondary | ICD-10-CM | POA: Diagnosis not present

## 2016-10-26 DIAGNOSIS — Z9181 History of falling: Secondary | ICD-10-CM | POA: Diagnosis not present

## 2016-10-26 DIAGNOSIS — J449 Chronic obstructive pulmonary disease, unspecified: Secondary | ICD-10-CM | POA: Diagnosis not present

## 2016-10-26 DIAGNOSIS — Z7984 Long term (current) use of oral hypoglycemic drugs: Secondary | ICD-10-CM | POA: Diagnosis not present

## 2016-10-26 DIAGNOSIS — M6281 Muscle weakness (generalized): Secondary | ICD-10-CM | POA: Diagnosis not present

## 2016-10-26 DIAGNOSIS — R296 Repeated falls: Secondary | ICD-10-CM | POA: Diagnosis not present

## 2016-10-26 DIAGNOSIS — E119 Type 2 diabetes mellitus without complications: Secondary | ICD-10-CM | POA: Diagnosis not present

## 2016-10-27 DIAGNOSIS — M6281 Muscle weakness (generalized): Secondary | ICD-10-CM | POA: Diagnosis not present

## 2016-10-27 DIAGNOSIS — J449 Chronic obstructive pulmonary disease, unspecified: Secondary | ICD-10-CM | POA: Diagnosis not present

## 2016-10-27 DIAGNOSIS — E119 Type 2 diabetes mellitus without complications: Secondary | ICD-10-CM | POA: Diagnosis not present

## 2016-10-27 DIAGNOSIS — R296 Repeated falls: Secondary | ICD-10-CM | POA: Diagnosis not present

## 2016-10-27 DIAGNOSIS — Z7984 Long term (current) use of oral hypoglycemic drugs: Secondary | ICD-10-CM | POA: Diagnosis not present

## 2016-10-27 DIAGNOSIS — Z9181 History of falling: Secondary | ICD-10-CM | POA: Diagnosis not present

## 2016-11-01 DIAGNOSIS — M6281 Muscle weakness (generalized): Secondary | ICD-10-CM | POA: Diagnosis not present

## 2016-11-01 DIAGNOSIS — Z7984 Long term (current) use of oral hypoglycemic drugs: Secondary | ICD-10-CM | POA: Diagnosis not present

## 2016-11-01 DIAGNOSIS — Z9181 History of falling: Secondary | ICD-10-CM | POA: Diagnosis not present

## 2016-11-01 DIAGNOSIS — R296 Repeated falls: Secondary | ICD-10-CM | POA: Diagnosis not present

## 2016-11-01 DIAGNOSIS — E119 Type 2 diabetes mellitus without complications: Secondary | ICD-10-CM | POA: Diagnosis not present

## 2016-11-01 DIAGNOSIS — J449 Chronic obstructive pulmonary disease, unspecified: Secondary | ICD-10-CM | POA: Diagnosis not present

## 2016-11-03 DIAGNOSIS — R296 Repeated falls: Secondary | ICD-10-CM | POA: Diagnosis not present

## 2016-11-03 DIAGNOSIS — E119 Type 2 diabetes mellitus without complications: Secondary | ICD-10-CM | POA: Diagnosis not present

## 2016-11-03 DIAGNOSIS — J449 Chronic obstructive pulmonary disease, unspecified: Secondary | ICD-10-CM | POA: Diagnosis not present

## 2016-11-03 DIAGNOSIS — M6281 Muscle weakness (generalized): Secondary | ICD-10-CM | POA: Diagnosis not present

## 2016-11-03 DIAGNOSIS — Z7984 Long term (current) use of oral hypoglycemic drugs: Secondary | ICD-10-CM | POA: Diagnosis not present

## 2016-11-03 DIAGNOSIS — Z9181 History of falling: Secondary | ICD-10-CM | POA: Diagnosis not present

## 2016-11-07 DIAGNOSIS — E119 Type 2 diabetes mellitus without complications: Secondary | ICD-10-CM | POA: Diagnosis not present

## 2016-11-07 DIAGNOSIS — M6281 Muscle weakness (generalized): Secondary | ICD-10-CM | POA: Diagnosis not present

## 2016-11-07 DIAGNOSIS — Z7984 Long term (current) use of oral hypoglycemic drugs: Secondary | ICD-10-CM | POA: Diagnosis not present

## 2016-11-07 DIAGNOSIS — Z9181 History of falling: Secondary | ICD-10-CM | POA: Diagnosis not present

## 2016-11-07 DIAGNOSIS — J449 Chronic obstructive pulmonary disease, unspecified: Secondary | ICD-10-CM | POA: Diagnosis not present

## 2016-11-07 DIAGNOSIS — R296 Repeated falls: Secondary | ICD-10-CM | POA: Diagnosis not present

## 2016-11-09 DIAGNOSIS — J449 Chronic obstructive pulmonary disease, unspecified: Secondary | ICD-10-CM | POA: Diagnosis not present

## 2016-11-09 DIAGNOSIS — E119 Type 2 diabetes mellitus without complications: Secondary | ICD-10-CM | POA: Diagnosis not present

## 2016-11-09 DIAGNOSIS — R296 Repeated falls: Secondary | ICD-10-CM | POA: Diagnosis not present

## 2016-11-09 DIAGNOSIS — Z9181 History of falling: Secondary | ICD-10-CM | POA: Diagnosis not present

## 2016-11-09 DIAGNOSIS — Z7984 Long term (current) use of oral hypoglycemic drugs: Secondary | ICD-10-CM | POA: Diagnosis not present

## 2016-11-09 DIAGNOSIS — M6281 Muscle weakness (generalized): Secondary | ICD-10-CM | POA: Diagnosis not present

## 2016-11-16 DIAGNOSIS — J449 Chronic obstructive pulmonary disease, unspecified: Secondary | ICD-10-CM | POA: Diagnosis not present

## 2016-11-16 DIAGNOSIS — Z9181 History of falling: Secondary | ICD-10-CM | POA: Diagnosis not present

## 2016-11-16 DIAGNOSIS — E119 Type 2 diabetes mellitus without complications: Secondary | ICD-10-CM | POA: Diagnosis not present

## 2016-11-16 DIAGNOSIS — R296 Repeated falls: Secondary | ICD-10-CM | POA: Diagnosis not present

## 2016-11-16 DIAGNOSIS — Z7984 Long term (current) use of oral hypoglycemic drugs: Secondary | ICD-10-CM | POA: Diagnosis not present

## 2016-11-16 DIAGNOSIS — M6281 Muscle weakness (generalized): Secondary | ICD-10-CM | POA: Diagnosis not present

## 2016-11-18 DIAGNOSIS — R296 Repeated falls: Secondary | ICD-10-CM | POA: Diagnosis not present

## 2016-11-18 DIAGNOSIS — J449 Chronic obstructive pulmonary disease, unspecified: Secondary | ICD-10-CM | POA: Diagnosis not present

## 2016-11-18 DIAGNOSIS — M6281 Muscle weakness (generalized): Secondary | ICD-10-CM | POA: Diagnosis not present

## 2016-11-18 DIAGNOSIS — Z7984 Long term (current) use of oral hypoglycemic drugs: Secondary | ICD-10-CM | POA: Diagnosis not present

## 2016-11-18 DIAGNOSIS — E119 Type 2 diabetes mellitus without complications: Secondary | ICD-10-CM | POA: Diagnosis not present

## 2016-11-18 DIAGNOSIS — Z9181 History of falling: Secondary | ICD-10-CM | POA: Diagnosis not present

## 2016-11-22 DIAGNOSIS — E119 Type 2 diabetes mellitus without complications: Secondary | ICD-10-CM | POA: Diagnosis not present

## 2016-11-22 DIAGNOSIS — J449 Chronic obstructive pulmonary disease, unspecified: Secondary | ICD-10-CM | POA: Diagnosis not present

## 2016-11-22 DIAGNOSIS — Z9181 History of falling: Secondary | ICD-10-CM | POA: Diagnosis not present

## 2016-11-22 DIAGNOSIS — Z7984 Long term (current) use of oral hypoglycemic drugs: Secondary | ICD-10-CM | POA: Diagnosis not present

## 2016-11-22 DIAGNOSIS — R296 Repeated falls: Secondary | ICD-10-CM | POA: Diagnosis not present

## 2016-11-22 DIAGNOSIS — M6281 Muscle weakness (generalized): Secondary | ICD-10-CM | POA: Diagnosis not present

## 2016-11-24 DIAGNOSIS — J449 Chronic obstructive pulmonary disease, unspecified: Secondary | ICD-10-CM | POA: Diagnosis not present

## 2016-11-24 DIAGNOSIS — Z7984 Long term (current) use of oral hypoglycemic drugs: Secondary | ICD-10-CM | POA: Diagnosis not present

## 2016-11-24 DIAGNOSIS — R296 Repeated falls: Secondary | ICD-10-CM | POA: Diagnosis not present

## 2016-11-24 DIAGNOSIS — Z9181 History of falling: Secondary | ICD-10-CM | POA: Diagnosis not present

## 2016-11-24 DIAGNOSIS — E119 Type 2 diabetes mellitus without complications: Secondary | ICD-10-CM | POA: Diagnosis not present

## 2016-11-24 DIAGNOSIS — M6281 Muscle weakness (generalized): Secondary | ICD-10-CM | POA: Diagnosis not present

## 2016-12-07 DIAGNOSIS — M6281 Muscle weakness (generalized): Secondary | ICD-10-CM | POA: Diagnosis not present

## 2016-12-07 DIAGNOSIS — R262 Difficulty in walking, not elsewhere classified: Secondary | ICD-10-CM | POA: Diagnosis not present

## 2016-12-08 DIAGNOSIS — E782 Mixed hyperlipidemia: Secondary | ICD-10-CM | POA: Diagnosis not present

## 2016-12-08 DIAGNOSIS — Z9189 Other specified personal risk factors, not elsewhere classified: Secondary | ICD-10-CM | POA: Diagnosis not present

## 2016-12-08 DIAGNOSIS — R300 Dysuria: Secondary | ICD-10-CM | POA: Diagnosis not present

## 2016-12-08 DIAGNOSIS — E1165 Type 2 diabetes mellitus with hyperglycemia: Secondary | ICD-10-CM | POA: Diagnosis not present

## 2016-12-08 DIAGNOSIS — E039 Hypothyroidism, unspecified: Secondary | ICD-10-CM | POA: Diagnosis not present

## 2016-12-08 DIAGNOSIS — J449 Chronic obstructive pulmonary disease, unspecified: Secondary | ICD-10-CM | POA: Diagnosis not present

## 2016-12-08 DIAGNOSIS — R5383 Other fatigue: Secondary | ICD-10-CM | POA: Diagnosis not present

## 2016-12-09 DIAGNOSIS — M6281 Muscle weakness (generalized): Secondary | ICD-10-CM | POA: Diagnosis not present

## 2016-12-09 DIAGNOSIS — R262 Difficulty in walking, not elsewhere classified: Secondary | ICD-10-CM | POA: Diagnosis not present

## 2016-12-12 DIAGNOSIS — R262 Difficulty in walking, not elsewhere classified: Secondary | ICD-10-CM | POA: Diagnosis not present

## 2016-12-12 DIAGNOSIS — M6281 Muscle weakness (generalized): Secondary | ICD-10-CM | POA: Diagnosis not present

## 2016-12-13 DIAGNOSIS — E782 Mixed hyperlipidemia: Secondary | ICD-10-CM | POA: Diagnosis not present

## 2016-12-13 DIAGNOSIS — I739 Peripheral vascular disease, unspecified: Secondary | ICD-10-CM | POA: Diagnosis not present

## 2016-12-13 DIAGNOSIS — J449 Chronic obstructive pulmonary disease, unspecified: Secondary | ICD-10-CM | POA: Diagnosis not present

## 2016-12-13 DIAGNOSIS — E039 Hypothyroidism, unspecified: Secondary | ICD-10-CM | POA: Diagnosis not present

## 2016-12-13 DIAGNOSIS — Z0001 Encounter for general adult medical examination with abnormal findings: Secondary | ICD-10-CM | POA: Diagnosis not present

## 2016-12-13 DIAGNOSIS — Z681 Body mass index (BMI) 19 or less, adult: Secondary | ICD-10-CM | POA: Diagnosis not present

## 2016-12-13 DIAGNOSIS — E1165 Type 2 diabetes mellitus with hyperglycemia: Secondary | ICD-10-CM | POA: Diagnosis not present

## 2016-12-13 DIAGNOSIS — M810 Age-related osteoporosis without current pathological fracture: Secondary | ICD-10-CM | POA: Diagnosis not present

## 2016-12-16 DIAGNOSIS — R262 Difficulty in walking, not elsewhere classified: Secondary | ICD-10-CM | POA: Diagnosis not present

## 2016-12-16 DIAGNOSIS — M6281 Muscle weakness (generalized): Secondary | ICD-10-CM | POA: Diagnosis not present

## 2016-12-19 DIAGNOSIS — R262 Difficulty in walking, not elsewhere classified: Secondary | ICD-10-CM | POA: Diagnosis not present

## 2016-12-19 DIAGNOSIS — M6281 Muscle weakness (generalized): Secondary | ICD-10-CM | POA: Diagnosis not present

## 2016-12-23 DIAGNOSIS — M6281 Muscle weakness (generalized): Secondary | ICD-10-CM | POA: Diagnosis not present

## 2016-12-23 DIAGNOSIS — R262 Difficulty in walking, not elsewhere classified: Secondary | ICD-10-CM | POA: Diagnosis not present

## 2016-12-26 DIAGNOSIS — R262 Difficulty in walking, not elsewhere classified: Secondary | ICD-10-CM | POA: Diagnosis not present

## 2016-12-26 DIAGNOSIS — M6281 Muscle weakness (generalized): Secondary | ICD-10-CM | POA: Diagnosis not present

## 2016-12-30 DIAGNOSIS — M6281 Muscle weakness (generalized): Secondary | ICD-10-CM | POA: Diagnosis not present

## 2016-12-30 DIAGNOSIS — R262 Difficulty in walking, not elsewhere classified: Secondary | ICD-10-CM | POA: Diagnosis not present

## 2017-01-06 DIAGNOSIS — M6281 Muscle weakness (generalized): Secondary | ICD-10-CM | POA: Diagnosis not present

## 2017-01-06 DIAGNOSIS — R262 Difficulty in walking, not elsewhere classified: Secondary | ICD-10-CM | POA: Diagnosis not present

## 2017-01-09 DIAGNOSIS — R262 Difficulty in walking, not elsewhere classified: Secondary | ICD-10-CM | POA: Diagnosis not present

## 2017-01-09 DIAGNOSIS — M6281 Muscle weakness (generalized): Secondary | ICD-10-CM | POA: Diagnosis not present

## 2017-01-11 DIAGNOSIS — R262 Difficulty in walking, not elsewhere classified: Secondary | ICD-10-CM | POA: Diagnosis not present

## 2017-01-11 DIAGNOSIS — M6281 Muscle weakness (generalized): Secondary | ICD-10-CM | POA: Diagnosis not present

## 2017-01-20 DIAGNOSIS — M6281 Muscle weakness (generalized): Secondary | ICD-10-CM | POA: Diagnosis not present

## 2017-01-20 DIAGNOSIS — R262 Difficulty in walking, not elsewhere classified: Secondary | ICD-10-CM | POA: Diagnosis not present

## 2017-01-21 DIAGNOSIS — Z7401 Bed confinement status: Secondary | ICD-10-CM | POA: Diagnosis not present

## 2017-01-21 DIAGNOSIS — Z7984 Long term (current) use of oral hypoglycemic drugs: Secondary | ICD-10-CM | POA: Diagnosis not present

## 2017-01-21 DIAGNOSIS — M25461 Effusion, right knee: Secondary | ICD-10-CM | POA: Diagnosis not present

## 2017-01-21 DIAGNOSIS — W1839XA Other fall on same level, initial encounter: Secondary | ICD-10-CM | POA: Diagnosis not present

## 2017-01-21 DIAGNOSIS — R279 Unspecified lack of coordination: Secondary | ICD-10-CM | POA: Diagnosis not present

## 2017-01-21 DIAGNOSIS — M79605 Pain in left leg: Secondary | ICD-10-CM | POA: Diagnosis not present

## 2017-01-21 DIAGNOSIS — S7291XA Unspecified fracture of right femur, initial encounter for closed fracture: Secondary | ICD-10-CM | POA: Diagnosis not present

## 2017-01-21 DIAGNOSIS — S72451A Displaced supracondylar fracture without intracondylar extension of lower end of right femur, initial encounter for closed fracture: Secondary | ICD-10-CM | POA: Diagnosis not present

## 2017-01-21 DIAGNOSIS — Z79899 Other long term (current) drug therapy: Secondary | ICD-10-CM | POA: Diagnosis not present

## 2017-01-21 DIAGNOSIS — E119 Type 2 diabetes mellitus without complications: Secondary | ICD-10-CM | POA: Diagnosis not present

## 2017-01-21 DIAGNOSIS — E039 Hypothyroidism, unspecified: Secondary | ICD-10-CM | POA: Diagnosis not present

## 2017-01-26 ENCOUNTER — Ambulatory Visit (INDEPENDENT_AMBULATORY_CARE_PROVIDER_SITE_OTHER): Payer: Medicare Other | Admitting: Orthopaedic Surgery

## 2017-01-26 ENCOUNTER — Encounter (INDEPENDENT_AMBULATORY_CARE_PROVIDER_SITE_OTHER): Payer: Self-pay | Admitting: Orthopaedic Surgery

## 2017-01-26 VITALS — Ht 61.0 in | Wt 74.4 lb

## 2017-01-26 DIAGNOSIS — S72402A Unspecified fracture of lower end of left femur, initial encounter for closed fracture: Secondary | ICD-10-CM

## 2017-01-26 DIAGNOSIS — Z743 Need for continuous supervision: Secondary | ICD-10-CM | POA: Diagnosis not present

## 2017-01-26 DIAGNOSIS — R279 Unspecified lack of coordination: Secondary | ICD-10-CM | POA: Diagnosis not present

## 2017-01-26 NOTE — Progress Notes (Signed)
Office Visit Note   Patient: Misty [name]           Date of Birth: 1929-08-14           MRN: [id number] Visit Date: 01/26/2017              Requested by: Richardean Chimera, MD [address] [place], [place] [address] PCP: Richardean Chimera, MD   Assessment & Plan: Visit Diagnoses:  1. Closed fracture of distal end of left femur, unspecified fracture morphology, initial encounter (HCC)     Plan: X-rays reviewed shows considerable osteopenia fractures nondisplaced non-angulated. She can continue with the knee immobilizer physical therapy for touchdown weightbearing working on transfers. I plan to check her back again in 3 weeks for repeat x-rays of the distal femur in the week to make a decision about increasing her weightbearing. Fractures the distal third shaft of the femur and we discussed the importance of keeping her knee immobilizer on and avoid falling so this does not displace.  Follow-Up Instructions: No Follow-up on file.   Orders:  No orders of the defined types were placed in this encounter.  No orders of the defined types were placed in this encounter.     Procedures: No procedures performed   Clinical Data: No additional findings.   Subjective: Chief Complaint  Patient presents with  . Right Leg - Fracture    HPI 83 year old female was getting in the car slid and actually fell landed on the ground. She suffered a right distal femur fracture without angulation or displacement on 01/21/2017 was placed in a knee immobilizer. I treated her for opposite hip fracture several years ago which healed nicely with a trochanteric nail. She's used tramadol with relief. She is here with her son was transported by EMS.  Review of Systems is systems updated positive for osteoporosis previous left intertrochanteric fracture positive for GERD hyponatremia hypothyroidism.   Objective: Vital Signs: Ht  (1.549 m)   Wt 74 lb 6.4 oz (33.7 kg)   BMI 14.06 kg/m   Physical  Exam  Ortho Exam  Specialty Comments:  No specialty comments available.  Imaging: No results found.   PMFS History: Patient Active Problem List   Diagnosis Date Noted  . Diabetes mellitus, type 2 (HCC) 07/05/2014  . GERD (gastroesophageal reflux disease) 07/05/2014  . Fracture of femur, distal, left, closed (HCC) 07/05/2014  . Osteoporosis 07/05/2014  . Hyponatremia 07/05/2014  . Anorexia 11/28/2012  . Hypothyroidism 11/28/2012  . Mirizzi's syndrome (h/o post cholecystectomy) 06/27/2011  . Sarcoidosis 06/27/2011   Past Medical History:  Diagnosis Date  . Anorexia   . Biliary obstruction   . Congestive heart failure [facility]) December, 2012   [facility]  . Diabetes mellitus   . GERD (gastroesophageal reflux disease)   . Hypothyroidism   . Jaundice   . Sarcoidosis   . Weight loss     Family History  Problem Relation Age of Onset  . Anesthesia problems Neg Hx   . Hypotension Neg Hx   . Malignant hyperthermia Neg Hx   . Pseudochol deficiency Neg Hx   . Scoliosis Son   . Hypertension Son   . Sinusitis Son   . Healthy Son     Past Surgical History:  Procedure Laterality Date  . APPENDECTOMY    . BILIARY STENT PLACEMENT  02/16/2011   Procedure: BILIARY STENT PLACEMENT;  Surgeon: Malissa Hippo, MD;  Location: [facility];  Service: Endoscopy;  Laterality: N/A;  . BILIARY STENT PLACEMENT  04/20/2011   Procedure: BILIARY STENT PLACEMENT;  Surgeon: Malissa Hippo, MD;  Location: [facility];  Service: Endoscopy;  Laterality: N/A;  . CHOLECYSTECTOMY  04/27/2011   Procedure: LAPAROSCOPIC CHOLECYSTECTOMY WITH INTRAOPERATIVE CHOLANGIOGRAM;  Surgeon: Fabio Bering, MD;  Location: [facility] ORS;  Service: General;  Laterality: N/A;  . ERCP  02/16/2011   Procedure: ENDOSCOPIC RETROGRADE CHOLANGIOPANCREATOGRAPHY (ERCP);  Surgeon: Malissa Hippo, MD;  Location: [facility];  Service: Endoscopy;  Laterality: N/A;  7:30/with Stent Removal  . ERCP  10/08/2010  . ERCP   04/20/2011   Procedure: ENDOSCOPIC RETROGRADE CHOLANGIOPANCREATOGRAPHY (ERCP);  Surgeon: Malissa Hippo, MD;  Location: [facility] OR[facility];  Service: Endoscopy;  Laterality: N/A;  removal of stent, removal of multiple stones with basket  . ERCP  09/14/2011   Procedure: ENDOSCOPIC RETROGRADE CHOLANGIOPANCREATOGRAPHY (ERCP);  Surgeon: Malissa Hippo, MD;  Location: [facility] OR[facility];  Service: Endoscopy;  Laterality: N/A;  ERCP with Stent Removal  . HIP SURGERY Left November 20.2014   Left Hip Fracture- Done @ [facility]  . ORIF FEMUR FRACTURE Left 07/05/2014   Procedure: Left super condylar femur fracture;  Surgeon: Eldred Manges, MD;  Location: [facility] OR;  Service: Orthopedics;  Laterality: Left;  . SPHINCTEROTOMY  02/16/2011   Procedure: SPHINCTEROTOMY;  Surgeon: Malissa Hippo, MD;  Location: [facility];  Service: Endoscopy;  Laterality: N/A;  . SPHINCTEROTOMY  09/14/2011   Procedure: SPHINCTEROTOMY;  Surgeon: Malissa Hippo, MD;  Location: [facility];  Service: Endoscopy;  Laterality: N/A;  . SPYGLASS CHOLANGIOSCOPY  04/20/2011   Procedure: SPYGLASS CHOLANGIOSCOPY;  Surgeon: Malissa Hippo, MD;  Location: [facility];  Service: Endoscopy;  Laterality: N/A;  . SPYGLASS CHOLANGIOSCOPY  09/14/2011   Procedure: SPYGLASS CHOLANGIOSCOPY;  Surgeon: Malissa Hippo, MD;  Location: [facility];  Service: Endoscopy;  Laterality: N/A;  . VAGINA SURGERY  as a teen   Social History   Occupational History  . Not on file.   Social History Main Topics  . Smoking status: Never Smoker  . Smokeless tobacco: Never Used  . Alcohol use No  . Drug use: No  . Sexual activity: Not Currently

## 2017-01-30 ENCOUNTER — Telehealth (INDEPENDENT_AMBULATORY_CARE_PROVIDER_SITE_OTHER): Payer: Self-pay | Admitting: Orthopaedic Surgery

## 2017-01-30 NOTE — Telephone Encounter (Signed)
Patient's son [name]) called advised [facility] have not come out to see his mother. [name] said her bandage need to be changed. He advised [facility] said they faxed the order back asking for clarification. He advised his mother was suppose to have an aid sent out to help his mother as well. The number to contact [name] is [phone]

## 2017-01-30 NOTE — Telephone Encounter (Signed)
Information was refaxed to [facility] on Friday. I tried to call patient's son, but [employer] answering message picks up and says try again later.

## 2017-01-31 ENCOUNTER — Telehealth (INDEPENDENT_AMBULATORY_CARE_PROVIDER_SITE_OTHER): Payer: Self-pay

## 2017-01-31 DIAGNOSIS — I509 Heart failure, unspecified: Secondary | ICD-10-CM | POA: Diagnosis not present

## 2017-01-31 DIAGNOSIS — E119 Type 2 diabetes mellitus without complications: Secondary | ICD-10-CM | POA: Diagnosis not present

## 2017-01-31 DIAGNOSIS — Z8781 Personal history of (healed) traumatic fracture: Secondary | ICD-10-CM | POA: Diagnosis not present

## 2017-01-31 DIAGNOSIS — M810 Age-related osteoporosis without current pathological fracture: Secondary | ICD-10-CM | POA: Diagnosis not present

## 2017-01-31 DIAGNOSIS — L89152 Pressure ulcer of sacral region, stage 2: Secondary | ICD-10-CM | POA: Diagnosis not present

## 2017-01-31 DIAGNOSIS — M85851 Other specified disorders of bone density and structure, right thigh: Secondary | ICD-10-CM | POA: Diagnosis not present

## 2017-01-31 DIAGNOSIS — Z79891 Long term (current) use of opiate analgesic: Secondary | ICD-10-CM | POA: Diagnosis not present

## 2017-01-31 DIAGNOSIS — K219 Gastro-esophageal reflux disease without esophagitis: Secondary | ICD-10-CM | POA: Diagnosis not present

## 2017-01-31 DIAGNOSIS — D869 Sarcoidosis, unspecified: Secondary | ICD-10-CM | POA: Diagnosis not present

## 2017-01-31 DIAGNOSIS — Z7984 Long term (current) use of oral hypoglycemic drugs: Secondary | ICD-10-CM | POA: Diagnosis not present

## 2017-01-31 DIAGNOSIS — S72401D Unspecified fracture of lower end of right femur, subsequent encounter for closed fracture with routine healing: Secondary | ICD-10-CM | POA: Diagnosis not present

## 2017-01-31 DIAGNOSIS — Z8701 Personal history of pneumonia (recurrent): Secondary | ICD-10-CM | POA: Diagnosis not present

## 2017-01-31 DIAGNOSIS — E039 Hypothyroidism, unspecified: Secondary | ICD-10-CM | POA: Diagnosis not present

## 2017-01-31 NOTE — Telephone Encounter (Signed)
Noted  Duplicate message  

## 2017-01-31 NOTE — Telephone Encounter (Signed)
Patient's son states that they have not heard anything from [facility] and he is having difficulty getting in touch with them. I advised that I would call and have them call him.

## 2017-01-31 NOTE — Telephone Encounter (Signed)
Ok for [facility] eval

## 2017-01-31 NOTE — Telephone Encounter (Signed)
I called [name] and advised, please change ace wrap if soiled.  If splint is soiled, please call office and we will schedule appt to change splint. [name] advised she will put this in her plan and does not need new order.

## 2017-01-31 NOTE — Telephone Encounter (Signed)
I called and spoke with patient's son. He states that he needs a nurse to take a look at his mother's splint as well. He thinks that she has messed up the outer ace wrap while trying to go to the bathroom. He would like to have this addressed and changed if possible. Please advise on order for [facility] to do this?  Patient traveled to [place] office by EMS for appt last week. Does she need to come in for [place] to check splint?

## 2017-01-31 NOTE — Telephone Encounter (Signed)
I called and spoke with Tabitha at [facility]. She states that the information is in nurse review and that the family should hear from [facility] today. She gave me the number [phone] for the patient's son to call if he does not hear from them.

## 2017-01-31 NOTE — Telephone Encounter (Signed)
[name] with [facility] would like clarification on dressing change/splint for patient.  Stated that she needs an order.  She is currently there with patient.  Cb# is [phone].  Please advise.  Thank You.

## 2017-02-01 DIAGNOSIS — L89152 Pressure ulcer of sacral region, stage 2: Secondary | ICD-10-CM | POA: Diagnosis not present

## 2017-02-01 DIAGNOSIS — E119 Type 2 diabetes mellitus without complications: Secondary | ICD-10-CM | POA: Diagnosis not present

## 2017-02-01 DIAGNOSIS — D869 Sarcoidosis, unspecified: Secondary | ICD-10-CM | POA: Diagnosis not present

## 2017-02-01 DIAGNOSIS — S72401D Unspecified fracture of lower end of right femur, subsequent encounter for closed fracture with routine healing: Secondary | ICD-10-CM | POA: Diagnosis not present

## 2017-02-01 DIAGNOSIS — M810 Age-related osteoporosis without current pathological fracture: Secondary | ICD-10-CM | POA: Diagnosis not present

## 2017-02-01 DIAGNOSIS — M85851 Other specified disorders of bone density and structure, right thigh: Secondary | ICD-10-CM | POA: Diagnosis not present

## 2017-02-03 DIAGNOSIS — E119 Type 2 diabetes mellitus without complications: Secondary | ICD-10-CM | POA: Diagnosis not present

## 2017-02-03 DIAGNOSIS — S72401D Unspecified fracture of lower end of right femur, subsequent encounter for closed fracture with routine healing: Secondary | ICD-10-CM | POA: Diagnosis not present

## 2017-02-03 DIAGNOSIS — M85851 Other specified disorders of bone density and structure, right thigh: Secondary | ICD-10-CM | POA: Diagnosis not present

## 2017-02-03 DIAGNOSIS — M810 Age-related osteoporosis without current pathological fracture: Secondary | ICD-10-CM | POA: Diagnosis not present

## 2017-02-03 DIAGNOSIS — L89152 Pressure ulcer of sacral region, stage 2: Secondary | ICD-10-CM | POA: Diagnosis not present

## 2017-02-03 DIAGNOSIS — D869 Sarcoidosis, unspecified: Secondary | ICD-10-CM | POA: Diagnosis not present

## 2017-02-06 DIAGNOSIS — S72401D Unspecified fracture of lower end of right femur, subsequent encounter for closed fracture with routine healing: Secondary | ICD-10-CM | POA: Diagnosis not present

## 2017-02-06 DIAGNOSIS — M85851 Other specified disorders of bone density and structure, right thigh: Secondary | ICD-10-CM | POA: Diagnosis not present

## 2017-02-06 DIAGNOSIS — D869 Sarcoidosis, unspecified: Secondary | ICD-10-CM | POA: Diagnosis not present

## 2017-02-06 DIAGNOSIS — E119 Type 2 diabetes mellitus without complications: Secondary | ICD-10-CM | POA: Diagnosis not present

## 2017-02-06 DIAGNOSIS — L89152 Pressure ulcer of sacral region, stage 2: Secondary | ICD-10-CM | POA: Diagnosis not present

## 2017-02-06 DIAGNOSIS — R300 Dysuria: Secondary | ICD-10-CM | POA: Diagnosis not present

## 2017-02-06 DIAGNOSIS — M810 Age-related osteoporosis without current pathological fracture: Secondary | ICD-10-CM | POA: Diagnosis not present

## 2017-02-07 DIAGNOSIS — D869 Sarcoidosis, unspecified: Secondary | ICD-10-CM | POA: Diagnosis not present

## 2017-02-07 DIAGNOSIS — L89152 Pressure ulcer of sacral region, stage 2: Secondary | ICD-10-CM | POA: Diagnosis not present

## 2017-02-07 DIAGNOSIS — E119 Type 2 diabetes mellitus without complications: Secondary | ICD-10-CM | POA: Diagnosis not present

## 2017-02-07 DIAGNOSIS — M810 Age-related osteoporosis without current pathological fracture: Secondary | ICD-10-CM | POA: Diagnosis not present

## 2017-02-07 DIAGNOSIS — S72401D Unspecified fracture of lower end of right femur, subsequent encounter for closed fracture with routine healing: Secondary | ICD-10-CM | POA: Diagnosis not present

## 2017-02-07 DIAGNOSIS — M85851 Other specified disorders of bone density and structure, right thigh: Secondary | ICD-10-CM | POA: Diagnosis not present

## 2017-02-08 DIAGNOSIS — M85851 Other specified disorders of bone density and structure, right thigh: Secondary | ICD-10-CM | POA: Diagnosis not present

## 2017-02-08 DIAGNOSIS — L89152 Pressure ulcer of sacral region, stage 2: Secondary | ICD-10-CM | POA: Diagnosis not present

## 2017-02-08 DIAGNOSIS — D869 Sarcoidosis, unspecified: Secondary | ICD-10-CM | POA: Diagnosis not present

## 2017-02-08 DIAGNOSIS — E119 Type 2 diabetes mellitus without complications: Secondary | ICD-10-CM | POA: Diagnosis not present

## 2017-02-08 DIAGNOSIS — S72401D Unspecified fracture of lower end of right femur, subsequent encounter for closed fracture with routine healing: Secondary | ICD-10-CM | POA: Diagnosis not present

## 2017-02-08 DIAGNOSIS — M810 Age-related osteoporosis without current pathological fracture: Secondary | ICD-10-CM | POA: Diagnosis not present

## 2017-02-09 ENCOUNTER — Ambulatory Visit (INDEPENDENT_AMBULATORY_CARE_PROVIDER_SITE_OTHER): Payer: Self-pay

## 2017-02-09 ENCOUNTER — Ambulatory Visit (INDEPENDENT_AMBULATORY_CARE_PROVIDER_SITE_OTHER): Payer: Medicare Other

## 2017-02-09 ENCOUNTER — Encounter (INDEPENDENT_AMBULATORY_CARE_PROVIDER_SITE_OTHER): Payer: Self-pay | Admitting: Orthopaedic Surgery

## 2017-02-09 ENCOUNTER — Ambulatory Visit (INDEPENDENT_AMBULATORY_CARE_PROVIDER_SITE_OTHER): Payer: Medicare Other | Admitting: Orthopaedic Surgery

## 2017-02-09 DIAGNOSIS — S72402D Unspecified fracture of lower end of left femur, subsequent encounter for closed fracture with routine healing: Secondary | ICD-10-CM

## 2017-02-09 DIAGNOSIS — S72401D Unspecified fracture of lower end of right femur, subsequent encounter for closed fracture with routine healing: Secondary | ICD-10-CM

## 2017-02-09 DIAGNOSIS — M810 Age-related osteoporosis without current pathological fracture: Secondary | ICD-10-CM | POA: Diagnosis not present

## 2017-02-09 DIAGNOSIS — E119 Type 2 diabetes mellitus without complications: Secondary | ICD-10-CM | POA: Diagnosis not present

## 2017-02-09 DIAGNOSIS — M85851 Other specified disorders of bone density and structure, right thigh: Secondary | ICD-10-CM | POA: Diagnosis not present

## 2017-02-09 DIAGNOSIS — R279 Unspecified lack of coordination: Secondary | ICD-10-CM | POA: Diagnosis not present

## 2017-02-09 DIAGNOSIS — L89152 Pressure ulcer of sacral region, stage 2: Secondary | ICD-10-CM | POA: Diagnosis not present

## 2017-02-09 DIAGNOSIS — Z743 Need for continuous supervision: Secondary | ICD-10-CM | POA: Diagnosis not present

## 2017-02-09 DIAGNOSIS — D869 Sarcoidosis, unspecified: Secondary | ICD-10-CM | POA: Diagnosis not present

## 2017-02-09 MED ORDER — HYDROCODONE-ACETAMINOPHEN 5-325 MG PO TABS: 1.0000 | ORAL_TABLET | Freq: Four times a day (QID) | ORAL | 0 refills | Status: AC | PRN

## 2017-02-09 NOTE — Progress Notes (Signed)
Post-Op Visit Note   Patient: Misty [name]           Date of Birth: 07-24-1929           MRN: [id number] Visit Date: 02/09/2017 PCP: Richardean Chimera, MD   Assessment & Plan: Follow-up 4 weeks for repeat x-rays. Splint is removed skin is checked new small was applied which is a posterior splint from the groin to the ankle.  Chief Complaint:  Chief Complaint  Patient presents with  . Left Leg - Fracture   Visit Diagnoses:  1. Closed fracture of distal end of left femur with routine healing, unspecified fracture morphology, subsequent encounter     Plan: Return 4 weeks for x-rays in the splint.  Follow-Up Instructions: No Follow-up on file.   Orders:  Orders Placed This Encounter  Procedures  . XR FEMUR MIN 2 VIEWS LEFT   No orders of the defined types were placed in this encounter.   Imaging: Xr Femur Min 2 Views Left  Result Date: 02/09/2017 Two-view x-rays right femur obtained that shows distal supracondylar fracture without intercondylar extension with some interval callus periosteal healing. Impression: Stable supracondylar femur fracture without intercondylar extension unchanged in alignment from last month's x-rays on PACS system. There is no significant displacement with minimal angulation and 2 mm shortening.   PMFS History: Patient Active Problem List   Diagnosis Date Noted  . Diabetes mellitus, type 2 (HCC) 07/05/2014  . GERD (gastroesophageal reflux disease) 07/05/2014  . Fracture of femur, distal, left, closed (HCC) 07/05/2014  . Osteoporosis 07/05/2014  . Hyponatremia 07/05/2014  . Anorexia 11/28/2012  . Hypothyroidism 11/28/2012  . Mirizzi's syndrome (h/o post cholecystectomy) 06/27/2011  . Sarcoidosis 06/27/2011   Past Medical History:  Diagnosis Date  . Anorexia   . Biliary obstruction   . Congestive heart failure [facility]) December, 2012   [facility]  . Diabetes mellitus   . GERD (gastroesophageal reflux disease)   .  Hypothyroidism   . Jaundice   . Sarcoidosis   . Weight loss     Family History  Problem Relation Age of Onset  . Anesthesia problems Neg Hx   . Hypotension Neg Hx   . Malignant hyperthermia Neg Hx   . Pseudochol deficiency Neg Hx   . Scoliosis Son   . Hypertension Son   . Sinusitis Son   . Healthy Son     Past Surgical History:  Procedure Laterality Date  . APPENDECTOMY    . BILIARY STENT PLACEMENT  02/16/2011   Procedure: BILIARY STENT PLACEMENT;  Surgeon: Malissa Hippo, MD;  Location: [facility];  Service: Endoscopy;  Laterality: N/A;  . BILIARY STENT PLACEMENT  04/20/2011   Procedure: BILIARY STENT PLACEMENT;  Surgeon: Malissa Hippo, MD;  Location: [facility];  Service: Endoscopy;  Laterality: N/A;  . CHOLECYSTECTOMY  04/27/2011   Procedure: LAPAROSCOPIC CHOLECYSTECTOMY WITH INTRAOPERATIVE CHOLANGIOGRAM;  Surgeon: Fabio Bering, MD;  Location: [facility];  Service: General;  Laterality: N/A;  . ERCP  02/16/2011   Procedure: ENDOSCOPIC RETROGRADE CHOLANGIOPANCREATOGRAPHY (ERCP);  Surgeon: Malissa Hippo, MD;  Location: [facility];  Service: Endoscopy;  Laterality: N/A;  7:30/with Stent Removal  . ERCP  10/08/2010  . ERCP  04/20/2011   Procedure: ENDOSCOPIC RETROGRADE CHOLANGIOPANCREATOGRAPHY (ERCP);  Surgeon: Malissa Hippo, MD;  Location: [facility];  Service: Endoscopy;  Laterality: N/A;  removal of stent, removal of multiple stones with basket  . ERCP  09/14/2011   Procedure: ENDOSCOPIC RETROGRADE  CHOLANGIOPANCREATOGRAPHY (ERCP);  Surgeon: Malissa Hippo, MD;  Location: [facility];  Service: Endoscopy;  Laterality: N/A;  ERCP with Stent Removal  . HIP SURGERY Left November 20.2014   Left Hip Fracture- Done @ [facility]  . ORIF FEMUR FRACTURE Left 07/05/2014   Procedure: Left super condylar femur fracture;  Surgeon: Eldred Manges, MD;  Location[facility] OR;  Service: Orthopedics;  Laterality: Left;  . SPHINCTEROTOMY  02/16/2011   Procedure: SPHINCTEROTOMY;  Surgeon: Malissa Hippo, MD;  Location: [facility];  Service: Endoscopy;  Laterality: N/A;  . SPHINCTEROTOMY  09/14/2011   Procedure: SPHINCTEROTOMY;  Surgeon: Malissa Hippo, MD;  Location: [facility];  Service: Endoscopy;  Laterality: N/A;  . SPYGLASS CHOLANGIOSCOPY  04/20/2011   Procedure: SPYGLASS CHOLANGIOSCOPY;  Surgeon: Malissa Hippo, MD;  Location: [facility];  Service: Endoscopy;  Laterality: N/A;  . SPYGLASS CHOLANGIOSCOPY  09/14/2011   Procedure: SPYGLASS CHOLANGIOSCOPY;  Surgeon: Malissa Hippo, MD;  Location: [facility];  Service: Endoscopy;  Laterality: N/A;  . VAGINA SURGERY  as a teen   Social History   Occupational History  . Not on file.   Social History Main Topics  . Smoking status: Never Smoker  . Smokeless tobacco: Never Used  . Alcohol use No  . Drug use: No  . Sexual activity: Not Currently

## 2017-02-10 DIAGNOSIS — L89152 Pressure ulcer of sacral region, stage 2: Secondary | ICD-10-CM | POA: Diagnosis not present

## 2017-02-10 DIAGNOSIS — E119 Type 2 diabetes mellitus without complications: Secondary | ICD-10-CM | POA: Diagnosis not present

## 2017-02-10 DIAGNOSIS — S72401D Unspecified fracture of lower end of right femur, subsequent encounter for closed fracture with routine healing: Secondary | ICD-10-CM | POA: Diagnosis not present

## 2017-02-10 DIAGNOSIS — M810 Age-related osteoporosis without current pathological fracture: Secondary | ICD-10-CM | POA: Diagnosis not present

## 2017-02-10 DIAGNOSIS — D869 Sarcoidosis, unspecified: Secondary | ICD-10-CM | POA: Diagnosis not present

## 2017-02-10 DIAGNOSIS — M85851 Other specified disorders of bone density and structure, right thigh: Secondary | ICD-10-CM | POA: Diagnosis not present

## 2017-02-13 DIAGNOSIS — S72401D Unspecified fracture of lower end of right femur, subsequent encounter for closed fracture with routine healing: Secondary | ICD-10-CM | POA: Diagnosis not present

## 2017-02-13 DIAGNOSIS — D869 Sarcoidosis, unspecified: Secondary | ICD-10-CM | POA: Diagnosis not present

## 2017-02-13 DIAGNOSIS — E119 Type 2 diabetes mellitus without complications: Secondary | ICD-10-CM | POA: Diagnosis not present

## 2017-02-13 DIAGNOSIS — M810 Age-related osteoporosis without current pathological fracture: Secondary | ICD-10-CM | POA: Diagnosis not present

## 2017-02-13 DIAGNOSIS — L89152 Pressure ulcer of sacral region, stage 2: Secondary | ICD-10-CM | POA: Diagnosis not present

## 2017-02-13 DIAGNOSIS — M85851 Other specified disorders of bone density and structure, right thigh: Secondary | ICD-10-CM | POA: Diagnosis not present

## 2017-02-14 DIAGNOSIS — M85851 Other specified disorders of bone density and structure, right thigh: Secondary | ICD-10-CM | POA: Diagnosis not present

## 2017-02-14 DIAGNOSIS — M810 Age-related osteoporosis without current pathological fracture: Secondary | ICD-10-CM | POA: Diagnosis not present

## 2017-02-14 DIAGNOSIS — D869 Sarcoidosis, unspecified: Secondary | ICD-10-CM | POA: Diagnosis not present

## 2017-02-14 DIAGNOSIS — E119 Type 2 diabetes mellitus without complications: Secondary | ICD-10-CM | POA: Diagnosis not present

## 2017-02-14 DIAGNOSIS — L89152 Pressure ulcer of sacral region, stage 2: Secondary | ICD-10-CM | POA: Diagnosis not present

## 2017-02-14 DIAGNOSIS — S72401D Unspecified fracture of lower end of right femur, subsequent encounter for closed fracture with routine healing: Secondary | ICD-10-CM | POA: Diagnosis not present

## 2017-02-15 ENCOUNTER — Telehealth (INDEPENDENT_AMBULATORY_CARE_PROVIDER_SITE_OTHER): Payer: Self-pay | Admitting: Radiology

## 2017-02-15 DIAGNOSIS — S72401D Unspecified fracture of lower end of right femur, subsequent encounter for closed fracture with routine healing: Secondary | ICD-10-CM | POA: Diagnosis not present

## 2017-02-15 DIAGNOSIS — M810 Age-related osteoporosis without current pathological fracture: Secondary | ICD-10-CM | POA: Diagnosis not present

## 2017-02-15 DIAGNOSIS — M85851 Other specified disorders of bone density and structure, right thigh: Secondary | ICD-10-CM | POA: Diagnosis not present

## 2017-02-15 DIAGNOSIS — E119 Type 2 diabetes mellitus without complications: Secondary | ICD-10-CM | POA: Diagnosis not present

## 2017-02-15 DIAGNOSIS — D869 Sarcoidosis, unspecified: Secondary | ICD-10-CM | POA: Diagnosis not present

## 2017-02-15 DIAGNOSIS — L89152 Pressure ulcer of sacral region, stage 2: Secondary | ICD-10-CM | POA: Diagnosis not present

## 2017-02-15 NOTE — Telephone Encounter (Signed)
I called patient's son and discussed with him Medicare rules for necessity of services and their requirements. Her fracture is healing as expected on previous x-ray and I'll see her for follow-up as scheduled . He understands the previous x-ray showed interval healing of the fracture and she just needs a little bit more time to finish healing. He understands. FYI

## 2017-02-15 NOTE — Telephone Encounter (Signed)
Patient's son called and states home health is dropping his mother's PT and OT visits to 1-2 x per week. She has been having OT come out twice and PT 3 x per week. He would like for you to increase the amount of times that they are coming out as he feels she will do much better with what they have done previously.  CB  [phone]

## 2017-02-16 ENCOUNTER — Ambulatory Visit (INDEPENDENT_AMBULATORY_CARE_PROVIDER_SITE_OTHER): Payer: Medicare Other | Admitting: Orthopaedic Surgery

## 2017-02-16 DIAGNOSIS — S72401D Unspecified fracture of lower end of right femur, subsequent encounter for closed fracture with routine healing: Secondary | ICD-10-CM | POA: Diagnosis not present

## 2017-02-16 DIAGNOSIS — L89152 Pressure ulcer of sacral region, stage 2: Secondary | ICD-10-CM | POA: Diagnosis not present

## 2017-02-16 DIAGNOSIS — E119 Type 2 diabetes mellitus without complications: Secondary | ICD-10-CM | POA: Diagnosis not present

## 2017-02-16 DIAGNOSIS — D869 Sarcoidosis, unspecified: Secondary | ICD-10-CM | POA: Diagnosis not present

## 2017-02-16 DIAGNOSIS — M85851 Other specified disorders of bone density and structure, right thigh: Secondary | ICD-10-CM | POA: Diagnosis not present

## 2017-02-16 DIAGNOSIS — M810 Age-related osteoporosis without current pathological fracture: Secondary | ICD-10-CM | POA: Diagnosis not present

## 2017-02-16 NOTE — Telephone Encounter (Signed)
noted 

## 2017-02-20 DIAGNOSIS — M85851 Other specified disorders of bone density and structure, right thigh: Secondary | ICD-10-CM | POA: Diagnosis not present

## 2017-02-20 DIAGNOSIS — M810 Age-related osteoporosis without current pathological fracture: Secondary | ICD-10-CM | POA: Diagnosis not present

## 2017-02-20 DIAGNOSIS — L89152 Pressure ulcer of sacral region, stage 2: Secondary | ICD-10-CM | POA: Diagnosis not present

## 2017-02-20 DIAGNOSIS — S72401D Unspecified fracture of lower end of right femur, subsequent encounter for closed fracture with routine healing: Secondary | ICD-10-CM | POA: Diagnosis not present

## 2017-02-20 DIAGNOSIS — E119 Type 2 diabetes mellitus without complications: Secondary | ICD-10-CM | POA: Diagnosis not present

## 2017-02-20 DIAGNOSIS — D869 Sarcoidosis, unspecified: Secondary | ICD-10-CM | POA: Diagnosis not present

## 2017-02-21 DIAGNOSIS — E119 Type 2 diabetes mellitus without complications: Secondary | ICD-10-CM | POA: Diagnosis not present

## 2017-02-21 DIAGNOSIS — S72401D Unspecified fracture of lower end of right femur, subsequent encounter for closed fracture with routine healing: Secondary | ICD-10-CM | POA: Diagnosis not present

## 2017-02-21 DIAGNOSIS — D869 Sarcoidosis, unspecified: Secondary | ICD-10-CM | POA: Diagnosis not present

## 2017-02-21 DIAGNOSIS — L89152 Pressure ulcer of sacral region, stage 2: Secondary | ICD-10-CM | POA: Diagnosis not present

## 2017-02-21 DIAGNOSIS — M810 Age-related osteoporosis without current pathological fracture: Secondary | ICD-10-CM | POA: Diagnosis not present

## 2017-02-21 DIAGNOSIS — M85851 Other specified disorders of bone density and structure, right thigh: Secondary | ICD-10-CM | POA: Diagnosis not present

## 2017-02-23 DIAGNOSIS — M810 Age-related osteoporosis without current pathological fracture: Secondary | ICD-10-CM | POA: Diagnosis not present

## 2017-02-23 DIAGNOSIS — D869 Sarcoidosis, unspecified: Secondary | ICD-10-CM | POA: Diagnosis not present

## 2017-02-23 DIAGNOSIS — L89152 Pressure ulcer of sacral region, stage 2: Secondary | ICD-10-CM | POA: Diagnosis not present

## 2017-02-23 DIAGNOSIS — S72401D Unspecified fracture of lower end of right femur, subsequent encounter for closed fracture with routine healing: Secondary | ICD-10-CM | POA: Diagnosis not present

## 2017-02-23 DIAGNOSIS — E119 Type 2 diabetes mellitus without complications: Secondary | ICD-10-CM | POA: Diagnosis not present

## 2017-02-23 DIAGNOSIS — M85851 Other specified disorders of bone density and structure, right thigh: Secondary | ICD-10-CM | POA: Diagnosis not present

## 2017-02-27 DIAGNOSIS — L89152 Pressure ulcer of sacral region, stage 2: Secondary | ICD-10-CM | POA: Diagnosis not present

## 2017-02-27 DIAGNOSIS — M810 Age-related osteoporosis without current pathological fracture: Secondary | ICD-10-CM | POA: Diagnosis not present

## 2017-02-27 DIAGNOSIS — M85851 Other specified disorders of bone density and structure, right thigh: Secondary | ICD-10-CM | POA: Diagnosis not present

## 2017-02-27 DIAGNOSIS — S72401D Unspecified fracture of lower end of right femur, subsequent encounter for closed fracture with routine healing: Secondary | ICD-10-CM | POA: Diagnosis not present

## 2017-02-27 DIAGNOSIS — D869 Sarcoidosis, unspecified: Secondary | ICD-10-CM | POA: Diagnosis not present

## 2017-02-27 DIAGNOSIS — E119 Type 2 diabetes mellitus without complications: Secondary | ICD-10-CM | POA: Diagnosis not present

## 2017-02-28 DIAGNOSIS — M85851 Other specified disorders of bone density and structure, right thigh: Secondary | ICD-10-CM | POA: Diagnosis not present

## 2017-02-28 DIAGNOSIS — L89152 Pressure ulcer of sacral region, stage 2: Secondary | ICD-10-CM | POA: Diagnosis not present

## 2017-02-28 DIAGNOSIS — E119 Type 2 diabetes mellitus without complications: Secondary | ICD-10-CM | POA: Diagnosis not present

## 2017-02-28 DIAGNOSIS — D869 Sarcoidosis, unspecified: Secondary | ICD-10-CM | POA: Diagnosis not present

## 2017-02-28 DIAGNOSIS — M810 Age-related osteoporosis without current pathological fracture: Secondary | ICD-10-CM | POA: Diagnosis not present

## 2017-02-28 DIAGNOSIS — S72401D Unspecified fracture of lower end of right femur, subsequent encounter for closed fracture with routine healing: Secondary | ICD-10-CM | POA: Diagnosis not present

## 2017-03-01 DIAGNOSIS — S72401D Unspecified fracture of lower end of right femur, subsequent encounter for closed fracture with routine healing: Secondary | ICD-10-CM | POA: Diagnosis not present

## 2017-03-01 DIAGNOSIS — E119 Type 2 diabetes mellitus without complications: Secondary | ICD-10-CM | POA: Diagnosis not present

## 2017-03-01 DIAGNOSIS — M85851 Other specified disorders of bone density and structure, right thigh: Secondary | ICD-10-CM | POA: Diagnosis not present

## 2017-03-01 DIAGNOSIS — M810 Age-related osteoporosis without current pathological fracture: Secondary | ICD-10-CM | POA: Diagnosis not present

## 2017-03-01 DIAGNOSIS — L89152 Pressure ulcer of sacral region, stage 2: Secondary | ICD-10-CM | POA: Diagnosis not present

## 2017-03-01 DIAGNOSIS — D869 Sarcoidosis, unspecified: Secondary | ICD-10-CM | POA: Diagnosis not present

## 2017-03-02 DIAGNOSIS — M85851 Other specified disorders of bone density and structure, right thigh: Secondary | ICD-10-CM | POA: Diagnosis not present

## 2017-03-02 DIAGNOSIS — M810 Age-related osteoporosis without current pathological fracture: Secondary | ICD-10-CM | POA: Diagnosis not present

## 2017-03-02 DIAGNOSIS — D869 Sarcoidosis, unspecified: Secondary | ICD-10-CM | POA: Diagnosis not present

## 2017-03-02 DIAGNOSIS — L89152 Pressure ulcer of sacral region, stage 2: Secondary | ICD-10-CM | POA: Diagnosis not present

## 2017-03-02 DIAGNOSIS — E119 Type 2 diabetes mellitus without complications: Secondary | ICD-10-CM | POA: Diagnosis not present

## 2017-03-02 DIAGNOSIS — S72401D Unspecified fracture of lower end of right femur, subsequent encounter for closed fracture with routine healing: Secondary | ICD-10-CM | POA: Diagnosis not present

## 2017-03-03 DIAGNOSIS — L89152 Pressure ulcer of sacral region, stage 2: Secondary | ICD-10-CM | POA: Diagnosis not present

## 2017-03-03 DIAGNOSIS — M810 Age-related osteoporosis without current pathological fracture: Secondary | ICD-10-CM | POA: Diagnosis not present

## 2017-03-03 DIAGNOSIS — D869 Sarcoidosis, unspecified: Secondary | ICD-10-CM | POA: Diagnosis not present

## 2017-03-03 DIAGNOSIS — M85851 Other specified disorders of bone density and structure, right thigh: Secondary | ICD-10-CM | POA: Diagnosis not present

## 2017-03-03 DIAGNOSIS — E119 Type 2 diabetes mellitus without complications: Secondary | ICD-10-CM | POA: Diagnosis not present

## 2017-03-03 DIAGNOSIS — S72401D Unspecified fracture of lower end of right femur, subsequent encounter for closed fracture with routine healing: Secondary | ICD-10-CM | POA: Diagnosis not present

## 2017-03-06 DIAGNOSIS — M810 Age-related osteoporosis without current pathological fracture: Secondary | ICD-10-CM | POA: Diagnosis not present

## 2017-03-06 DIAGNOSIS — S72401D Unspecified fracture of lower end of right femur, subsequent encounter for closed fracture with routine healing: Secondary | ICD-10-CM | POA: Diagnosis not present

## 2017-03-06 DIAGNOSIS — E119 Type 2 diabetes mellitus without complications: Secondary | ICD-10-CM | POA: Diagnosis not present

## 2017-03-06 DIAGNOSIS — D869 Sarcoidosis, unspecified: Secondary | ICD-10-CM | POA: Diagnosis not present

## 2017-03-06 DIAGNOSIS — L89152 Pressure ulcer of sacral region, stage 2: Secondary | ICD-10-CM | POA: Diagnosis not present

## 2017-03-06 DIAGNOSIS — M85851 Other specified disorders of bone density and structure, right thigh: Secondary | ICD-10-CM | POA: Diagnosis not present

## 2017-03-07 DIAGNOSIS — S72401D Unspecified fracture of lower end of right femur, subsequent encounter for closed fracture with routine healing: Secondary | ICD-10-CM | POA: Diagnosis not present

## 2017-03-07 DIAGNOSIS — L89152 Pressure ulcer of sacral region, stage 2: Secondary | ICD-10-CM | POA: Diagnosis not present

## 2017-03-07 DIAGNOSIS — E119 Type 2 diabetes mellitus without complications: Secondary | ICD-10-CM | POA: Diagnosis not present

## 2017-03-07 DIAGNOSIS — M85851 Other specified disorders of bone density and structure, right thigh: Secondary | ICD-10-CM | POA: Diagnosis not present

## 2017-03-07 DIAGNOSIS — D869 Sarcoidosis, unspecified: Secondary | ICD-10-CM | POA: Diagnosis not present

## 2017-03-07 DIAGNOSIS — M810 Age-related osteoporosis without current pathological fracture: Secondary | ICD-10-CM | POA: Diagnosis not present

## 2017-03-08 DIAGNOSIS — S72401D Unspecified fracture of lower end of right femur, subsequent encounter for closed fracture with routine healing: Secondary | ICD-10-CM | POA: Diagnosis not present

## 2017-03-08 DIAGNOSIS — L89152 Pressure ulcer of sacral region, stage 2: Secondary | ICD-10-CM | POA: Diagnosis not present

## 2017-03-08 DIAGNOSIS — M85851 Other specified disorders of bone density and structure, right thigh: Secondary | ICD-10-CM | POA: Diagnosis not present

## 2017-03-08 DIAGNOSIS — M810 Age-related osteoporosis without current pathological fracture: Secondary | ICD-10-CM | POA: Diagnosis not present

## 2017-03-08 DIAGNOSIS — D869 Sarcoidosis, unspecified: Secondary | ICD-10-CM | POA: Diagnosis not present

## 2017-03-08 DIAGNOSIS — E119 Type 2 diabetes mellitus without complications: Secondary | ICD-10-CM | POA: Diagnosis not present

## 2017-03-09 ENCOUNTER — Ambulatory Visit (INDEPENDENT_AMBULATORY_CARE_PROVIDER_SITE_OTHER): Payer: Medicare Other

## 2017-03-09 ENCOUNTER — Encounter (INDEPENDENT_AMBULATORY_CARE_PROVIDER_SITE_OTHER): Payer: Self-pay | Admitting: Orthopaedic Surgery

## 2017-03-09 ENCOUNTER — Ambulatory Visit (INDEPENDENT_AMBULATORY_CARE_PROVIDER_SITE_OTHER): Payer: Medicare Other | Admitting: Orthopaedic Surgery

## 2017-03-09 DIAGNOSIS — S72401D Unspecified fracture of lower end of right femur, subsequent encounter for closed fracture with routine healing: Secondary | ICD-10-CM

## 2017-03-09 NOTE — Progress Notes (Signed)
   Post-Op Visit Note   Patient: Misty [name]           Date of Birth: January 20, 1930           MRN: [id number] Visit Date: 03/09/2017 PCP: Richardean Chimeraaniel, Terry G, MD   Assessment & Plan: We will up right distal femur fracture treated conservatively.  Fracture is not moving today x-rays demonstrate interval healing.  Slight valgus she is a minimal ambulator.  Chief Complaint:  Chief Complaint  Patient presents with  . Right Leg - Follow-up, Fracture   Visit Diagnoses:  1. Closed fracture of distal end of right femur with routine healing, subsequent encounter     Plan: Is no motion at the fracture site she can do a leg lift with the splint removed comfortably she has 20-50 degrees range of motion without pain.  No knee effusion noted.  She can begin 50% weightbearing with a knee immobilizer.  He can remove it to work on gentle knee range of motion.  Follow-Up Instructions: No Follow-up on file.   Orders:  Orders Placed This Encounter  Procedures  . XR FEMUR, MIN 2 VIEWS RIGHT   No orders of the defined types were placed in this encounter.   Imaging: No results found.  PMFS History: Patient Active Problem List   Diagnosis Date Noted  . Diabetes mellitus, type 2 (HCC) 07/05/2014  . GERD (gastroesophageal reflux disease) 07/05/2014  . Fracture of femur, distal, left, closed (HCC) 07/05/2014  . Osteoporosis 07/05/2014  . Hyponatremia 07/05/2014  . Anorexia 11/28/2012  . Hypothyroidism 11/28/2012  . Mirizzi's syndrome (h/o post cholecystectomy) 06/27/2011  . Sarcoidosis 06/27/2011   Past Medical History:  Diagnosis Date  . Anorexia   . Biliary obstruction   . Congestive heart failure [facility] December[facility] 2012   [facility]  . Diabetes mellitus   . GERD (gastroesophageal reflux disease)   . Hypothyroidism   . Jaundice   . Sarcoidosis   . Weight loss     Family History  Problem Relation Age of Onset  . Anesthesia problems Neg Hx   . Hypotension Neg Hx    . Malignant hyperthermia Neg Hx   . Pseudochol deficiency Neg Hx   . Scoliosis Son   . Hypertension Son   . Sinusitis Son   . Healthy Son     Past Surgical History:  Procedure Laterality Date  . APPENDECTOMY    . ERCP  10/08/2010  . HIP SURGERY Left November 20.2014   Left Hip Fracture- Done @ [facility]  . VAGINA SURGERY  as a teen   Social History   Occupational History  . Not on file  Tobacco Use  . Smoking status: Never Smoker  . Smokeless tobacco: Never Used  Substance and Sexual Activity  . Alcohol use: No  . Drug use: No  . Sexual activity: Not Currently

## 2017-03-10 DIAGNOSIS — M85851 Other specified disorders of bone density and structure, right thigh: Secondary | ICD-10-CM | POA: Diagnosis not present

## 2017-03-10 DIAGNOSIS — L89152 Pressure ulcer of sacral region, stage 2: Secondary | ICD-10-CM | POA: Diagnosis not present

## 2017-03-10 DIAGNOSIS — E119 Type 2 diabetes mellitus without complications: Secondary | ICD-10-CM | POA: Diagnosis not present

## 2017-03-10 DIAGNOSIS — D869 Sarcoidosis, unspecified: Secondary | ICD-10-CM | POA: Diagnosis not present

## 2017-03-10 DIAGNOSIS — S72401D Unspecified fracture of lower end of right femur, subsequent encounter for closed fracture with routine healing: Secondary | ICD-10-CM | POA: Diagnosis not present

## 2017-03-10 DIAGNOSIS — M810 Age-related osteoporosis without current pathological fracture: Secondary | ICD-10-CM | POA: Diagnosis not present

## 2017-03-13 DIAGNOSIS — M810 Age-related osteoporosis without current pathological fracture: Secondary | ICD-10-CM | POA: Diagnosis not present

## 2017-03-13 DIAGNOSIS — D869 Sarcoidosis, unspecified: Secondary | ICD-10-CM | POA: Diagnosis not present

## 2017-03-13 DIAGNOSIS — M85851 Other specified disorders of bone density and structure, right thigh: Secondary | ICD-10-CM | POA: Diagnosis not present

## 2017-03-13 DIAGNOSIS — E119 Type 2 diabetes mellitus without complications: Secondary | ICD-10-CM | POA: Diagnosis not present

## 2017-03-13 DIAGNOSIS — S72401D Unspecified fracture of lower end of right femur, subsequent encounter for closed fracture with routine healing: Secondary | ICD-10-CM | POA: Diagnosis not present

## 2017-03-13 DIAGNOSIS — L89152 Pressure ulcer of sacral region, stage 2: Secondary | ICD-10-CM | POA: Diagnosis not present

## 2017-03-14 DIAGNOSIS — D869 Sarcoidosis, unspecified: Secondary | ICD-10-CM | POA: Diagnosis not present

## 2017-03-14 DIAGNOSIS — L89152 Pressure ulcer of sacral region, stage 2: Secondary | ICD-10-CM | POA: Diagnosis not present

## 2017-03-14 DIAGNOSIS — M810 Age-related osteoporosis without current pathological fracture: Secondary | ICD-10-CM | POA: Diagnosis not present

## 2017-03-14 DIAGNOSIS — S72401D Unspecified fracture of lower end of right femur, subsequent encounter for closed fracture with routine healing: Secondary | ICD-10-CM | POA: Diagnosis not present

## 2017-03-14 DIAGNOSIS — E119 Type 2 diabetes mellitus without complications: Secondary | ICD-10-CM | POA: Diagnosis not present

## 2017-03-14 DIAGNOSIS — M85851 Other specified disorders of bone density and structure, right thigh: Secondary | ICD-10-CM | POA: Diagnosis not present

## 2017-03-15 DIAGNOSIS — M810 Age-related osteoporosis without current pathological fracture: Secondary | ICD-10-CM | POA: Diagnosis not present

## 2017-03-15 DIAGNOSIS — E119 Type 2 diabetes mellitus without complications: Secondary | ICD-10-CM | POA: Diagnosis not present

## 2017-03-15 DIAGNOSIS — L89152 Pressure ulcer of sacral region, stage 2: Secondary | ICD-10-CM | POA: Diagnosis not present

## 2017-03-15 DIAGNOSIS — S72401D Unspecified fracture of lower end of right femur, subsequent encounter for closed fracture with routine healing: Secondary | ICD-10-CM | POA: Diagnosis not present

## 2017-03-15 DIAGNOSIS — M85851 Other specified disorders of bone density and structure, right thigh: Secondary | ICD-10-CM | POA: Diagnosis not present

## 2017-03-15 DIAGNOSIS — D869 Sarcoidosis, unspecified: Secondary | ICD-10-CM | POA: Diagnosis not present

## 2017-03-16 DIAGNOSIS — M810 Age-related osteoporosis without current pathological fracture: Secondary | ICD-10-CM | POA: Diagnosis not present

## 2017-03-16 DIAGNOSIS — S72401D Unspecified fracture of lower end of right femur, subsequent encounter for closed fracture with routine healing: Secondary | ICD-10-CM | POA: Diagnosis not present

## 2017-03-16 DIAGNOSIS — E119 Type 2 diabetes mellitus without complications: Secondary | ICD-10-CM | POA: Diagnosis not present

## 2017-03-16 DIAGNOSIS — M85851 Other specified disorders of bone density and structure, right thigh: Secondary | ICD-10-CM | POA: Diagnosis not present

## 2017-03-16 DIAGNOSIS — L89152 Pressure ulcer of sacral region, stage 2: Secondary | ICD-10-CM | POA: Diagnosis not present

## 2017-03-16 DIAGNOSIS — D869 Sarcoidosis, unspecified: Secondary | ICD-10-CM | POA: Diagnosis not present

## 2017-03-20 ENCOUNTER — Encounter (INDEPENDENT_AMBULATORY_CARE_PROVIDER_SITE_OTHER): Payer: Self-pay | Admitting: Orthopaedic Surgery

## 2017-03-20 DIAGNOSIS — L89152 Pressure ulcer of sacral region, stage 2: Secondary | ICD-10-CM | POA: Diagnosis not present

## 2017-03-20 DIAGNOSIS — D869 Sarcoidosis, unspecified: Secondary | ICD-10-CM | POA: Diagnosis not present

## 2017-03-20 DIAGNOSIS — S72401D Unspecified fracture of lower end of right femur, subsequent encounter for closed fracture with routine healing: Secondary | ICD-10-CM | POA: Diagnosis not present

## 2017-03-20 DIAGNOSIS — M85851 Other specified disorders of bone density and structure, right thigh: Secondary | ICD-10-CM | POA: Diagnosis not present

## 2017-03-20 DIAGNOSIS — E119 Type 2 diabetes mellitus without complications: Secondary | ICD-10-CM | POA: Diagnosis not present

## 2017-03-20 DIAGNOSIS — M810 Age-related osteoporosis without current pathological fracture: Secondary | ICD-10-CM | POA: Diagnosis not present

## 2017-03-22 DIAGNOSIS — L89152 Pressure ulcer of sacral region, stage 2: Secondary | ICD-10-CM | POA: Diagnosis not present

## 2017-03-22 DIAGNOSIS — M85851 Other specified disorders of bone density and structure, right thigh: Secondary | ICD-10-CM | POA: Diagnosis not present

## 2017-03-22 DIAGNOSIS — D869 Sarcoidosis, unspecified: Secondary | ICD-10-CM | POA: Diagnosis not present

## 2017-03-22 DIAGNOSIS — S72401D Unspecified fracture of lower end of right femur, subsequent encounter for closed fracture with routine healing: Secondary | ICD-10-CM | POA: Diagnosis not present

## 2017-03-22 DIAGNOSIS — M810 Age-related osteoporosis without current pathological fracture: Secondary | ICD-10-CM | POA: Diagnosis not present

## 2017-03-22 DIAGNOSIS — E119 Type 2 diabetes mellitus without complications: Secondary | ICD-10-CM | POA: Diagnosis not present

## 2017-03-24 DIAGNOSIS — M810 Age-related osteoporosis without current pathological fracture: Secondary | ICD-10-CM | POA: Diagnosis not present

## 2017-03-24 DIAGNOSIS — D869 Sarcoidosis, unspecified: Secondary | ICD-10-CM | POA: Diagnosis not present

## 2017-03-24 DIAGNOSIS — L89152 Pressure ulcer of sacral region, stage 2: Secondary | ICD-10-CM | POA: Diagnosis not present

## 2017-03-24 DIAGNOSIS — S72401D Unspecified fracture of lower end of right femur, subsequent encounter for closed fracture with routine healing: Secondary | ICD-10-CM | POA: Diagnosis not present

## 2017-03-24 DIAGNOSIS — M85851 Other specified disorders of bone density and structure, right thigh: Secondary | ICD-10-CM | POA: Diagnosis not present

## 2017-03-24 DIAGNOSIS — E119 Type 2 diabetes mellitus without complications: Secondary | ICD-10-CM | POA: Diagnosis not present

## 2017-03-27 DIAGNOSIS — S72401D Unspecified fracture of lower end of right femur, subsequent encounter for closed fracture with routine healing: Secondary | ICD-10-CM | POA: Diagnosis not present

## 2017-03-27 DIAGNOSIS — E119 Type 2 diabetes mellitus without complications: Secondary | ICD-10-CM | POA: Diagnosis not present

## 2017-03-27 DIAGNOSIS — M85851 Other specified disorders of bone density and structure, right thigh: Secondary | ICD-10-CM | POA: Diagnosis not present

## 2017-03-27 DIAGNOSIS — M810 Age-related osteoporosis without current pathological fracture: Secondary | ICD-10-CM | POA: Diagnosis not present

## 2017-03-27 DIAGNOSIS — D869 Sarcoidosis, unspecified: Secondary | ICD-10-CM | POA: Diagnosis not present

## 2017-03-27 DIAGNOSIS — L89152 Pressure ulcer of sacral region, stage 2: Secondary | ICD-10-CM | POA: Diagnosis not present

## 2017-03-28 DIAGNOSIS — S72401D Unspecified fracture of lower end of right femur, subsequent encounter for closed fracture with routine healing: Secondary | ICD-10-CM | POA: Diagnosis not present

## 2017-03-28 DIAGNOSIS — M85851 Other specified disorders of bone density and structure, right thigh: Secondary | ICD-10-CM | POA: Diagnosis not present

## 2017-03-28 DIAGNOSIS — L89152 Pressure ulcer of sacral region, stage 2: Secondary | ICD-10-CM | POA: Diagnosis not present

## 2017-03-28 DIAGNOSIS — E119 Type 2 diabetes mellitus without complications: Secondary | ICD-10-CM | POA: Diagnosis not present

## 2017-03-28 DIAGNOSIS — D869 Sarcoidosis, unspecified: Secondary | ICD-10-CM | POA: Diagnosis not present

## 2017-03-28 DIAGNOSIS — M810 Age-related osteoporosis without current pathological fracture: Secondary | ICD-10-CM | POA: Diagnosis not present

## 2017-03-29 DIAGNOSIS — E119 Type 2 diabetes mellitus without complications: Secondary | ICD-10-CM | POA: Diagnosis not present

## 2017-03-29 DIAGNOSIS — S72401D Unspecified fracture of lower end of right femur, subsequent encounter for closed fracture with routine healing: Secondary | ICD-10-CM | POA: Diagnosis not present

## 2017-03-29 DIAGNOSIS — L89152 Pressure ulcer of sacral region, stage 2: Secondary | ICD-10-CM | POA: Diagnosis not present

## 2017-03-29 DIAGNOSIS — M810 Age-related osteoporosis without current pathological fracture: Secondary | ICD-10-CM | POA: Diagnosis not present

## 2017-03-29 DIAGNOSIS — M85851 Other specified disorders of bone density and structure, right thigh: Secondary | ICD-10-CM | POA: Diagnosis not present

## 2017-03-29 DIAGNOSIS — D869 Sarcoidosis, unspecified: Secondary | ICD-10-CM | POA: Diagnosis not present

## 2017-03-30 DIAGNOSIS — S72401D Unspecified fracture of lower end of right femur, subsequent encounter for closed fracture with routine healing: Secondary | ICD-10-CM | POA: Diagnosis not present

## 2017-03-30 DIAGNOSIS — L89152 Pressure ulcer of sacral region, stage 2: Secondary | ICD-10-CM | POA: Diagnosis not present

## 2017-03-30 DIAGNOSIS — D869 Sarcoidosis, unspecified: Secondary | ICD-10-CM | POA: Diagnosis not present

## 2017-03-30 DIAGNOSIS — M810 Age-related osteoporosis without current pathological fracture: Secondary | ICD-10-CM | POA: Diagnosis not present

## 2017-03-30 DIAGNOSIS — E119 Type 2 diabetes mellitus without complications: Secondary | ICD-10-CM | POA: Diagnosis not present

## 2017-03-30 DIAGNOSIS — M85851 Other specified disorders of bone density and structure, right thigh: Secondary | ICD-10-CM | POA: Diagnosis not present

## 2017-03-31 DIAGNOSIS — M810 Age-related osteoporosis without current pathological fracture: Secondary | ICD-10-CM | POA: Diagnosis not present

## 2017-03-31 DIAGNOSIS — L89152 Pressure ulcer of sacral region, stage 2: Secondary | ICD-10-CM | POA: Diagnosis not present

## 2017-03-31 DIAGNOSIS — D869 Sarcoidosis, unspecified: Secondary | ICD-10-CM | POA: Diagnosis not present

## 2017-03-31 DIAGNOSIS — E119 Type 2 diabetes mellitus without complications: Secondary | ICD-10-CM | POA: Diagnosis not present

## 2017-03-31 DIAGNOSIS — S72401D Unspecified fracture of lower end of right femur, subsequent encounter for closed fracture with routine healing: Secondary | ICD-10-CM | POA: Diagnosis not present

## 2017-03-31 DIAGNOSIS — M85851 Other specified disorders of bone density and structure, right thigh: Secondary | ICD-10-CM | POA: Diagnosis not present

## 2017-04-01 DIAGNOSIS — Z8781 Personal history of (healed) traumatic fracture: Secondary | ICD-10-CM | POA: Diagnosis not present

## 2017-04-01 DIAGNOSIS — E039 Hypothyroidism, unspecified: Secondary | ICD-10-CM | POA: Diagnosis not present

## 2017-04-01 DIAGNOSIS — Z8701 Personal history of pneumonia (recurrent): Secondary | ICD-10-CM | POA: Diagnosis not present

## 2017-04-01 DIAGNOSIS — Z7984 Long term (current) use of oral hypoglycemic drugs: Secondary | ICD-10-CM | POA: Diagnosis not present

## 2017-04-01 DIAGNOSIS — S72401D Unspecified fracture of lower end of right femur, subsequent encounter for closed fracture with routine healing: Secondary | ICD-10-CM | POA: Diagnosis not present

## 2017-04-01 DIAGNOSIS — E119 Type 2 diabetes mellitus without complications: Secondary | ICD-10-CM | POA: Diagnosis not present

## 2017-04-01 DIAGNOSIS — I509 Heart failure, unspecified: Secondary | ICD-10-CM | POA: Diagnosis not present

## 2017-04-01 DIAGNOSIS — D869 Sarcoidosis, unspecified: Secondary | ICD-10-CM | POA: Diagnosis not present

## 2017-04-01 DIAGNOSIS — K219 Gastro-esophageal reflux disease without esophagitis: Secondary | ICD-10-CM | POA: Diagnosis not present

## 2017-04-01 DIAGNOSIS — Z79891 Long term (current) use of opiate analgesic: Secondary | ICD-10-CM | POA: Diagnosis not present

## 2017-04-01 DIAGNOSIS — M85851 Other specified disorders of bone density and structure, right thigh: Secondary | ICD-10-CM | POA: Diagnosis not present

## 2017-04-01 DIAGNOSIS — M810 Age-related osteoporosis without current pathological fracture: Secondary | ICD-10-CM | POA: Diagnosis not present

## 2017-04-03 DIAGNOSIS — D869 Sarcoidosis, unspecified: Secondary | ICD-10-CM | POA: Diagnosis not present

## 2017-04-03 DIAGNOSIS — S72401D Unspecified fracture of lower end of right femur, subsequent encounter for closed fracture with routine healing: Secondary | ICD-10-CM | POA: Diagnosis not present

## 2017-04-03 DIAGNOSIS — I509 Heart failure, unspecified: Secondary | ICD-10-CM | POA: Diagnosis not present

## 2017-04-03 DIAGNOSIS — M810 Age-related osteoporosis without current pathological fracture: Secondary | ICD-10-CM | POA: Diagnosis not present

## 2017-04-03 DIAGNOSIS — M85851 Other specified disorders of bone density and structure, right thigh: Secondary | ICD-10-CM | POA: Diagnosis not present

## 2017-04-03 DIAGNOSIS — E119 Type 2 diabetes mellitus without complications: Secondary | ICD-10-CM | POA: Diagnosis not present

## 2017-04-05 DIAGNOSIS — S72401D Unspecified fracture of lower end of right femur, subsequent encounter for closed fracture with routine healing: Secondary | ICD-10-CM | POA: Diagnosis not present

## 2017-04-05 DIAGNOSIS — M810 Age-related osteoporosis without current pathological fracture: Secondary | ICD-10-CM | POA: Diagnosis not present

## 2017-04-05 DIAGNOSIS — M85851 Other specified disorders of bone density and structure, right thigh: Secondary | ICD-10-CM | POA: Diagnosis not present

## 2017-04-05 DIAGNOSIS — D869 Sarcoidosis, unspecified: Secondary | ICD-10-CM | POA: Diagnosis not present

## 2017-04-05 DIAGNOSIS — E119 Type 2 diabetes mellitus without complications: Secondary | ICD-10-CM | POA: Diagnosis not present

## 2017-04-05 DIAGNOSIS — I509 Heart failure, unspecified: Secondary | ICD-10-CM | POA: Diagnosis not present

## 2017-04-07 DIAGNOSIS — S72401D Unspecified fracture of lower end of right femur, subsequent encounter for closed fracture with routine healing: Secondary | ICD-10-CM | POA: Diagnosis not present

## 2017-04-07 DIAGNOSIS — E119 Type 2 diabetes mellitus without complications: Secondary | ICD-10-CM | POA: Diagnosis not present

## 2017-04-07 DIAGNOSIS — M85851 Other specified disorders of bone density and structure, right thigh: Secondary | ICD-10-CM | POA: Diagnosis not present

## 2017-04-07 DIAGNOSIS — D869 Sarcoidosis, unspecified: Secondary | ICD-10-CM | POA: Diagnosis not present

## 2017-04-07 DIAGNOSIS — M810 Age-related osteoporosis without current pathological fracture: Secondary | ICD-10-CM | POA: Diagnosis not present

## 2017-04-07 DIAGNOSIS — I509 Heart failure, unspecified: Secondary | ICD-10-CM | POA: Diagnosis not present

## 2017-04-12 DIAGNOSIS — D869 Sarcoidosis, unspecified: Secondary | ICD-10-CM | POA: Diagnosis not present

## 2017-04-12 DIAGNOSIS — M85851 Other specified disorders of bone density and structure, right thigh: Secondary | ICD-10-CM | POA: Diagnosis not present

## 2017-04-12 DIAGNOSIS — I509 Heart failure, unspecified: Secondary | ICD-10-CM | POA: Diagnosis not present

## 2017-04-12 DIAGNOSIS — M810 Age-related osteoporosis without current pathological fracture: Secondary | ICD-10-CM | POA: Diagnosis not present

## 2017-04-12 DIAGNOSIS — E119 Type 2 diabetes mellitus without complications: Secondary | ICD-10-CM | POA: Diagnosis not present

## 2017-04-12 DIAGNOSIS — S72401D Unspecified fracture of lower end of right femur, subsequent encounter for closed fracture with routine healing: Secondary | ICD-10-CM | POA: Diagnosis not present

## 2017-04-14 DIAGNOSIS — M85851 Other specified disorders of bone density and structure, right thigh: Secondary | ICD-10-CM | POA: Diagnosis not present

## 2017-04-14 DIAGNOSIS — E119 Type 2 diabetes mellitus without complications: Secondary | ICD-10-CM | POA: Diagnosis not present

## 2017-04-14 DIAGNOSIS — D869 Sarcoidosis, unspecified: Secondary | ICD-10-CM | POA: Diagnosis not present

## 2017-04-14 DIAGNOSIS — I509 Heart failure, unspecified: Secondary | ICD-10-CM | POA: Diagnosis not present

## 2017-04-14 DIAGNOSIS — S72401D Unspecified fracture of lower end of right femur, subsequent encounter for closed fracture with routine healing: Secondary | ICD-10-CM | POA: Diagnosis not present

## 2017-04-14 DIAGNOSIS — M810 Age-related osteoporosis without current pathological fracture: Secondary | ICD-10-CM | POA: Diagnosis not present

## 2017-04-17 DIAGNOSIS — S72401D Unspecified fracture of lower end of right femur, subsequent encounter for closed fracture with routine healing: Secondary | ICD-10-CM | POA: Diagnosis not present

## 2017-04-17 DIAGNOSIS — I509 Heart failure, unspecified: Secondary | ICD-10-CM | POA: Diagnosis not present

## 2017-04-17 DIAGNOSIS — D869 Sarcoidosis, unspecified: Secondary | ICD-10-CM | POA: Diagnosis not present

## 2017-04-17 DIAGNOSIS — M810 Age-related osteoporosis without current pathological fracture: Secondary | ICD-10-CM | POA: Diagnosis not present

## 2017-04-17 DIAGNOSIS — E119 Type 2 diabetes mellitus without complications: Secondary | ICD-10-CM | POA: Diagnosis not present

## 2017-04-17 DIAGNOSIS — M85851 Other specified disorders of bone density and structure, right thigh: Secondary | ICD-10-CM | POA: Diagnosis not present

## 2017-04-19 DIAGNOSIS — I509 Heart failure, unspecified: Secondary | ICD-10-CM | POA: Diagnosis not present

## 2017-04-19 DIAGNOSIS — S72401D Unspecified fracture of lower end of right femur, subsequent encounter for closed fracture with routine healing: Secondary | ICD-10-CM | POA: Diagnosis not present

## 2017-04-19 DIAGNOSIS — E119 Type 2 diabetes mellitus without complications: Secondary | ICD-10-CM | POA: Diagnosis not present

## 2017-04-19 DIAGNOSIS — M810 Age-related osteoporosis without current pathological fracture: Secondary | ICD-10-CM | POA: Diagnosis not present

## 2017-04-19 DIAGNOSIS — D869 Sarcoidosis, unspecified: Secondary | ICD-10-CM | POA: Diagnosis not present

## 2017-04-19 DIAGNOSIS — M85851 Other specified disorders of bone density and structure, right thigh: Secondary | ICD-10-CM | POA: Diagnosis not present

## 2017-04-20 ENCOUNTER — Ambulatory Visit (INDEPENDENT_AMBULATORY_CARE_PROVIDER_SITE_OTHER): Payer: Medicare Other | Admitting: Orthopaedic Surgery

## 2017-04-20 ENCOUNTER — Encounter (INDEPENDENT_AMBULATORY_CARE_PROVIDER_SITE_OTHER): Payer: Self-pay | Admitting: Orthopaedic Surgery

## 2017-04-20 ENCOUNTER — Ambulatory Visit (INDEPENDENT_AMBULATORY_CARE_PROVIDER_SITE_OTHER): Payer: Medicare Other

## 2017-04-20 DIAGNOSIS — M25561 Pain in right knee: Secondary | ICD-10-CM

## 2017-04-20 DIAGNOSIS — S72401D Unspecified fracture of lower end of right femur, subsequent encounter for closed fracture with routine healing: Secondary | ICD-10-CM | POA: Diagnosis not present

## 2017-04-20 NOTE — Progress Notes (Signed)
Post-Op Visit Note   Patient: Misty [name]           Date of Birth: 1929-07-24           MRN: [id number] Visit Date: 04/20/2017 PCP: Richardean Chimeraaniel, Terry G, MD   Assessment & Plan: Months post right distal femur fracture.  She is been 50% weightbearing with walker.  She walks with her son in a large room in the house.  He has been using the knee immobilizer.  Chief Complaint:  Chief Complaint  Patient presents with  . Right Knee - Fracture, Follow-up   Visit Diagnoses:  1. Closed fracture of distal end of right femur with routine healing, unspecified fracture morphology, subsequent encounter     Plan: Pressure is healed she can discontinue the knee immobilizer and just use a knee sleeve.  Follow-up here will be on a as needed basis.  She will walk at least twice daily as much as she is able and then rest.  Continue calcium supplement and ambulation to help with her  osteoporosis.  Follow-Up Instructions: No Follow-up on file.   Orders:  Orders Placed This Encounter  Procedures  . XR Knee 1-2 Views Right   No orders of the defined types were placed in this encounter.   Imaging: Xr Knee 1-2 Views Right  Result Date: 04/20/2017 X-rays right knee obtained that shows healed femur fracture with some valgus. Impression: Healed supracondylar right femur fracture.   PMFS History: Patient Active Problem List   Diagnosis Date Noted  . Diabetes mellitus, type 2 (HCC) 07/05/2014  . GERD (gastroesophageal reflux disease) 07/05/2014  . Fracture of femur, distal, left, closed (HCC) 07/05/2014  . Osteoporosis 07/05/2014  . Hyponatremia 07/05/2014  . Anorexia 11/28/2012  . Hypothyroidism 11/28/2012  . Mirizzi's syndrome (h/o post cholecystectomy) 06/27/2011  . Sarcoidosis 06/27/2011   Past Medical History:  Diagnosis Date  . Anorexia   . Biliary obstruction   . Congestive heart failure [facility](HCC) December, 2012   [facility]  . Diabetes mellitus   . GERD  (gastroesophageal reflux disease)   . Hypothyroidism   . Jaundice   . Sarcoidosis   . Weight loss     Family History  Problem Relation Age of Onset  . Anesthesia problems Neg Hx   . Hypotension Neg Hx   . Malignant hyperthermia Neg Hx   . Pseudochol deficiency Neg Hx   . Scoliosis Son   . Hypertension Son   . Sinusitis Son   . Healthy Son     Past Surgical History:  Procedure Laterality Date  . APPENDECTOMY    . BILIARY STENT PLACEMENT  02/16/2011   Procedure: BILIARY STENT PLACEMENT;  Surgeon: Malissa HippoNajeeb U Rehman, MD;  Location: [facility];  Service: Endoscopy;  Laterality: N/A;  . BILIARY STENT PLACEMENT  04/20/2011   Procedure: BILIARY STENT PLACEMENT;  Surgeon: Malissa HippoNajeeb U Rehman, MD;  Location: [facility];  Service: Endoscopy;  Laterality: N/A;  . CHOLECYSTECTOMY  04/27/2011   Procedure: LAPAROSCOPIC CHOLECYSTECTOMY WITH INTRAOPERATIVE CHOLANGIOGRAM;  Surgeon: Fabio BeringBrent C Ziegler, MD;  Location: [facility];  Service: General;  Laterality: N/A;  . ERCP  02/16/2011   Procedure: ENDOSCOPIC RETROGRADE CHOLANGIOPANCREATOGRAPHY (ERCP);  Surgeon: Malissa HippoNajeeb U Rehman, MD;  Location: [facility];  Service: Endoscopy;  Laterality: N/A;  7:30/with Stent Removal  . ERCP  10/08/2010  . ERCP  04/20/2011   Procedure: ENDOSCOPIC RETROGRADE CHOLANGIOPANCREATOGRAPHY (ERCP);  Surgeon: Malissa HippoNajeeb U Rehman, MD;  Location: [facility];  Service: Endoscopy;  Laterality: N/A;  removal of stent, removal of multiple stones with basket  . ERCP  09/14/2011   Procedure: ENDOSCOPIC RETROGRADE CHOLANGIOPANCREATOGRAPHY (ERCP);  Surgeon: Malissa HippoNajeeb U Rehman, MD;  Location: [facility];  Service: Endoscopy;  Laterality: N/A;  ERCP with Stent Removal  . HIP SURGERY Left November 20.2014   Left Hip Fracture- Done @ [facility]  . ORIF FEMUR FRACTURE Left 07/05/2014   Procedure: Left super condylar femur fracture;  Surgeon: Eldred MangesMark C Kamdyn Covel, MD;  [facility];  Service: Orthopedics;  Laterality: Left;  . SPHINCTEROTOMY  02/16/2011   Procedure: SPHINCTEROTOMY;   Surgeon: Malissa HippoNajeeb U Rehman, MD;  Location: [facility];  Service: Endoscopy;  Laterality: N/A;  . SPHINCTEROTOMY  09/14/2011   Procedure: SPHINCTEROTOMY;  Surgeon: Malissa HippoNajeeb U Rehman, MD;  Location: [facility];  Service: Endoscopy;  Laterality: N/A;  . SPYGLASS CHOLANGIOSCOPY  04/20/2011   Procedure: SPYGLASS CHOLANGIOSCOPY;  Surgeon: Malissa HippoNajeeb U Rehman, MD;  [facility];  Service: Endoscopy;  Laterality: N/A;  . SPYGLASS CHOLANGIOSCOPY  09/14/2011   Procedure: SPYGLASS CHOLANGIOSCOPY;  Surgeon: Malissa HippoNajeeb U Rehman, MD;  Location: [facility];  Service: Endoscopy;  Laterality: N/A;  . VAGINA SURGERY  as a teen   Social History   Occupational History  . Not on file  Tobacco Use  . Smoking status: Never Smoker  . Smokeless tobacco: Never Used  Substance and Sexual Activity  . Alcohol use: No  . Drug use: No  . Sexual activity: Not Currently

## 2017-04-21 DIAGNOSIS — R531 Weakness: Secondary | ICD-10-CM | POA: Diagnosis not present

## 2017-04-21 DIAGNOSIS — D869 Sarcoidosis, unspecified: Secondary | ICD-10-CM | POA: Diagnosis not present

## 2017-04-21 DIAGNOSIS — S72401D Unspecified fracture of lower end of right femur, subsequent encounter for closed fracture with routine healing: Secondary | ICD-10-CM | POA: Diagnosis not present

## 2017-04-21 DIAGNOSIS — E039 Hypothyroidism, unspecified: Secondary | ICD-10-CM | POA: Diagnosis not present

## 2017-04-21 DIAGNOSIS — M85851 Other specified disorders of bone density and structure, right thigh: Secondary | ICD-10-CM | POA: Diagnosis not present

## 2017-04-21 DIAGNOSIS — I509 Heart failure, unspecified: Secondary | ICD-10-CM | POA: Diagnosis not present

## 2017-04-21 DIAGNOSIS — R5383 Other fatigue: Secondary | ICD-10-CM | POA: Diagnosis not present

## 2017-04-21 DIAGNOSIS — E119 Type 2 diabetes mellitus without complications: Secondary | ICD-10-CM | POA: Diagnosis not present

## 2017-04-21 DIAGNOSIS — Z9189 Other specified personal risk factors, not elsewhere classified: Secondary | ICD-10-CM | POA: Diagnosis not present

## 2017-04-21 DIAGNOSIS — J449 Chronic obstructive pulmonary disease, unspecified: Secondary | ICD-10-CM | POA: Diagnosis not present

## 2017-04-21 DIAGNOSIS — E1165 Type 2 diabetes mellitus with hyperglycemia: Secondary | ICD-10-CM | POA: Diagnosis not present

## 2017-04-21 DIAGNOSIS — E782 Mixed hyperlipidemia: Secondary | ICD-10-CM | POA: Diagnosis not present

## 2017-04-21 DIAGNOSIS — M810 Age-related osteoporosis without current pathological fracture: Secondary | ICD-10-CM | POA: Diagnosis not present

## 2017-04-24 DIAGNOSIS — D869 Sarcoidosis, unspecified: Secondary | ICD-10-CM | POA: Diagnosis not present

## 2017-04-24 DIAGNOSIS — E119 Type 2 diabetes mellitus without complications: Secondary | ICD-10-CM | POA: Diagnosis not present

## 2017-04-24 DIAGNOSIS — M85851 Other specified disorders of bone density and structure, right thigh: Secondary | ICD-10-CM | POA: Diagnosis not present

## 2017-04-24 DIAGNOSIS — M810 Age-related osteoporosis without current pathological fracture: Secondary | ICD-10-CM | POA: Diagnosis not present

## 2017-04-24 DIAGNOSIS — I509 Heart failure, unspecified: Secondary | ICD-10-CM | POA: Diagnosis not present

## 2017-04-24 DIAGNOSIS — S72401D Unspecified fracture of lower end of right femur, subsequent encounter for closed fracture with routine healing: Secondary | ICD-10-CM | POA: Diagnosis not present

## 2017-04-26 DIAGNOSIS — S72401D Unspecified fracture of lower end of right femur, subsequent encounter for closed fracture with routine healing: Secondary | ICD-10-CM | POA: Diagnosis not present

## 2017-04-26 DIAGNOSIS — I509 Heart failure, unspecified: Secondary | ICD-10-CM | POA: Diagnosis not present

## 2017-04-26 DIAGNOSIS — M85851 Other specified disorders of bone density and structure, right thigh: Secondary | ICD-10-CM | POA: Diagnosis not present

## 2017-04-26 DIAGNOSIS — D869 Sarcoidosis, unspecified: Secondary | ICD-10-CM | POA: Diagnosis not present

## 2017-04-26 DIAGNOSIS — M810 Age-related osteoporosis without current pathological fracture: Secondary | ICD-10-CM | POA: Diagnosis not present

## 2017-04-26 DIAGNOSIS — E119 Type 2 diabetes mellitus without complications: Secondary | ICD-10-CM | POA: Diagnosis not present

## 2017-04-27 DIAGNOSIS — Z9189 Other specified personal risk factors, not elsewhere classified: Secondary | ICD-10-CM | POA: Diagnosis not present

## 2017-04-27 DIAGNOSIS — Z681 Body mass index (BMI) 19 or less, adult: Secondary | ICD-10-CM | POA: Diagnosis not present

## 2017-04-27 DIAGNOSIS — E1165 Type 2 diabetes mellitus with hyperglycemia: Secondary | ICD-10-CM | POA: Diagnosis not present

## 2017-04-27 DIAGNOSIS — E119 Type 2 diabetes mellitus without complications: Secondary | ICD-10-CM | POA: Diagnosis not present

## 2017-04-27 DIAGNOSIS — E039 Hypothyroidism, unspecified: Secondary | ICD-10-CM | POA: Diagnosis not present

## 2017-04-27 DIAGNOSIS — E782 Mixed hyperlipidemia: Secondary | ICD-10-CM | POA: Diagnosis not present

## 2017-04-27 DIAGNOSIS — J449 Chronic obstructive pulmonary disease, unspecified: Secondary | ICD-10-CM | POA: Diagnosis not present

## 2017-04-27 DIAGNOSIS — M85851 Other specified disorders of bone density and structure, right thigh: Secondary | ICD-10-CM | POA: Diagnosis not present

## 2017-04-27 DIAGNOSIS — S72401D Unspecified fracture of lower end of right femur, subsequent encounter for closed fracture with routine healing: Secondary | ICD-10-CM | POA: Diagnosis not present

## 2017-04-27 DIAGNOSIS — M810 Age-related osteoporosis without current pathological fracture: Secondary | ICD-10-CM | POA: Diagnosis not present

## 2017-04-27 DIAGNOSIS — I509 Heart failure, unspecified: Secondary | ICD-10-CM | POA: Diagnosis not present

## 2017-04-27 DIAGNOSIS — D869 Sarcoidosis, unspecified: Secondary | ICD-10-CM | POA: Diagnosis not present

## 2017-04-27 DIAGNOSIS — I739 Peripheral vascular disease, unspecified: Secondary | ICD-10-CM | POA: Diagnosis not present

## 2017-05-01 DIAGNOSIS — I509 Heart failure, unspecified: Secondary | ICD-10-CM | POA: Diagnosis not present

## 2017-05-01 DIAGNOSIS — D869 Sarcoidosis, unspecified: Secondary | ICD-10-CM | POA: Diagnosis not present

## 2017-05-01 DIAGNOSIS — E119 Type 2 diabetes mellitus without complications: Secondary | ICD-10-CM | POA: Diagnosis not present

## 2017-05-01 DIAGNOSIS — S72401D Unspecified fracture of lower end of right femur, subsequent encounter for closed fracture with routine healing: Secondary | ICD-10-CM | POA: Diagnosis not present

## 2017-05-01 DIAGNOSIS — M810 Age-related osteoporosis without current pathological fracture: Secondary | ICD-10-CM | POA: Diagnosis not present

## 2017-05-01 DIAGNOSIS — M85851 Other specified disorders of bone density and structure, right thigh: Secondary | ICD-10-CM | POA: Diagnosis not present

## 2017-05-03 DIAGNOSIS — M79605 Pain in left leg: Secondary | ICD-10-CM | POA: Diagnosis not present

## 2017-05-03 DIAGNOSIS — M85851 Other specified disorders of bone density and structure, right thigh: Secondary | ICD-10-CM | POA: Diagnosis not present

## 2017-05-03 DIAGNOSIS — E119 Type 2 diabetes mellitus without complications: Secondary | ICD-10-CM | POA: Diagnosis not present

## 2017-05-03 DIAGNOSIS — M810 Age-related osteoporosis without current pathological fracture: Secondary | ICD-10-CM | POA: Diagnosis not present

## 2017-05-03 DIAGNOSIS — I509 Heart failure, unspecified: Secondary | ICD-10-CM | POA: Diagnosis not present

## 2017-05-03 DIAGNOSIS — S72401D Unspecified fracture of lower end of right femur, subsequent encounter for closed fracture with routine healing: Secondary | ICD-10-CM | POA: Diagnosis not present

## 2017-05-03 DIAGNOSIS — D869 Sarcoidosis, unspecified: Secondary | ICD-10-CM | POA: Diagnosis not present

## 2017-05-04 DIAGNOSIS — M25562 Pain in left knee: Secondary | ICD-10-CM | POA: Diagnosis not present

## 2017-05-04 DIAGNOSIS — S82142A Displaced bicondylar fracture of left tibia, initial encounter for closed fracture: Secondary | ICD-10-CM | POA: Diagnosis not present

## 2017-05-04 DIAGNOSIS — S72401D Unspecified fracture of lower end of right femur, subsequent encounter for closed fracture with routine healing: Secondary | ICD-10-CM | POA: Diagnosis not present

## 2017-05-04 DIAGNOSIS — R279 Unspecified lack of coordination: Secondary | ICD-10-CM | POA: Diagnosis not present

## 2017-05-04 DIAGNOSIS — E039 Hypothyroidism, unspecified: Secondary | ICD-10-CM | POA: Diagnosis not present

## 2017-05-04 DIAGNOSIS — K5900 Constipation, unspecified: Secondary | ICD-10-CM | POA: Diagnosis not present

## 2017-05-04 DIAGNOSIS — M810 Age-related osteoporosis without current pathological fracture: Secondary | ICD-10-CM | POA: Diagnosis not present

## 2017-05-04 DIAGNOSIS — Z79899 Other long term (current) drug therapy: Secondary | ICD-10-CM | POA: Diagnosis not present

## 2017-05-04 DIAGNOSIS — M85851 Other specified disorders of bone density and structure, right thigh: Secondary | ICD-10-CM | POA: Diagnosis not present

## 2017-05-04 DIAGNOSIS — Z7401 Bed confinement status: Secondary | ICD-10-CM | POA: Diagnosis not present

## 2017-05-04 DIAGNOSIS — E119 Type 2 diabetes mellitus without complications: Secondary | ICD-10-CM | POA: Diagnosis not present

## 2017-05-04 DIAGNOSIS — M25462 Effusion, left knee: Secondary | ICD-10-CM | POA: Diagnosis not present

## 2017-05-04 DIAGNOSIS — D869 Sarcoidosis, unspecified: Secondary | ICD-10-CM | POA: Diagnosis not present

## 2017-05-04 DIAGNOSIS — I509 Heart failure, unspecified: Secondary | ICD-10-CM | POA: Diagnosis not present

## 2017-05-04 DIAGNOSIS — W06XXXA Fall from bed, initial encounter: Secondary | ICD-10-CM | POA: Diagnosis not present

## 2017-05-08 ENCOUNTER — Telehealth (INDEPENDENT_AMBULATORY_CARE_PROVIDER_SITE_OTHER): Payer: Self-pay | Admitting: Orthopaedic Surgery

## 2017-05-08 DIAGNOSIS — D869 Sarcoidosis, unspecified: Secondary | ICD-10-CM | POA: Diagnosis not present

## 2017-05-08 DIAGNOSIS — S72401D Unspecified fracture of lower end of right femur, subsequent encounter for closed fracture with routine healing: Secondary | ICD-10-CM | POA: Diagnosis not present

## 2017-05-08 DIAGNOSIS — M85851 Other specified disorders of bone density and structure, right thigh: Secondary | ICD-10-CM | POA: Diagnosis not present

## 2017-05-08 DIAGNOSIS — M810 Age-related osteoporosis without current pathological fracture: Secondary | ICD-10-CM | POA: Diagnosis not present

## 2017-05-08 DIAGNOSIS — I509 Heart failure, unspecified: Secondary | ICD-10-CM | POA: Diagnosis not present

## 2017-05-08 DIAGNOSIS — E119 Type 2 diabetes mellitus without complications: Secondary | ICD-10-CM | POA: Diagnosis not present

## 2017-05-08 NOTE — Telephone Encounter (Signed)
Ok for Beazer HomesWBAT with walker. OK for knee ROM. Thanks ucall.

## 2017-05-08 NOTE — Telephone Encounter (Signed)
Please advise. At last visit, patient was 50% weightbearing with walker. She is to follow up prn.  Any restrictions?

## 2017-05-08 NOTE — Telephone Encounter (Signed)
[name] needing clarification on weight bearing status and range of motion for the patient. The number to contact Misty BlaseDebbie is [phone]

## 2017-05-09 DIAGNOSIS — E119 Type 2 diabetes mellitus without complications: Secondary | ICD-10-CM | POA: Diagnosis not present

## 2017-05-09 DIAGNOSIS — M85851 Other specified disorders of bone density and structure, right thigh: Secondary | ICD-10-CM | POA: Diagnosis not present

## 2017-05-09 DIAGNOSIS — I509 Heart failure, unspecified: Secondary | ICD-10-CM | POA: Diagnosis not present

## 2017-05-09 DIAGNOSIS — D869 Sarcoidosis, unspecified: Secondary | ICD-10-CM | POA: Diagnosis not present

## 2017-05-09 DIAGNOSIS — S72401D Unspecified fracture of lower end of right femur, subsequent encounter for closed fracture with routine healing: Secondary | ICD-10-CM | POA: Diagnosis not present

## 2017-05-09 DIAGNOSIS — M810 Age-related osteoporosis without current pathological fracture: Secondary | ICD-10-CM | POA: Diagnosis not present

## 2017-05-09 NOTE — Telephone Encounter (Signed)
I left voicemail for [name] advising. 

## 2017-05-11 ENCOUNTER — Telehealth (INDEPENDENT_AMBULATORY_CARE_PROVIDER_SITE_OTHER): Payer: Self-pay | Admitting: Orthopaedic Surgery

## 2017-05-11 DIAGNOSIS — M810 Age-related osteoporosis without current pathological fracture: Secondary | ICD-10-CM | POA: Diagnosis not present

## 2017-05-11 DIAGNOSIS — S72401D Unspecified fracture of lower end of right femur, subsequent encounter for closed fracture with routine healing: Secondary | ICD-10-CM | POA: Diagnosis not present

## 2017-05-11 DIAGNOSIS — D869 Sarcoidosis, unspecified: Secondary | ICD-10-CM | POA: Diagnosis not present

## 2017-05-11 DIAGNOSIS — I509 Heart failure, unspecified: Secondary | ICD-10-CM | POA: Diagnosis not present

## 2017-05-11 DIAGNOSIS — M85851 Other specified disorders of bone density and structure, right thigh: Secondary | ICD-10-CM | POA: Diagnosis not present

## 2017-05-11 DIAGNOSIS — E119 Type 2 diabetes mellitus without complications: Secondary | ICD-10-CM | POA: Diagnosis not present

## 2017-05-11 NOTE — Telephone Encounter (Signed)
error 

## 2017-05-11 NOTE — Telephone Encounter (Signed)
Are the orders concerning patients patellar fracture? She just wants clarification because she does not want to hurt the patient. Please advise [name] # [phone]

## 2017-05-11 NOTE — Telephone Encounter (Signed)
Attempted to return call.   Line busy. Will try again.  

## 2017-05-12 ENCOUNTER — Telehealth (INDEPENDENT_AMBULATORY_CARE_PROVIDER_SITE_OTHER): Payer: Self-pay | Admitting: Radiology

## 2017-05-12 DIAGNOSIS — E119 Type 2 diabetes mellitus without complications: Secondary | ICD-10-CM | POA: Diagnosis not present

## 2017-05-12 DIAGNOSIS — M85851 Other specified disorders of bone density and structure, right thigh: Secondary | ICD-10-CM | POA: Diagnosis not present

## 2017-05-12 DIAGNOSIS — S72401D Unspecified fracture of lower end of right femur, subsequent encounter for closed fracture with routine healing: Secondary | ICD-10-CM | POA: Diagnosis not present

## 2017-05-12 DIAGNOSIS — M810 Age-related osteoporosis without current pathological fracture: Secondary | ICD-10-CM | POA: Diagnosis not present

## 2017-05-12 DIAGNOSIS — D869 Sarcoidosis, unspecified: Secondary | ICD-10-CM | POA: Diagnosis not present

## 2017-05-12 DIAGNOSIS — I509 Heart failure, unspecified: Secondary | ICD-10-CM | POA: Diagnosis not present

## 2017-05-12 NOTE — Telephone Encounter (Signed)
[facility].

## 2017-05-12 NOTE — Telephone Encounter (Signed)
I left voicemail for [name] advising. 

## 2017-05-12 NOTE — Telephone Encounter (Signed)
Patient has new fracture since seen in the office last. [facility] and the son states he spoke with you and you put her back in the brace. Would you still like for PT to continue as planned with WBAT and knee ROM?

## 2017-05-12 NOTE — Telephone Encounter (Signed)
Come in this coming week thanks

## 2017-05-12 NOTE — Telephone Encounter (Signed)
Ucall, no PT until seen by me this coming week , make sure she has an appt thanks

## 2017-05-12 NOTE — Telephone Encounter (Signed)
Patient's son Trey PaulaJeff called and states he spoke with you on the phone regarding his mother bumping her knee and having a fracture. He states that she is back in the brace and would like to know when you want her to follow back up in the office for x-rays?   [facility] 05/04/17 [facility].  Please advise. CB [phone]

## 2017-05-16 DIAGNOSIS — S72401D Unspecified fracture of lower end of right femur, subsequent encounter for closed fracture with routine healing: Secondary | ICD-10-CM | POA: Diagnosis not present

## 2017-05-16 DIAGNOSIS — D869 Sarcoidosis, unspecified: Secondary | ICD-10-CM | POA: Diagnosis not present

## 2017-05-16 DIAGNOSIS — E119 Type 2 diabetes mellitus without complications: Secondary | ICD-10-CM | POA: Diagnosis not present

## 2017-05-16 DIAGNOSIS — I509 Heart failure, unspecified: Secondary | ICD-10-CM | POA: Diagnosis not present

## 2017-05-16 DIAGNOSIS — M85851 Other specified disorders of bone density and structure, right thigh: Secondary | ICD-10-CM | POA: Diagnosis not present

## 2017-05-16 DIAGNOSIS — M810 Age-related osteoporosis without current pathological fracture: Secondary | ICD-10-CM | POA: Diagnosis not present

## 2017-05-17 DIAGNOSIS — M85851 Other specified disorders of bone density and structure, right thigh: Secondary | ICD-10-CM | POA: Diagnosis not present

## 2017-05-17 DIAGNOSIS — E119 Type 2 diabetes mellitus without complications: Secondary | ICD-10-CM | POA: Diagnosis not present

## 2017-05-17 DIAGNOSIS — M810 Age-related osteoporosis without current pathological fracture: Secondary | ICD-10-CM | POA: Diagnosis not present

## 2017-05-17 DIAGNOSIS — D869 Sarcoidosis, unspecified: Secondary | ICD-10-CM | POA: Diagnosis not present

## 2017-05-17 DIAGNOSIS — S72401D Unspecified fracture of lower end of right femur, subsequent encounter for closed fracture with routine healing: Secondary | ICD-10-CM | POA: Diagnosis not present

## 2017-05-17 DIAGNOSIS — I509 Heart failure, unspecified: Secondary | ICD-10-CM | POA: Diagnosis not present

## 2017-05-18 ENCOUNTER — Encounter (INDEPENDENT_AMBULATORY_CARE_PROVIDER_SITE_OTHER): Payer: Self-pay | Admitting: Orthopaedic Surgery

## 2017-05-18 ENCOUNTER — Ambulatory Visit (INDEPENDENT_AMBULATORY_CARE_PROVIDER_SITE_OTHER): Payer: PRIVATE HEALTH INSURANCE

## 2017-05-18 ENCOUNTER — Ambulatory Visit (INDEPENDENT_AMBULATORY_CARE_PROVIDER_SITE_OTHER): Payer: PRIVATE HEALTH INSURANCE | Admitting: Orthopaedic Surgery

## 2017-05-18 VITALS — BP 154/76 | HR 86

## 2017-05-18 DIAGNOSIS — S72401D Unspecified fracture of lower end of right femur, subsequent encounter for closed fracture with routine healing: Secondary | ICD-10-CM | POA: Diagnosis not present

## 2017-05-18 DIAGNOSIS — M810 Age-related osteoporosis without current pathological fracture: Secondary | ICD-10-CM | POA: Diagnosis not present

## 2017-05-18 DIAGNOSIS — M85851 Other specified disorders of bone density and structure, right thigh: Secondary | ICD-10-CM | POA: Diagnosis not present

## 2017-05-18 DIAGNOSIS — I509 Heart failure, unspecified: Secondary | ICD-10-CM | POA: Diagnosis not present

## 2017-05-18 DIAGNOSIS — D869 Sarcoidosis, unspecified: Secondary | ICD-10-CM | POA: Diagnosis not present

## 2017-05-18 DIAGNOSIS — M25562 Pain in left knee: Secondary | ICD-10-CM

## 2017-05-18 DIAGNOSIS — S82132A Displaced fracture of medial condyle of left tibia, initial encounter for closed fracture: Secondary | ICD-10-CM

## 2017-05-18 DIAGNOSIS — E119 Type 2 diabetes mellitus without complications: Secondary | ICD-10-CM | POA: Diagnosis not present

## 2017-05-18 MED ORDER — ALENDRONATE SODIUM 70 MG PO TABS: 70.0000 mg | ORAL_TABLET | ORAL | 8 refills | Status: AC

## 2017-05-18 NOTE — Progress Notes (Signed)
Post-Op Visit Note   Patient: Misty [name]           Date of Birth: 02-01-1930           MRN: [id number] Visit Date: 05/18/2017 PCP: Richardean Chimeraaniel, Terry G, MD   Assessment & Plan: Patient's had multiple fractures.  Previous left distal supracondylar femur fracture fixed with lateral plate which is healed.  On 05/03/2017 she had another fall suffering a left medial displaced tibial plateau fracture.  She is brought by with her son today veins in the knee immobilizer.  She has significant severe osteoporosis.  We had discussed over the phone after I reviewed her x-rays after her fall options of surgery versus non-surgery and she elected to proceed with nonsurgical treatment.  Had contralateral femur fracture on the right in the past.  Home health physical therapy prescription written for transfer she can weight-bear as tolerated with the right lower extremity.  She is nonweightbearing with her left and will keep her knee immobilizer on and will not be able to begin knee range of motion until her fracture is healed.  Chief Complaint:  Chief Complaint  Patient presents with  . Left Knee - Fracture   Visit Diagnoses:  1. Acute pain of left knee   2. Closed fracture of medial portion of left tibial plateau, initial encounter     Plan: Knee immobilizer.  Return 4 weeks.  Repeat x-rays left knee to evaluate the medial tibial plateau fracture.  When she has adequate healing then we can start some gentle knee range of motion and quadricep strengthening exercises with progressive weightbearing in the left lower extremity.  Currently until her fracture heals she is nonweightbearing on the left lower extremity and will have to use her right lower extremity for transfers back and forth to the wheelchair.  We will start some Fosamax 70 mg once a week.  Follow-Up Instructions: Return in about 4 weeks (around 06/15/2017).   Orders:  Orders Placed This Encounter  Procedures  . XR Knee 1-2 Views Left   No  orders of the defined types were placed in this encounter.   Imaging: Xr Knee 1-2 Views Left  Result Date: 05/18/2017 Review x-rays demonstrates the medial tibial plateau fracture with some medial displacement and depression.  There is no change in position from the 05/04/2017 x-rays performed in the emergency room. Impression: Left medial tibial plateau fracture with displacement and depression.   PMFS History: Patient Active Problem List   Diagnosis Date Noted  . Diabetes mellitus, type 2 (HCC) 07/05/2014  . GERD (gastroesophageal reflux disease) 07/05/2014  . Fracture of femur, distal, left, closed (HCC) 07/05/2014  . Osteoporosis 07/05/2014  . Hyponatremia 07/05/2014  . Anorexia 11/28/2012  . Hypothyroidism 11/28/2012  . Mirizzi's syndrome (h/o post cholecystectomy) 06/27/2011  . Sarcoidosis 06/27/2011   Past Medical History:  Diagnosis Date  . Anorexia   . Biliary obstruction   . Congest[facility] heart [facility](HCC[facility] December[facility] 2012   [facility]  . Diabetes mellitus   . GERD (gastroesophageal reflux disease)   . Hypothyroidism   . Jaundice   . Sarcoidosis   . Weight loss     Family History  Problem Relation Age of Onset  . Anesthesia problems Neg Hx   . Hypotension Neg Hx   . Malignant hyperthermia Neg Hx   . Pseudochol deficiency Neg Hx   . Scoliosis Son   . Hypertension Son   . Sinusitis Son   . Healthy  Son     Past Surgical History:  Procedure Laterality Date  . APPENDECTOMY    . BILIARY STENT PLACEMENT  02/16/2011   Procedure: BILIARY STENT PLACEMENT;  Surgeon: Malissa Hippo, MD;  Location: [facility];  Service: Endoscopy;  Laterality: N/A;  . BILIARY STENT PLACEMENT  04/20/2011   Procedure: BILIARY STENT PLACEMENT;  Surgeon: Malissa Hippo, MD;  Location: [facility];  Service: Endoscopy;  Laterality: N/A;  . CHOLECYSTECTOMY  04/27/2011   Procedure: LAPAROSCOPIC CHOLECYSTECTOMY WITH INTRAOPERATIVE CHOLANGIOGRAM;  Surgeon: Fabio Bering, MD;   Location: [facility];  Service: General;  Laterality: N/A;  . ERCP  02/16/2011   Procedure: ENDOSCOPIC RETROGRADE CHOLANGIOPANCREATOGRAPHY (ERCP);  Surgeon: Malissa Hippo, MD;  Location: [facility];  Service: Endoscopy;  Laterality: N/A;  7:30/with Stent Removal  . ERCP  10/08/2010  . ERCP  04/20/2011   Procedure: ENDOSCOPIC RETROGRADE CHOLANGIOPANCREATOGRAPHY (ERCP);  Surgeon: Malissa Hippo, MD;  Location: [facility];  Service: Endoscopy;  Laterality: N/A;  removal of stent, removal of multiple stones with basket  . ERCP  09/14/2011   Procedure: ENDOSCOPIC RETROGRADE CHOLANGIOPANCREATOGRAPHY (ERCP);  Surgeon: Malissa Hippo, MD;  Location: [facility];  Service: Endoscopy;  Laterality: N/A;  ERCP with Stent Removal  . HIP SURGERY Left November 20.2014   Left Hip Fracture- Done @ [facility]  . ORIF FEMUR FRACTURE Left 07/05/2014   Procedure: Left super condylar femur fracture;  Surgeon: Eldred Manges, MD;  Location: [facility] OR;  Service: Orthopedics;  Laterality: Left;  . SPHINCTEROTOMY  02/16/2011   Procedure: SPHINCTEROTOMY;  Surgeon: Malissa Hippo, MD;  Location: [facility];  Service: Endoscopy;  Laterality: N/A;  . SPHINCTEROTOMY  09/14/2011   Procedure: SPHINCTEROTOMY;  Surgeon: Malissa Hippo, MD;  Location: [facility];  Service: Endoscopy;  Laterality: N/A;  . SPYGLASS CHOLANGIOSCOPY  04/20/2011   Procedure: SPYGLASS CHOLANGIOSCOPY;  Surgeon: Malissa Hippo, MD;  Location: [facility];  Service: Endoscopy;  Laterality: N/A;  . SPYGLASS CHOLANGIOSCOPY  09/14/2011   Procedure: SPYGLASS CHOLANGIOSCOPY;  Surgeon: Malissa Hippo, MD;  Location: [facility];  Service: Endoscopy;  Laterality: N/A;  . VAGINA SURGERY  as a teen   Social History   Occupational History  . Not on file  Tobacco Use  . Smoking status: Never Smoker  . Smokeless tobacco: Never Used  Substance and Sexual Activity  . Alcohol use: No  . Drug use: No  . Sexual activity: Not Currently

## 2017-05-19 DIAGNOSIS — E119 Type 2 diabetes mellitus without complications: Secondary | ICD-10-CM | POA: Diagnosis not present

## 2017-05-19 DIAGNOSIS — M810 Age-related osteoporosis without current pathological fracture: Secondary | ICD-10-CM | POA: Diagnosis not present

## 2017-05-19 DIAGNOSIS — S72401D Unspecified fracture of lower end of right femur, subsequent encounter for closed fracture with routine healing: Secondary | ICD-10-CM | POA: Diagnosis not present

## 2017-05-19 DIAGNOSIS — M85851 Other specified disorders of bone density and structure, right thigh: Secondary | ICD-10-CM | POA: Diagnosis not present

## 2017-05-19 DIAGNOSIS — I509 Heart failure, unspecified: Secondary | ICD-10-CM | POA: Diagnosis not present

## 2017-05-19 DIAGNOSIS — D869 Sarcoidosis, unspecified: Secondary | ICD-10-CM | POA: Diagnosis not present

## 2017-05-22 DIAGNOSIS — S72401D Unspecified fracture of lower end of right femur, subsequent encounter for closed fracture with routine healing: Secondary | ICD-10-CM | POA: Diagnosis not present

## 2017-05-22 DIAGNOSIS — E119 Type 2 diabetes mellitus without complications: Secondary | ICD-10-CM | POA: Diagnosis not present

## 2017-05-22 DIAGNOSIS — I509 Heart failure, unspecified: Secondary | ICD-10-CM | POA: Diagnosis not present

## 2017-05-22 DIAGNOSIS — D869 Sarcoidosis, unspecified: Secondary | ICD-10-CM | POA: Diagnosis not present

## 2017-05-22 DIAGNOSIS — M810 Age-related osteoporosis without current pathological fracture: Secondary | ICD-10-CM | POA: Diagnosis not present

## 2017-05-22 DIAGNOSIS — M85851 Other specified disorders of bone density and structure, right thigh: Secondary | ICD-10-CM | POA: Diagnosis not present

## 2017-05-23 DIAGNOSIS — M85851 Other specified disorders of bone density and structure, right thigh: Secondary | ICD-10-CM | POA: Diagnosis not present

## 2017-05-23 DIAGNOSIS — M810 Age-related osteoporosis without current pathological fracture: Secondary | ICD-10-CM | POA: Diagnosis not present

## 2017-05-23 DIAGNOSIS — S72401D Unspecified fracture of lower end of right femur, subsequent encounter for closed fracture with routine healing: Secondary | ICD-10-CM | POA: Diagnosis not present

## 2017-05-23 DIAGNOSIS — I509 Heart failure, unspecified: Secondary | ICD-10-CM | POA: Diagnosis not present

## 2017-05-23 DIAGNOSIS — D869 Sarcoidosis, unspecified: Secondary | ICD-10-CM | POA: Diagnosis not present

## 2017-05-23 DIAGNOSIS — E119 Type 2 diabetes mellitus without complications: Secondary | ICD-10-CM | POA: Diagnosis not present

## 2017-05-25 DIAGNOSIS — I509 Heart failure, unspecified: Secondary | ICD-10-CM | POA: Diagnosis not present

## 2017-05-25 DIAGNOSIS — D869 Sarcoidosis, unspecified: Secondary | ICD-10-CM | POA: Diagnosis not present

## 2017-05-25 DIAGNOSIS — S72401D Unspecified fracture of lower end of right femur, subsequent encounter for closed fracture with routine healing: Secondary | ICD-10-CM | POA: Diagnosis not present

## 2017-05-25 DIAGNOSIS — M85851 Other specified disorders of bone density and structure, right thigh: Secondary | ICD-10-CM | POA: Diagnosis not present

## 2017-05-25 DIAGNOSIS — M810 Age-related osteoporosis without current pathological fracture: Secondary | ICD-10-CM | POA: Diagnosis not present

## 2017-05-25 DIAGNOSIS — E119 Type 2 diabetes mellitus without complications: Secondary | ICD-10-CM | POA: Diagnosis not present

## 2017-06-22 ENCOUNTER — Ambulatory Visit (INDEPENDENT_AMBULATORY_CARE_PROVIDER_SITE_OTHER): Payer: Medicare Other | Admitting: Orthopaedic Surgery

## 2017-06-22 ENCOUNTER — Ambulatory Visit (INDEPENDENT_AMBULATORY_CARE_PROVIDER_SITE_OTHER): Payer: Medicare Other

## 2017-06-22 ENCOUNTER — Encounter (INDEPENDENT_AMBULATORY_CARE_PROVIDER_SITE_OTHER): Payer: Self-pay | Admitting: Orthopaedic Surgery

## 2017-06-22 DIAGNOSIS — S72402D Unspecified fracture of lower end of left femur, subsequent encounter for closed fracture with routine healing: Secondary | ICD-10-CM

## 2017-06-22 DIAGNOSIS — S82132A Displaced fracture of medial condyle of left tibia, initial encounter for closed fracture: Secondary | ICD-10-CM | POA: Insufficient documentation

## 2017-06-22 DIAGNOSIS — S82132D Displaced fracture of medial condyle of left tibia, subsequent encounter for closed fracture with routine healing: Secondary | ICD-10-CM

## 2017-06-22 NOTE — Progress Notes (Signed)
Post-Op Visit Note   Patient: Misty [name]           Date of Birth: Jan 15, 1930           MRN: [id number] Visit Date: 06/22/2017 PCP: Richardean Chimera, MD   Assessment & Plan: Follow-up left medial tibial plateau fracture.  We will order some home health physical therapy to start weightbearing with her knee immobilizer on for transfers and short ambulation in the house.  Chief Complaint:  Chief Complaint  Patient presents with  . Left Knee - Fracture, Follow-up   Visit Diagnoses:  1. Closed fracture of distal end of left femur with routine healing, unspecified fracture morphology, subsequent encounter   2. Closed fracture of medial portion of left tibial plateau with routine healing, subsequent encounter     Plan: Start home health physical therapy for mobility.  Follow-Up Instructions: Return in about 2 months (around 08/20/2017).   Orders:  Orders Placed This Encounter  Procedures  . XR Knee 1-2 Views Left   No orders of the defined types were placed in this encounter.   Imaging: Xr Knee 1-2 Views Left  Result Date: 06/22/2017 2 view x-rays left knee obtained.  This shows previous fixation left femur.  Fracture of the medial tibial plateau with some varus deformity.  Interval healing with sclerosis is noted. Impression: Proximal tibial medial plateau fracture with some interval healing.   PMFS History: Patient Active Problem List   Diagnosis Date Noted  . Diabetes mellitus, type 2 (HCC) 07/05/2014  . GERD (gastroesophageal reflux disease) 07/05/2014  . Fracture of femur, distal, left, closed (HCC) 07/05/2014  . Osteoporosis 07/05/2014  . Hyponatremia 07/05/2014  . Anorexia 11/28/2012  . Hypothyroidism 11/28/2012  . Mirizzi's syndrome (h/o post cholecystectomy) 06/27/2011  . Sarcoidosis 06/27/2011   Past Medical History:  Diagnosis Date  . Anorexia   . Biliary obstruction   . Congestive heart failure [facility]) December, 2012   [facility]   . Diabetes mellitus   . GERD (gastroesophageal reflux disease)   . Hypothyroidism   . Jaundice   . Sarcoidosis   . Weight loss     Family History  Problem Relation Age of Onset  . Anesthesia problems Neg Hx   . Hypotension Neg Hx   . Malignant hyperthermia Neg Hx   . Pseudochol deficiency Neg Hx   . Scoliosis Son   . Hypertension Son   . Sinusitis Son   . Healthy Son     Past Surgical History:  Procedure Laterality Date  . APPENDECTOMY    . BILIARY STENT PLACEMENT  02/16/2011   Procedure: BILIARY STENT PLACEMENT;  Surgeon: Malissa Hippo, MD;  Location: [facility];  Service: Endoscopy;  Laterality: N/A;  . BILIARY STENT PLACEMENT  04/20/2011   Procedure: BILIARY STENT PLACEMENT;  Surgeon: Malissa Hippo, MD;  Location: [facility];  Service: Endoscopy;  Laterality: N/A;  . CHOLECYSTECTOMY  04/27/2011   Procedure: LAPAROSCOPIC CHOLECYSTECTOMY WITH INTRAOPERATIVE CHOLANGIOGRAM;  Surgeon: Fabio Bering, MD;  Location: [facility];  Service: General;  Laterality: N/A;  . ERCP  02/16/2011   Procedure: ENDOSCOPIC RETROGRADE CHOLANGIOPANCREATOGRAPHY (ERCP);  Surgeon: Malissa Hippo, MD;  Location: [facility];  Service: Endoscopy;  Laterality: N/A;  7:30/with Stent Removal  . ERCP  10/08/2010  . ERCP  04/20/2011   Procedure: ENDOSCOPIC RETROGRADE CHOLANGIOPANCREATOGRAPHY (ERCP);  Surgeon: Malissa Hippo, MD;  Location: [facility];  Service: Endoscopy;  Laterality: N/A;  removal of stent, removal of  multiple stones with basket  . ERCP  09/14/2011   Procedure: ENDOSCOPIC RETROGRADE CHOLANGIOPANCREATOGRAPHY (ERCP);  Surgeon: Malissa HippoNajeeb U Rehman, MD;  Location: [facility];  Service: Endoscopy;  Laterality: N/A;  ERCP with Stent Removal  . HIP SURGERY Left November 20.2014   Left Hip Fracture- Done @ [facility]  . ORIF FEMUR FRACTURE Left 07/05/2014   Procedure: Left super condylar femur fracture;  Surgeon: Eldred MangesMark C Calbert Hulsebus, MD;  [facility];  Service: Orthopedics;  Laterality: Left;  . SPHINCTEROTOMY  02/16/2011    Procedure: SPHINCTEROTOMY;  Surgeon: Malissa HippoNajeeb U Rehman, MD;  Location: [facility];  Service: Endoscopy;  Laterality: N/A;  . SPHINCTEROTOMY  09/14/2011   Procedure: SPHINCTEROTOMY;  Surgeon: Malissa HippoNajeeb U Rehman, MD;  Location: [facility];  Service: Endoscopy;  Laterality: N/A;  . SPYGLASS CHOLANGIOSCOPY  04/20/2011   Procedure: SPYGLASS CHOLANGIOSCOPY;  Surgeon: Malissa HippoNajeeb U Rehman, MD;  Location: [facility];  Service: Endoscopy;  Laterality: N/A;  . SPYGLASS CHOLANGIOSCOPY  09/14/2011   Procedure: SPYGLASS CHOLANGIOSCOPY;  Surgeon: Malissa HippoNajeeb U Rehman, MD;  Location: [facility];  Service: Endoscopy;  Laterality: N/A;  . VAGINA SURGERY  as a teen   Social History   Occupational History  . Not on file  Tobacco Use  . Smoking status: Never Smoker  . Smokeless tobacco: Never Used  Substance and Sexual Activity  . Alcohol use: No  . Drug use: No  . Sexual activity: Not Currently

## 2017-06-26 ENCOUNTER — Telehealth (INDEPENDENT_AMBULATORY_CARE_PROVIDER_SITE_OTHER): Payer: Self-pay

## 2017-06-26 DIAGNOSIS — Z7984 Long term (current) use of oral hypoglycemic drugs: Secondary | ICD-10-CM | POA: Diagnosis not present

## 2017-06-26 DIAGNOSIS — M810 Age-related osteoporosis without current pathological fracture: Secondary | ICD-10-CM | POA: Diagnosis not present

## 2017-06-26 DIAGNOSIS — Z791 Long term (current) use of non-steroidal anti-inflammatories (NSAID): Secondary | ICD-10-CM | POA: Diagnosis not present

## 2017-06-26 DIAGNOSIS — D869 Sarcoidosis, unspecified: Secondary | ICD-10-CM | POA: Diagnosis not present

## 2017-06-26 DIAGNOSIS — S72402D Unspecified fracture of lower end of left femur, subsequent encounter for closed fracture with routine healing: Secondary | ICD-10-CM | POA: Diagnosis not present

## 2017-06-26 DIAGNOSIS — R630 Anorexia: Secondary | ICD-10-CM | POA: Diagnosis not present

## 2017-06-26 DIAGNOSIS — E119 Type 2 diabetes mellitus without complications: Secondary | ICD-10-CM | POA: Diagnosis not present

## 2017-06-26 DIAGNOSIS — K219 Gastro-esophageal reflux disease without esophagitis: Secondary | ICD-10-CM | POA: Diagnosis not present

## 2017-06-26 DIAGNOSIS — S82142D Displaced bicondylar fracture of left tibia, subsequent encounter for closed fracture with routine healing: Secondary | ICD-10-CM | POA: Diagnosis not present

## 2017-06-26 NOTE — Telephone Encounter (Signed)
ucall please. Read her plan from last OV about therapy thanks. OK . Thank you

## 2017-06-26 NOTE — Telephone Encounter (Signed)
Please advise. PT orders ok?

## 2017-06-26 NOTE — Telephone Encounter (Signed)
Received vm from Alb[place]acy on triage phone. Just needs to confirm plan of care for pt. Wants to start PT 3 x a wk for 4 wks and wants to make sure this is what Dr. Ophelia CharterYates wants. [phone]

## 2017-06-27 NOTE — Telephone Encounter (Signed)
Dr. Ophelia CharterYates spoke with therapist. OK to put on knee immobilizer and transfer, short ambulation in the house. Does not want significant strengthening as patient has osteoporosis with multiple breaks and could cause more damage.

## 2017-06-28 DIAGNOSIS — M810 Age-related osteoporosis without current pathological fracture: Secondary | ICD-10-CM | POA: Diagnosis not present

## 2017-06-28 DIAGNOSIS — R630 Anorexia: Secondary | ICD-10-CM | POA: Diagnosis not present

## 2017-06-28 DIAGNOSIS — S82142D Displaced bicondylar fracture of left tibia, subsequent encounter for closed fracture with routine healing: Secondary | ICD-10-CM | POA: Diagnosis not present

## 2017-06-28 DIAGNOSIS — E119 Type 2 diabetes mellitus without complications: Secondary | ICD-10-CM | POA: Diagnosis not present

## 2017-06-28 DIAGNOSIS — D869 Sarcoidosis, unspecified: Secondary | ICD-10-CM | POA: Diagnosis not present

## 2017-06-28 DIAGNOSIS — S72402D Unspecified fracture of lower end of left femur, subsequent encounter for closed fracture with routine healing: Secondary | ICD-10-CM | POA: Diagnosis not present

## 2017-06-30 DIAGNOSIS — R630 Anorexia: Secondary | ICD-10-CM | POA: Diagnosis not present

## 2017-06-30 DIAGNOSIS — S82142D Displaced bicondylar fracture of left tibia, subsequent encounter for closed fracture with routine healing: Secondary | ICD-10-CM | POA: Diagnosis not present

## 2017-06-30 DIAGNOSIS — S72402D Unspecified fracture of lower end of left femur, subsequent encounter for closed fracture with routine healing: Secondary | ICD-10-CM | POA: Diagnosis not present

## 2017-06-30 DIAGNOSIS — M810 Age-related osteoporosis without current pathological fracture: Secondary | ICD-10-CM | POA: Diagnosis not present

## 2017-06-30 DIAGNOSIS — E119 Type 2 diabetes mellitus without complications: Secondary | ICD-10-CM | POA: Diagnosis not present

## 2017-06-30 DIAGNOSIS — D869 Sarcoidosis, unspecified: Secondary | ICD-10-CM | POA: Diagnosis not present

## 2017-07-03 DIAGNOSIS — D869 Sarcoidosis, unspecified: Secondary | ICD-10-CM | POA: Diagnosis not present

## 2017-07-03 DIAGNOSIS — S72402D Unspecified fracture of lower end of left femur, subsequent encounter for closed fracture with routine healing: Secondary | ICD-10-CM | POA: Diagnosis not present

## 2017-07-03 DIAGNOSIS — S82142D Displaced bicondylar fracture of left tibia, subsequent encounter for closed fracture with routine healing: Secondary | ICD-10-CM | POA: Diagnosis not present

## 2017-07-03 DIAGNOSIS — R630 Anorexia: Secondary | ICD-10-CM | POA: Diagnosis not present

## 2017-07-03 DIAGNOSIS — M810 Age-related osteoporosis without current pathological fracture: Secondary | ICD-10-CM | POA: Diagnosis not present

## 2017-07-03 DIAGNOSIS — E119 Type 2 diabetes mellitus without complications: Secondary | ICD-10-CM | POA: Diagnosis not present

## 2017-07-05 DIAGNOSIS — S82142D Displaced bicondylar fracture of left tibia, subsequent encounter for closed fracture with routine healing: Secondary | ICD-10-CM | POA: Diagnosis not present

## 2017-07-05 DIAGNOSIS — R630 Anorexia: Secondary | ICD-10-CM | POA: Diagnosis not present

## 2017-07-05 DIAGNOSIS — E119 Type 2 diabetes mellitus without complications: Secondary | ICD-10-CM | POA: Diagnosis not present

## 2017-07-05 DIAGNOSIS — S72402D Unspecified fracture of lower end of left femur, subsequent encounter for closed fracture with routine healing: Secondary | ICD-10-CM | POA: Diagnosis not present

## 2017-07-05 DIAGNOSIS — D869 Sarcoidosis, unspecified: Secondary | ICD-10-CM | POA: Diagnosis not present

## 2017-07-05 DIAGNOSIS — M810 Age-related osteoporosis without current pathological fracture: Secondary | ICD-10-CM | POA: Diagnosis not present

## 2017-07-06 DIAGNOSIS — D869 Sarcoidosis, unspecified: Secondary | ICD-10-CM | POA: Diagnosis not present

## 2017-07-06 DIAGNOSIS — S82142D Displaced bicondylar fracture of left tibia, subsequent encounter for closed fracture with routine healing: Secondary | ICD-10-CM | POA: Diagnosis not present

## 2017-07-06 DIAGNOSIS — S72402D Unspecified fracture of lower end of left femur, subsequent encounter for closed fracture with routine healing: Secondary | ICD-10-CM | POA: Diagnosis not present

## 2017-07-06 DIAGNOSIS — M810 Age-related osteoporosis without current pathological fracture: Secondary | ICD-10-CM | POA: Diagnosis not present

## 2017-07-06 DIAGNOSIS — E119 Type 2 diabetes mellitus without complications: Secondary | ICD-10-CM | POA: Diagnosis not present

## 2017-07-06 DIAGNOSIS — R630 Anorexia: Secondary | ICD-10-CM | POA: Diagnosis not present

## 2017-07-10 DIAGNOSIS — M810 Age-related osteoporosis without current pathological fracture: Secondary | ICD-10-CM | POA: Diagnosis not present

## 2017-07-10 DIAGNOSIS — R630 Anorexia: Secondary | ICD-10-CM | POA: Diagnosis not present

## 2017-07-10 DIAGNOSIS — D869 Sarcoidosis, unspecified: Secondary | ICD-10-CM | POA: Diagnosis not present

## 2017-07-10 DIAGNOSIS — S72402D Unspecified fracture of lower end of left femur, subsequent encounter for closed fracture with routine healing: Secondary | ICD-10-CM | POA: Diagnosis not present

## 2017-07-10 DIAGNOSIS — S82142D Displaced bicondylar fracture of left tibia, subsequent encounter for closed fracture with routine healing: Secondary | ICD-10-CM | POA: Diagnosis not present

## 2017-07-10 DIAGNOSIS — E119 Type 2 diabetes mellitus without complications: Secondary | ICD-10-CM | POA: Diagnosis not present

## 2017-07-11 DIAGNOSIS — S72402D Unspecified fracture of lower end of left femur, subsequent encounter for closed fracture with routine healing: Secondary | ICD-10-CM | POA: Diagnosis not present

## 2017-07-11 DIAGNOSIS — D869 Sarcoidosis, unspecified: Secondary | ICD-10-CM | POA: Diagnosis not present

## 2017-07-11 DIAGNOSIS — M810 Age-related osteoporosis without current pathological fracture: Secondary | ICD-10-CM | POA: Diagnosis not present

## 2017-07-11 DIAGNOSIS — E119 Type 2 diabetes mellitus without complications: Secondary | ICD-10-CM | POA: Diagnosis not present

## 2017-07-11 DIAGNOSIS — R630 Anorexia: Secondary | ICD-10-CM | POA: Diagnosis not present

## 2017-07-11 DIAGNOSIS — S82142D Displaced bicondylar fracture of left tibia, subsequent encounter for closed fracture with routine healing: Secondary | ICD-10-CM | POA: Diagnosis not present

## 2017-07-14 DIAGNOSIS — E119 Type 2 diabetes mellitus without complications: Secondary | ICD-10-CM | POA: Diagnosis not present

## 2017-07-14 DIAGNOSIS — D869 Sarcoidosis, unspecified: Secondary | ICD-10-CM | POA: Diagnosis not present

## 2017-07-14 DIAGNOSIS — R630 Anorexia: Secondary | ICD-10-CM | POA: Diagnosis not present

## 2017-07-14 DIAGNOSIS — M810 Age-related osteoporosis without current pathological fracture: Secondary | ICD-10-CM | POA: Diagnosis not present

## 2017-07-14 DIAGNOSIS — S82142D Displaced bicondylar fracture of left tibia, subsequent encounter for closed fracture with routine healing: Secondary | ICD-10-CM | POA: Diagnosis not present

## 2017-07-14 DIAGNOSIS — S72402D Unspecified fracture of lower end of left femur, subsequent encounter for closed fracture with routine healing: Secondary | ICD-10-CM | POA: Diagnosis not present

## 2017-07-17 DIAGNOSIS — S82142D Displaced bicondylar fracture of left tibia, subsequent encounter for closed fracture with routine healing: Secondary | ICD-10-CM | POA: Diagnosis not present

## 2017-07-17 DIAGNOSIS — R630 Anorexia: Secondary | ICD-10-CM | POA: Diagnosis not present

## 2017-07-17 DIAGNOSIS — S72402D Unspecified fracture of lower end of left femur, subsequent encounter for closed fracture with routine healing: Secondary | ICD-10-CM | POA: Diagnosis not present

## 2017-07-17 DIAGNOSIS — E119 Type 2 diabetes mellitus without complications: Secondary | ICD-10-CM | POA: Diagnosis not present

## 2017-07-17 DIAGNOSIS — D869 Sarcoidosis, unspecified: Secondary | ICD-10-CM | POA: Diagnosis not present

## 2017-07-17 DIAGNOSIS — M810 Age-related osteoporosis without current pathological fracture: Secondary | ICD-10-CM | POA: Diagnosis not present

## 2017-07-19 DIAGNOSIS — E119 Type 2 diabetes mellitus without complications: Secondary | ICD-10-CM | POA: Diagnosis not present

## 2017-07-19 DIAGNOSIS — R630 Anorexia: Secondary | ICD-10-CM | POA: Diagnosis not present

## 2017-07-19 DIAGNOSIS — D869 Sarcoidosis, unspecified: Secondary | ICD-10-CM | POA: Diagnosis not present

## 2017-07-19 DIAGNOSIS — M810 Age-related osteoporosis without current pathological fracture: Secondary | ICD-10-CM | POA: Diagnosis not present

## 2017-07-19 DIAGNOSIS — S72402D Unspecified fracture of lower end of left femur, subsequent encounter for closed fracture with routine healing: Secondary | ICD-10-CM | POA: Diagnosis not present

## 2017-07-19 DIAGNOSIS — S82142D Displaced bicondylar fracture of left tibia, subsequent encounter for closed fracture with routine healing: Secondary | ICD-10-CM | POA: Diagnosis not present

## 2017-07-21 DIAGNOSIS — R630 Anorexia: Secondary | ICD-10-CM | POA: Diagnosis not present

## 2017-07-21 DIAGNOSIS — E119 Type 2 diabetes mellitus without complications: Secondary | ICD-10-CM | POA: Diagnosis not present

## 2017-07-21 DIAGNOSIS — S82142D Displaced bicondylar fracture of left tibia, subsequent encounter for closed fracture with routine healing: Secondary | ICD-10-CM | POA: Diagnosis not present

## 2017-07-21 DIAGNOSIS — M810 Age-related osteoporosis without current pathological fracture: Secondary | ICD-10-CM | POA: Diagnosis not present

## 2017-07-21 DIAGNOSIS — D869 Sarcoidosis, unspecified: Secondary | ICD-10-CM | POA: Diagnosis not present

## 2017-07-21 DIAGNOSIS — S72402D Unspecified fracture of lower end of left femur, subsequent encounter for closed fracture with routine healing: Secondary | ICD-10-CM | POA: Diagnosis not present

## 2017-08-24 ENCOUNTER — Encounter (INDEPENDENT_AMBULATORY_CARE_PROVIDER_SITE_OTHER): Payer: Self-pay | Admitting: Orthopaedic Surgery

## 2017-08-24 ENCOUNTER — Ambulatory Visit (INDEPENDENT_AMBULATORY_CARE_PROVIDER_SITE_OTHER): Payer: Medicare Other

## 2017-08-24 ENCOUNTER — Ambulatory Visit (INDEPENDENT_AMBULATORY_CARE_PROVIDER_SITE_OTHER): Payer: Medicare Other | Admitting: Orthopaedic Surgery

## 2017-08-24 VITALS — BP 128/90 | HR 94 | Ht 61.0 in | Wt <= 1120 oz

## 2017-08-24 DIAGNOSIS — S72402D Unspecified fracture of lower end of left femur, subsequent encounter for closed fracture with routine healing: Secondary | ICD-10-CM

## 2017-08-24 DIAGNOSIS — M25572 Pain in left ankle and joints of left foot: Secondary | ICD-10-CM | POA: Diagnosis not present

## 2017-08-24 NOTE — Progress Notes (Signed)
Office Visit Note   Patient: Misty [name]           Date of Birth: Jan 19, 1930           MRN: [id number] Visit Date: 08/24/2017              Requested by: Richardean Chimeraaniel, Terry G, MD [address] [place], [place] [id number] PCP: Richardean Chimeraaniel, Terry G, MD   Assessment & Plan: Visit Diagnoses:  1. Closed fracture of distal end of left femur with routine healing, unspecified fracture morphology, subsequent encounter   2. Pain in left ankle and joints of left foot     Plan: Left medial tibial plateau fracture is healed.  He said some anterolateral swelling over ankle with slight erythema over the fibula x-rays are negative for fracture noted to her ankle effusion this does not appear to be gout..  Elevate her foot as needed.  On Fosamax needs to take it for 18 months and stop for 6 months then we will OBTAIN a bone density test.  Follow-Up Instructions: Return if symptoms worsen or fail to improve.   Orders:  Orders Placed This Encounter  Procedures  . XR Knee 1-2 Views Left  . XR Ankle Complete Left   No orders of the defined types were placed in this encounter.     Procedures: No procedures performed   Clinical Data: No additional findings.   Subjective: Chief Complaint  Patient presents with  . Left Knee - Fracture, Follow-up    HPI patient returns states she had a spell a week ago where she had significant lateral ankle pain stumbled but did not fall and had anterolateral pain.  Been noted.  She has some erythema both lower extremities with some venous stasis changes.  Review of Systems symptoms updated unchanged other than as mentioned above.   Objective: Vital Signs: BP 128/90   Pulse 94   Ht 5\' 1"  (1.549 m)   Wt 64 lb (29 kg)   BMI 12.09 kg/m   Physical Exam  Constitutional: She is oriented to person, place, and time.  Elderly frail appearance alert conversant.  HENT:  Head: Normocephalic.  Right Ear: External ear normal.  Left Ear: External ear normal.  Eyes: Pupils  are equal, round, and reactive to light.  Neck: No tracheal deviation present. No thyromegaly present.  Cardiovascular: Normal rate.  Pulmonary/Chest: Effort normal.  Abdominal: Soft.  Neurological: She is alert and oriented to person, place, and time.  Skin: Skin is warm and dry.  Psychiatric: She has a normal mood and affect. Her behavior is normal.    Ortho Exam tenderness over the anterior talofibular ligament negative anterior drawer no synovitis medially synovium anteriorly.  Peroneal is active.  Anterior tib toe extensors are active good gastrocsoleus strength.  Normal peroneal tendons with palpation.  Specialty Comments:  No specialty comments available.  Imaging: Xr Ankle Complete Left  Result Date: 08/24/2017 Three-view x-rays left ankle obtained and reviewed.  This shows osteopenia.  Negative for acute injury.  Area of soft tissue swelling laterally over the ankle is negative for lateral malleolar fracture. Impression: Normal left ankle x-rays other than osteopenia negative for acute fracture.  Xr Knee 1-2 Views Left  Result Date: 08/24/2017 [facility] lateral left knee obtained and reviewed.  This shows healed medial tibial plateau fracture with varus.  Depression of the medial side of the joint with healed fracture. Impression: Healed medial tibial plateau fracture.  Previous hardware noted distal femur with healed distal  femoral supracondylar fracture.    PMFS History: Patient Active Problem List   Diagnosis Date Noted  . Closed fracture of medial plateau of left tibia 06/22/2017  . Diabetes mellitus, type 2 (HCC) 07/05/2014  . GERD (gastroesophageal reflux disease) 07/05/2014  . Fracture of femur, distal, left, closed (HCC) 07/05/2014  . Osteoporosis 07/05/2014  . Hyponatremia 07/05/2014  . Anorexia 11/28/2012  . Hypothyroidism 11/28/2012  . Mirizzi's syndrome (h/o post cholecystectomy) 06/27/2011  . Sarcoidosis 06/27/2011   Past Medical History:  Diagnosis Date  .  Anorexia   . Biliary obstruction   . Congestive heart failure [facility] 2012   [facility]  . Diabetes mellitus   . GERD (gastroesophageal reflux disease)   . Hypothyroidism   . Jaundice   . Sarcoidosis   . Weight loss     Family History  Problem Relation Age of Onset  . Anesthesia problems Neg Hx   . Hypotension Neg Hx   . Malignant hyperthermia Neg Hx   . Pseudochol deficiency Neg Hx   . Scoliosis Son   . Hypertension Son   . Sinusitis Son   . Healthy Son     Past Surgical History:  Procedure Laterality Date  . APPENDECTOMY    . BILIARY STENT PLACEMENT  02/16/2011   Procedure: BILIARY STENT PLACEMENT;  Surgeon: Malissa Hippo, MD;  Location: [facility];  Service: Endoscopy;  Laterality: N/A;  . BILIARY STENT PLACEMENT  04/20/2011   Procedure: BILIARY STENT PLACEMENT;  Surgeon: Malissa Hippo, MD;  Location: [facility];  Service: Endoscopy;  Laterality: N/A;  . CHOLECYSTECTOMY  04/27/2011   Procedure: LAPAROSCOPIC CHOLECYSTECTOMY WITH INTRAOPERATIVE CHOLANGIOGRAM;  Surgeon: Fabio Bering, MD;  Location: [facility];  Service: General;  Laterality: N/A;  . ERCP  02/16/2011   Procedure: ENDOSCOPIC RETROGRADE CHOLANGIOPANCREATOGRAPHY (ERCP);  Surgeon: Malissa Hippo, MD;  Location: [facility];  Service: Endoscopy;  Laterality: N/A;  7:30/with Stent Removal  . ERCP  10/08/2010  . ERCP  04/20/2011   Procedure: ENDOSCOPIC RETROGRADE CHOLANGIOPANCREATOGRAPHY (ERCP);  Surgeon: Malissa Hippo, MD;  Location: [facility];  Service: Endoscopy;  Laterality: N/A;  removal of stent, removal of multiple stones with basket  . ERCP  09/14/2011   Procedure: ENDOSCOPIC RETROGRADE CHOLANGIOPANCREATOGRAPHY (ERCP);  Surgeon: Malissa Hippo, MD;  Location: [facility];  Service: Endoscopy;  Laterality: N/A;  ERCP with Stent Removal  . HIP SURGERY Left November 20.2014   Left Hip Fracture- Done @ [facility]  . ORIF FEMUR FRACTURE Left 07/05/2014   Procedure: Left super condylar femur fracture;   Surgeon: Eldred Manges, MD;  Location: [facility] OR;  Service: Orthopedics;  Laterality: Left;  . SPHINCTEROTOMY  02/16/2011   Procedure: SPHINCTEROTOMY;  Surgeon: Malissa Hippo, MD;  Location: [facility];  Service: Endoscopy;  Laterality: N/A;  . SPHINCTEROTOMY  09/14/2011   Procedure: SPHINCTEROTOMY;  Surgeon: Malissa Hippo, MD;  Location: [facility];  Service: Endoscopy;  Laterality: N/A;  . SPYGLASS CHOLANGIOSCOPY  04/20/2011   Procedure: SPYGLASS CHOLANGIOSCOPY;  Surgeon: Malissa Hippo, MD;  Location: [facility];  Service: Endoscopy;  Laterality: N/A;  . SPYGLASS CHOLANGIOSCOPY  09/14/2011   Procedure: SPYGLASS CHOLANGIOSCOPY;  Surgeon: Malissa Hippo, MD;  Location: [facility];  Service: Endoscopy;  Laterality: N/A;  . VAGINA SURGERY  as a teen   Social History   Occupational History  . Not on file  Tobacco Use  . Smoking status: Never Smoker  . Smokeless tobacco: Never Used  Substance  and Sexual Activity  . Alcohol use: No  . Drug use: No  . Sexual activity: Not Currently

## 2017-08-31 DIAGNOSIS — I739 Peripheral vascular disease, unspecified: Secondary | ICD-10-CM | POA: Diagnosis not present

## 2017-08-31 DIAGNOSIS — E782 Mixed hyperlipidemia: Secondary | ICD-10-CM | POA: Diagnosis not present

## 2017-08-31 DIAGNOSIS — Z681 Body mass index (BMI) 19 or less, adult: Secondary | ICD-10-CM | POA: Diagnosis not present

## 2017-08-31 DIAGNOSIS — E039 Hypothyroidism, unspecified: Secondary | ICD-10-CM | POA: Diagnosis not present

## 2017-08-31 DIAGNOSIS — E440 Moderate protein-calorie malnutrition: Secondary | ICD-10-CM | POA: Diagnosis not present

## 2017-08-31 DIAGNOSIS — J449 Chronic obstructive pulmonary disease, unspecified: Secondary | ICD-10-CM | POA: Diagnosis not present

## 2017-08-31 DIAGNOSIS — M810 Age-related osteoporosis without current pathological fracture: Secondary | ICD-10-CM | POA: Diagnosis not present

## 2017-08-31 DIAGNOSIS — E1165 Type 2 diabetes mellitus with hyperglycemia: Secondary | ICD-10-CM | POA: Diagnosis not present

## 2017-10-25 DIAGNOSIS — L110 Acquired keratosis follicularis: Secondary | ICD-10-CM | POA: Diagnosis not present

## 2017-10-25 DIAGNOSIS — E1142 Type 2 diabetes mellitus with diabetic polyneuropathy: Secondary | ICD-10-CM | POA: Diagnosis not present

## 2017-10-25 DIAGNOSIS — E1140 Type 2 diabetes mellitus with diabetic neuropathy, unspecified: Secondary | ICD-10-CM | POA: Diagnosis not present

## 2017-12-22 DIAGNOSIS — E1165 Type 2 diabetes mellitus with hyperglycemia: Secondary | ICD-10-CM | POA: Diagnosis not present

## 2017-12-22 DIAGNOSIS — E782 Mixed hyperlipidemia: Secondary | ICD-10-CM | POA: Diagnosis not present

## 2017-12-22 DIAGNOSIS — J449 Chronic obstructive pulmonary disease, unspecified: Secondary | ICD-10-CM | POA: Diagnosis not present

## 2017-12-22 DIAGNOSIS — R5383 Other fatigue: Secondary | ICD-10-CM | POA: Diagnosis not present

## 2017-12-22 DIAGNOSIS — R300 Dysuria: Secondary | ICD-10-CM | POA: Diagnosis not present

## 2017-12-22 DIAGNOSIS — Z9189 Other specified personal risk factors, not elsewhere classified: Secondary | ICD-10-CM | POA: Diagnosis not present

## 2017-12-22 DIAGNOSIS — E039 Hypothyroidism, unspecified: Secondary | ICD-10-CM | POA: Diagnosis not present

## 2017-12-27 DIAGNOSIS — Z681 Body mass index (BMI) 19 or less, adult: Secondary | ICD-10-CM | POA: Diagnosis not present

## 2017-12-27 DIAGNOSIS — Z0001 Encounter for general adult medical examination with abnormal findings: Secondary | ICD-10-CM | POA: Diagnosis not present

## 2017-12-27 DIAGNOSIS — E1165 Type 2 diabetes mellitus with hyperglycemia: Secondary | ICD-10-CM | POA: Diagnosis not present

## 2017-12-27 DIAGNOSIS — E039 Hypothyroidism, unspecified: Secondary | ICD-10-CM | POA: Diagnosis not present

## 2018-06-01 DIAGNOSIS — R300 Dysuria: Secondary | ICD-10-CM | POA: Diagnosis not present

## 2018-07-13 DIAGNOSIS — J9602 Acute respiratory failure with hypercapnia: Secondary | ICD-10-CM | POA: Diagnosis not present

## 2018-07-13 DIAGNOSIS — R791 Abnormal coagulation profile: Secondary | ICD-10-CM | POA: Diagnosis not present

## 2018-07-13 DIAGNOSIS — J9601 Acute respiratory failure with hypoxia: Secondary | ICD-10-CM | POA: Diagnosis not present

## 2018-07-13 DIAGNOSIS — E871 Hypo-osmolality and hyponatremia: Secondary | ICD-10-CM | POA: Diagnosis not present

## 2018-07-13 DIAGNOSIS — J690 Pneumonitis due to inhalation of food and vomit: Secondary | ICD-10-CM | POA: Diagnosis not present

## 2018-07-13 DIAGNOSIS — M25561 Pain in right knee: Secondary | ICD-10-CM | POA: Diagnosis not present

## 2018-07-13 DIAGNOSIS — R0600 Dyspnea, unspecified: Secondary | ICD-10-CM | POA: Diagnosis not present

## 2018-07-13 DIAGNOSIS — R0602 Shortness of breath: Secondary | ICD-10-CM | POA: Diagnosis not present

## 2018-07-13 DIAGNOSIS — R000 Tachycardia, unspecified: Secondary | ICD-10-CM | POA: Diagnosis not present

## 2018-07-13 DIAGNOSIS — M1711 Unilateral primary osteoarthritis, right knee: Secondary | ICD-10-CM | POA: Diagnosis not present

## 2018-07-13 DIAGNOSIS — E440 Moderate protein-calorie malnutrition: Secondary | ICD-10-CM | POA: Diagnosis not present

## 2018-07-13 DIAGNOSIS — R0689 Other abnormalities of breathing: Secondary | ICD-10-CM | POA: Diagnosis not present

## 2018-07-13 DIAGNOSIS — R0902 Hypoxemia: Secondary | ICD-10-CM | POA: Diagnosis not present

## 2018-07-13 DIAGNOSIS — Z681 Body mass index (BMI) 19 or less, adult: Secondary | ICD-10-CM | POA: Diagnosis not present

## 2018-07-13 DIAGNOSIS — E1165 Type 2 diabetes mellitus with hyperglycemia: Secondary | ICD-10-CM | POA: Diagnosis not present

## 2018-07-13 DIAGNOSIS — K449 Diaphragmatic hernia without obstruction or gangrene: Secondary | ICD-10-CM | POA: Diagnosis not present

## 2018-07-13 DIAGNOSIS — R062 Wheezing: Secondary | ICD-10-CM | POA: Diagnosis not present

## 2018-07-14 DIAGNOSIS — J690 Pneumonitis due to inhalation of food and vomit: Secondary | ICD-10-CM | POA: Diagnosis not present

## 2018-07-14 DIAGNOSIS — R0602 Shortness of breath: Secondary | ICD-10-CM | POA: Diagnosis not present

## 2018-07-14 DIAGNOSIS — Z66 Do not resuscitate: Secondary | ICD-10-CM | POA: Diagnosis present

## 2018-07-14 DIAGNOSIS — E039 Hypothyroidism, unspecified: Secondary | ICD-10-CM | POA: Diagnosis present

## 2018-07-14 DIAGNOSIS — E1142 Type 2 diabetes mellitus with diabetic polyneuropathy: Secondary | ICD-10-CM | POA: Diagnosis present

## 2018-07-14 DIAGNOSIS — Z882 Allergy status to sulfonamides status: Secondary | ICD-10-CM | POA: Diagnosis not present

## 2018-07-14 DIAGNOSIS — J479 Bronchiectasis, uncomplicated: Secondary | ICD-10-CM | POA: Diagnosis present

## 2018-07-14 DIAGNOSIS — E1165 Type 2 diabetes mellitus with hyperglycemia: Secondary | ICD-10-CM | POA: Diagnosis present

## 2018-07-14 DIAGNOSIS — K449 Diaphragmatic hernia without obstruction or gangrene: Secondary | ICD-10-CM | POA: Diagnosis present

## 2018-07-14 DIAGNOSIS — J9602 Acute respiratory failure with hypercapnia: Secondary | ICD-10-CM | POA: Diagnosis present

## 2018-07-14 DIAGNOSIS — J9601 Acute respiratory failure with hypoxia: Secondary | ICD-10-CM | POA: Diagnosis not present

## 2018-07-14 DIAGNOSIS — E440 Moderate protein-calorie malnutrition: Secondary | ICD-10-CM | POA: Diagnosis present

## 2018-07-14 DIAGNOSIS — M1990 Unspecified osteoarthritis, unspecified site: Secondary | ICD-10-CM | POA: Diagnosis present

## 2018-07-14 DIAGNOSIS — Z681 Body mass index (BMI) 19 or less, adult: Secondary | ICD-10-CM | POA: Diagnosis not present

## 2018-07-14 DIAGNOSIS — F411 Generalized anxiety disorder: Secondary | ICD-10-CM | POA: Diagnosis present

## 2018-07-14 DIAGNOSIS — E871 Hypo-osmolality and hyponatremia: Secondary | ICD-10-CM | POA: Diagnosis present

## 2018-07-14 DIAGNOSIS — I700 Atherosclerosis of aorta: Secondary | ICD-10-CM | POA: Diagnosis present

## 2018-07-14 DIAGNOSIS — Z79899 Other long term (current) drug therapy: Secondary | ICD-10-CM | POA: Diagnosis not present

## 2018-07-14 DIAGNOSIS — E11649 Type 2 diabetes mellitus with hypoglycemia without coma: Secondary | ICD-10-CM | POA: Diagnosis not present

## 2018-08-01 DEATH — deceased
